# Patient Record
Sex: Male | Born: 1960 | Race: White | Hispanic: No | Marital: Single | Smoking: Current every day smoker
Health system: Southern US, Community
[De-identification: ages and names within clinical notes are randomized; demographics above are authoritative.]

## PROBLEM LIST (undated history)

## (undated) ENCOUNTER — Emergency Department (HOSPITAL_COMMUNITY): Discharge status: Absent without leave | Payer: Self-pay

## (undated) VITALS — BP 112/80 | HR 95 | Temp 97.9°F | Resp 18 | Ht 71.0 in | Wt 157.3 lb

## (undated) DIAGNOSIS — I639 Cerebral infarction, unspecified: Secondary | ICD-10-CM

## (undated) DIAGNOSIS — Z9119 Patient's noncompliance with other medical treatment and regimen: Secondary | ICD-10-CM

## (undated) DIAGNOSIS — F1410 Cocaine abuse, uncomplicated: Secondary | ICD-10-CM

## (undated) DIAGNOSIS — F1010 Alcohol abuse, uncomplicated: Secondary | ICD-10-CM

## (undated) DIAGNOSIS — Z91199 Patient's noncompliance with other medical treatment and regimen due to unspecified reason: Secondary | ICD-10-CM

## (undated) DIAGNOSIS — J939 Pneumothorax, unspecified: Secondary | ICD-10-CM

## (undated) DIAGNOSIS — I10 Essential (primary) hypertension: Secondary | ICD-10-CM

## (undated) DIAGNOSIS — I509 Heart failure, unspecified: Secondary | ICD-10-CM

## (undated) DIAGNOSIS — I2510 Atherosclerotic heart disease of native coronary artery without angina pectoris: Secondary | ICD-10-CM

## (undated) DIAGNOSIS — I219 Acute myocardial infarction, unspecified: Secondary | ICD-10-CM

## (undated) HISTORY — PX: SPLENECTOMY: SUR1306

## (undated) HISTORY — PX: LIVER SURGERY: SHX698

## (undated) HISTORY — PX: COLON SURGERY: SHX602

## (undated) HISTORY — PX: ABDOMINAL SURGERY: SHX537

## (undated) HISTORY — DX: Heart failure, unspecified: I50.9

---

## 2000-11-04 ENCOUNTER — Emergency Department (HOSPITAL_COMMUNITY): Admission: EM | Admit: 2000-11-04 | Discharge: 2000-11-04 | Payer: Self-pay | Admitting: Emergency Medicine

## 2000-11-04 ENCOUNTER — Encounter: Payer: Self-pay | Admitting: Emergency Medicine

## 2002-07-21 ENCOUNTER — Emergency Department (HOSPITAL_COMMUNITY): Admission: EM | Admit: 2002-07-21 | Discharge: 2002-07-21 | Payer: Self-pay | Admitting: *Deleted

## 2002-07-21 ENCOUNTER — Encounter: Payer: Self-pay | Admitting: *Deleted

## 2008-07-20 ENCOUNTER — Ambulatory Visit: Payer: Self-pay | Admitting: Cardiology

## 2011-05-02 ENCOUNTER — Emergency Department (HOSPITAL_COMMUNITY)
Admission: EM | Admit: 2011-05-02 | Discharge: 2011-05-03 | Disposition: A | Discharge status: Home / Self Care | Payer: Self-pay | Attending: Emergency Medicine | Admitting: Emergency Medicine

## 2011-05-02 ENCOUNTER — Encounter (HOSPITAL_COMMUNITY): Payer: Self-pay

## 2011-05-02 DIAGNOSIS — F1010 Alcohol abuse, uncomplicated: Secondary | ICD-10-CM | POA: Insufficient documentation

## 2011-05-02 DIAGNOSIS — R5381 Other malaise: Secondary | ICD-10-CM | POA: Insufficient documentation

## 2011-05-02 DIAGNOSIS — Z8673 Personal history of transient ischemic attack (TIA), and cerebral infarction without residual deficits: Secondary | ICD-10-CM | POA: Insufficient documentation

## 2011-05-02 DIAGNOSIS — R209 Unspecified disturbances of skin sensation: Secondary | ICD-10-CM | POA: Insufficient documentation

## 2011-05-02 DIAGNOSIS — F17200 Nicotine dependence, unspecified, uncomplicated: Secondary | ICD-10-CM | POA: Insufficient documentation

## 2011-05-02 DIAGNOSIS — R51 Headache: Secondary | ICD-10-CM | POA: Insufficient documentation

## 2011-05-02 DIAGNOSIS — I252 Old myocardial infarction: Secondary | ICD-10-CM | POA: Insufficient documentation

## 2011-05-02 DIAGNOSIS — R42 Dizziness and giddiness: Secondary | ICD-10-CM | POA: Insufficient documentation

## 2011-05-02 DIAGNOSIS — Z9089 Acquired absence of other organs: Secondary | ICD-10-CM | POA: Insufficient documentation

## 2011-05-02 DIAGNOSIS — R55 Syncope and collapse: Secondary | ICD-10-CM | POA: Insufficient documentation

## 2011-05-02 HISTORY — DX: Cerebral infarction, unspecified: I63.9

## 2011-05-02 HISTORY — DX: Acute myocardial infarction, unspecified: I21.9

## 2011-05-02 NOTE — ED Notes (Signed)
Pt states he can normally drink 48 beers in one day and since Tuesday he is only able to drink 4-6 beers and then feels drunk.   Pt states he has also been dizzy and his left arm feels "cold" at times.

## 2011-05-03 ENCOUNTER — Emergency Department (HOSPITAL_COMMUNITY): Payer: Self-pay

## 2011-05-03 LAB — CBC
HCT: 44.1 % (ref 39.0–52.0)
Hemoglobin: 15.3 g/dL (ref 13.0–17.0)
MCH: 31.7 pg (ref 26.0–34.0)
MCHC: 34.7 g/dL (ref 30.0–36.0)
MCV: 91.5 fL (ref 78.0–100.0)
Platelets: 149 10*3/uL — ABNORMAL LOW (ref 150–400)
RBC: 4.82 MIL/uL (ref 4.22–5.81)
RDW: 13.1 % (ref 11.5–15.5)
WBC: 5.4 10*3/uL (ref 4.0–10.5)

## 2011-05-03 LAB — DIFFERENTIAL
Basophils Absolute: 0 10*3/uL (ref 0.0–0.1)
Basophils Relative: 0 % (ref 0–1)
Eosinophils Absolute: 0.2 10*3/uL (ref 0.0–0.7)
Eosinophils Relative: 3 % (ref 0–5)
Lymphocytes Relative: 33 % (ref 12–46)
Lymphs Abs: 1.8 10*3/uL (ref 0.7–4.0)
Monocytes Absolute: 0.5 10*3/uL (ref 0.1–1.0)
Monocytes Relative: 10 % (ref 3–12)
Neutro Abs: 2.9 10*3/uL (ref 1.7–7.7)
Neutrophils Relative %: 54 % (ref 43–77)

## 2011-05-03 LAB — COMPREHENSIVE METABOLIC PANEL
ALT: 7 U/L (ref 0–53)
AST: 16 U/L (ref 0–37)
Albumin: 3.7 g/dL (ref 3.5–5.2)
Alkaline Phosphatase: 80 U/L (ref 39–117)
BUN: 10 mg/dL (ref 6–23)
CO2: 23 mEq/L (ref 19–32)
Calcium: 9.7 mg/dL (ref 8.4–10.5)
Chloride: 104 mEq/L (ref 96–112)
Creatinine, Ser: 0.86 mg/dL (ref 0.50–1.35)
GFR calc Af Amer: >90 mL/min (ref >90)
GFR calc non Af Amer: >90 mL/min (ref >90)
Glucose, Bld: 100 mg/dL — ABNORMAL HIGH (ref 70–99)
Potassium: 3.7 mEq/L (ref 3.5–5.1)
Sodium: 140 mEq/L (ref 135–145)
Total Bilirubin: 0.3 mg/dL (ref 0.3–1.2)
Total Protein: 7.1 g/dL (ref 6.0–8.3)

## 2011-05-03 LAB — ETHANOL: Alcohol, Ethyl (B): 200 mg/dL — ABNORMAL HIGH (ref 0–11)

## 2011-05-03 NOTE — ED Notes (Signed)
Pt transported to radiology.

## 2011-05-03 NOTE — ED Provider Notes (Signed)
History     CSN: [id number]  Arrival date & time 05/02/11  2334   First MD Initiated Contact with Patient 05/02/11 2353      Chief Complaint  Patient presents with  . Near Syncope    (Consider location/radiation/quality/duration/timing/severity/associated sxs/prior treatment) Patient is a 51 y.o. male presenting with weakness. The history is provided by the patient (the patient states that when he drinks about 8 beers he becomes weak and dizzy. Feels like he is drunkpatient states that he drinks about 48 beers a day. He states that he's been doing this for years. By then her last). No language interpreter was used.  Weakness The primary symptoms include headaches, dizziness and paresthesias. Primary symptoms do not include seizures or visual change. The symptoms began more than 1 week ago. The symptoms are unchanged. The neurological symptoms are diffuse. The symptoms occurred after standing up.  The headache is associated with paresthesias and weakness. The headache is not associated with visual change.   Dizziness also occurs with weakness.  Additional symptoms include weakness. Additional symptoms do not include hallucinations. Medical issues do not include seizures or cerebral vascular accident. Workup history does not include MRI.    Past Medical History  Diagnosis Date  . Stroke   . Myocardial infarction     Past Surgical History  Procedure Date  . Splenectomy   . Abdominal surgery   . Colon surgery     No family history on file.  History  Substance Use Topics  . Smoking status: Current Everyday Smoker -- 2.0 packs/day  . Smokeless tobacco: Not on file  . Alcohol Use: 60.0 oz/week    100 Cans of beer per week      Review of Systems  Constitutional: Negative for fatigue.  HENT: Negative for congestion, sinus pressure and ear discharge.   Eyes: Negative for discharge.  Respiratory: Negative for cough.   Cardiovascular: Negative for chest pain.    Gastrointestinal: Negative for abdominal pain and diarrhea.  Genitourinary: Negative for frequency and hematuria.  Musculoskeletal: Negative for back pain.  Skin: Negative for rash.  Neurological: Positive for dizziness, weakness, headaches and paresthesias. Negative for seizures.  Hematological: Negative.   Psychiatric/Behavioral: Negative for hallucinations.    Allergies  Review of patient's allergies indicates no known allergies.  Home Medications  No current outpatient prescriptions on file.  BP 148/99  Pulse 86  Temp(Src) 97.9 F (36.6 C) (Oral)  Resp 18  Ht 5' 11.5" (1.816 m)  Wt 160 lb (72.576 kg)  BMI 22.00 kg/m2  SpO2 99%  Physical Exam  Constitutional: He is oriented to person, place, and time. He appears well-developed.  HENT:  Head: Normocephalic and atraumatic.  Eyes: Conjunctivae and EOM are normal. No scleral icterus.  Neck: Neck supple. No thyromegaly present.  Cardiovascular: Normal rate and regular rhythm.  Exam reveals no gallop and no friction rub.   No murmur heard. Pulmonary/Chest: No stridor. He has no wheezes. He has no rales. He exhibits no tenderness.  Abdominal: He exhibits no distension. There is no tenderness. There is no rebound.  Musculoskeletal: Normal range of motion. He exhibits no edema.  Lymphadenopathy:    He has no cervical adenopathy.  Neurological: He is oriented to person, place, and time. Coordination normal.  Skin: No rash noted. No erythema.  Psychiatric: He has a normal mood and affect. His behavior is normal.    ED Course  Procedures (including critical care time)  Labs Reviewed  CBC - Abnormal; Notable for  the following:    Platelets 149 (*)    All other components within normal limits  COMPREHENSIVE METABOLIC PANEL - Abnormal; Notable for the following:    Glucose, Bld 100 (*)    All other components within normal limits  ETHANOL - Abnormal; Notable for the following:    Alcohol, Ethyl (B) 200 (*)    All other  components within normal limits  DIFFERENTIAL   Ct Head Wo Contrast  05/03/2011  *RADIOLOGY REPORT*  Clinical Data: Near-syncope  CT HEAD WITHOUT CONTRAST  Technique:  Contiguous axial images were obtained from the base of the skull through the vertex without contrast.  Comparison: None  Findings: Normal ventricular morphology. No midline shift or mass effect. Normal appearance of brain parenchyma. No intracranial hemorrhage, mass lesion, or acute infarction. Visualized paranasal sinuses and mastoid air cells clear. Bones unremarkable.  IMPRESSION: No acute intracranial abnormalities.  Original Report Authenticated By: Lollie Marrow, M.D.     1. Alcohol abuse       MDM  Alcohol abuse.           Benny Lennert, MD 05/03/11 [phone]

## 2013-03-07 ENCOUNTER — Inpatient Hospital Stay (HOSPITAL_COMMUNITY)
Admission: EM | Admit: 2013-03-07 | Discharge: 2013-03-09 | DRG: 287 | Disposition: A | Discharge status: Home / Self Care | Payer: Self-pay | Attending: Internal Medicine | Admitting: Internal Medicine

## 2013-03-07 ENCOUNTER — Emergency Department (HOSPITAL_COMMUNITY): Payer: Self-pay

## 2013-03-07 ENCOUNTER — Other Ambulatory Visit: Payer: Self-pay

## 2013-03-07 ENCOUNTER — Encounter (HOSPITAL_COMMUNITY): Payer: Self-pay | Admitting: Emergency Medicine

## 2013-03-07 DIAGNOSIS — R079 Chest pain, unspecified: Secondary | ICD-10-CM | POA: Diagnosis present

## 2013-03-07 DIAGNOSIS — R634 Abnormal weight loss: Secondary | ICD-10-CM

## 2013-03-07 DIAGNOSIS — I200 Unstable angina: Secondary | ICD-10-CM | POA: Diagnosis present

## 2013-03-07 DIAGNOSIS — I426 Alcoholic cardiomyopathy: Secondary | ICD-10-CM

## 2013-03-07 DIAGNOSIS — Z79899 Other long term (current) drug therapy: Secondary | ICD-10-CM

## 2013-03-07 DIAGNOSIS — Z8673 Personal history of transient ischemic attack (TIA), and cerebral infarction without residual deficits: Secondary | ICD-10-CM

## 2013-03-07 DIAGNOSIS — Z720 Tobacco use: Secondary | ICD-10-CM | POA: Diagnosis present

## 2013-03-07 DIAGNOSIS — I2510 Atherosclerotic heart disease of native coronary artery without angina pectoris: Principal | ICD-10-CM | POA: Diagnosis present

## 2013-03-07 DIAGNOSIS — I498 Other specified cardiac arrhythmias: Secondary | ICD-10-CM | POA: Diagnosis present

## 2013-03-07 DIAGNOSIS — F1010 Alcohol abuse, uncomplicated: Secondary | ICD-10-CM | POA: Diagnosis present

## 2013-03-07 DIAGNOSIS — F17200 Nicotine dependence, unspecified, uncomplicated: Secondary | ICD-10-CM | POA: Diagnosis present

## 2013-03-07 DIAGNOSIS — I252 Old myocardial infarction: Secondary | ICD-10-CM

## 2013-03-07 DIAGNOSIS — Z8249 Family history of ischemic heart disease and other diseases of the circulatory system: Secondary | ICD-10-CM

## 2013-03-07 DIAGNOSIS — Z23 Encounter for immunization: Secondary | ICD-10-CM

## 2013-03-07 LAB — CBC WITH DIFFERENTIAL/PLATELET
Eosinophils Absolute: 0.1 10*3/uL (ref 0.0–0.7)
Eosinophils Relative: 3 % (ref 0–5)
HCT: 44.6 % (ref 39.0–52.0)
Hemoglobin: 15.5 g/dL (ref 13.0–17.0)
Lymphs Abs: 1 10*3/uL (ref 0.7–4.0)
MCH: 32 pg (ref 26.0–34.0)
MCV: 92 fL (ref 78.0–100.0)
Monocytes Absolute: 0.4 10*3/uL (ref 0.1–1.0)
Monocytes Relative: 9 % (ref 3–12)
Platelets: 149 10*3/uL — ABNORMAL LOW (ref 150–400)
RBC: 4.85 MIL/uL (ref 4.22–5.81)

## 2013-03-07 LAB — COMPREHENSIVE METABOLIC PANEL
AST: 16 U/L (ref 0–37)
Albumin: 3.8 g/dL (ref 3.5–5.2)
Chloride: 102 mEq/L (ref 96–112)
Creatinine, Ser: 0.83 mg/dL (ref 0.50–1.35)
Potassium: 3.9 mEq/L (ref 3.5–5.1)
Total Bilirubin: 0.2 mg/dL — ABNORMAL LOW (ref 0.3–1.2)

## 2013-03-07 LAB — TROPONIN I: Troponin I: <0.3 ng/mL (ref <0.30)

## 2013-03-07 MED ORDER — SODIUM CHLORIDE 0.9 % IV SOLN: 250.0000 mL | INTRAVENOUS | Status: DC | PRN

## 2013-03-07 MED ORDER — HEPARIN BOLUS VIA INFUSION
2000.0000 [IU] | Freq: Once | INTRAVENOUS | Status: AC
  Administered 2013-03-07: 2000 [IU] via INTRAVENOUS
  Filled 2013-03-07: qty 2000

## 2013-03-07 MED ORDER — METOPROLOL TARTRATE 25 MG PO TABS
25.0000 mg | ORAL_TABLET | Freq: Two times a day (BID) | ORAL | Status: DC
  Administered 2013-03-07 – 2013-03-08 (×2): 25 mg via ORAL
  Filled 2013-03-07 (×3): qty 1

## 2013-03-07 MED ORDER — ONDANSETRON HCL 4 MG PO TABS: 4.0000 mg | ORAL_TABLET | Freq: Four times a day (QID) | ORAL | Status: DC | PRN

## 2013-03-07 MED ORDER — SODIUM CHLORIDE 0.9 % IJ SOLN: 3.0000 mL | INTRAMUSCULAR | Status: DC | PRN

## 2013-03-07 MED ORDER — PANTOPRAZOLE SODIUM 40 MG IV SOLR
40.0000 mg | INTRAVENOUS | Status: DC
  Administered 2013-03-07: 40 mg via INTRAVENOUS
  Filled 2013-03-07 (×3): qty 40

## 2013-03-07 MED ORDER — NICOTINE 21 MG/24HR TD PT24
21.0000 mg | MEDICATED_PATCH | Freq: Every day | TRANSDERMAL | Status: DC
  Administered 2013-03-07: 21 mg via TRANSDERMAL
  Filled 2013-03-07 (×3): qty 1

## 2013-03-07 MED ORDER — LORAZEPAM 2 MG/ML IJ SOLN: 1.0000 mg | Freq: Four times a day (QID) | INTRAMUSCULAR | Status: DC | PRN

## 2013-03-07 MED ORDER — MORPHINE SULFATE 2 MG/ML IJ SOLN
INTRAMUSCULAR | Status: AC
  Filled 2013-03-07: qty 1

## 2013-03-07 MED ORDER — ASPIRIN 81 MG PO CHEW
81.0000 mg | CHEWABLE_TABLET | ORAL | Status: AC
  Administered 2013-03-08: 81 mg via ORAL
  Filled 2013-03-07: qty 1

## 2013-03-07 MED ORDER — METOPROLOL TARTRATE 12.5 MG HALF TABLET
12.5000 mg | ORAL_TABLET | Freq: Two times a day (BID) | ORAL | Status: DC
  Administered 2013-03-07: 12.5 mg via ORAL
  Filled 2013-03-07 (×3): qty 1

## 2013-03-07 MED ORDER — SODIUM CHLORIDE 0.9 % IV SOLN: 1.0000 mL/kg/h | INTRAVENOUS | Status: DC

## 2013-03-07 MED ORDER — MORPHINE SULFATE 2 MG/ML IJ SOLN: 1.0000 mg | INTRAMUSCULAR | Status: DC | PRN

## 2013-03-07 MED ORDER — ACETAMINOPHEN 325 MG PO TABS: 650.0000 mg | ORAL_TABLET | Freq: Four times a day (QID) | ORAL | Status: DC | PRN

## 2013-03-07 MED ORDER — ADULT MULTIVITAMIN W/MINERALS CH
1.0000 | ORAL_TABLET | Freq: Every day | ORAL | Status: DC
  Administered 2013-03-07 – 2013-03-09 (×3): 1 via ORAL
  Filled 2013-03-07 (×3): qty 1

## 2013-03-07 MED ORDER — SODIUM CHLORIDE 0.9 % IV SOLN
INTRAVENOUS | Status: DC
  Administered 2013-03-07: 75 mL via INTRAVENOUS

## 2013-03-07 MED ORDER — ACETAMINOPHEN 650 MG RE SUPP: 650.0000 mg | Freq: Four times a day (QID) | RECTAL | Status: DC | PRN

## 2013-03-07 MED ORDER — ATORVASTATIN CALCIUM 20 MG PO TABS
20.0000 mg | ORAL_TABLET | Freq: Every day | ORAL | Status: DC
  Administered 2013-03-07: 20 mg via ORAL
  Filled 2013-03-07 (×2): qty 1

## 2013-03-07 MED ORDER — THIAMINE HCL 100 MG/ML IJ SOLN
100.0000 mg | Freq: Every day | INTRAMUSCULAR | Status: DC
  Filled 2013-03-07 (×3): qty 1

## 2013-03-07 MED ORDER — ONDANSETRON HCL 4 MG/2ML IJ SOLN: 4.0000 mg | Freq: Four times a day (QID) | INTRAMUSCULAR | Status: DC | PRN

## 2013-03-07 MED ORDER — HEPARIN BOLUS VIA INFUSION
4000.0000 [IU] | Freq: Once | INTRAVENOUS | Status: AC
  Administered 2013-03-07: 4000 [IU] via INTRAVENOUS

## 2013-03-07 MED ORDER — HEPARIN (PORCINE) IN NACL 100-0.45 UNIT/ML-% IJ SOLN
1100.0000 [IU]/h | INTRAMUSCULAR | Status: DC
  Administered 2013-03-07: 800 [IU]/h via INTRAVENOUS
  Administered 2013-03-08: 1000 [IU]/h via INTRAVENOUS
  Filled 2013-03-07 (×3): qty 250

## 2013-03-07 MED ORDER — FOLIC ACID 1 MG PO TABS
1.0000 mg | ORAL_TABLET | Freq: Every day | ORAL | Status: DC
  Administered 2013-03-07 – 2013-03-09 (×3): 1 mg via ORAL
  Filled 2013-03-07 (×3): qty 1

## 2013-03-07 MED ORDER — OXYCODONE HCL 5 MG PO TABS: 5.0000 mg | ORAL_TABLET | ORAL | Status: DC | PRN

## 2013-03-07 MED ORDER — ALBUTEROL SULFATE (5 MG/ML) 0.5% IN NEBU: 2.5000 mg | INHALATION_SOLUTION | RESPIRATORY_TRACT | Status: DC | PRN

## 2013-03-07 MED ORDER — ASPIRIN EC 325 MG PO TBEC
325.0000 mg | DELAYED_RELEASE_TABLET | Freq: Every day | ORAL | Status: DC
  Administered 2013-03-08 – 2013-03-09 (×2): 325 mg via ORAL
  Filled 2013-03-07 (×2): qty 1

## 2013-03-07 MED ORDER — MORPHINE SULFATE 2 MG/ML IJ SOLN
2.0000 mg | INTRAMUSCULAR | Status: DC | PRN
  Administered 2013-03-07: 2 mg via INTRAVENOUS

## 2013-03-07 MED ORDER — SODIUM CHLORIDE 0.9 % IJ SOLN
3.0000 mL | Freq: Two times a day (BID) | INTRAMUSCULAR | Status: DC
  Administered 2013-03-07 – 2013-03-08 (×2): 3 mL via INTRAVENOUS

## 2013-03-07 MED ORDER — NITROGLYCERIN 0.4 MG SL SUBL
0.4000 mg | SUBLINGUAL_TABLET | SUBLINGUAL | Status: DC | PRN
  Administered 2013-03-07: 0.4 mg via SUBLINGUAL
  Filled 2013-03-07: qty 25

## 2013-03-07 MED ORDER — INFLUENZA VAC SPLIT QUAD 0.5 ML IM SUSP
0.5000 mL | INTRAMUSCULAR | Status: AC
  Administered 2013-03-09: 0.5 mL via INTRAMUSCULAR
  Filled 2013-03-07: qty 0.5

## 2013-03-07 MED ORDER — LORAZEPAM 1 MG PO TABS: 1.0000 mg | ORAL_TABLET | Freq: Four times a day (QID) | ORAL | Status: DC | PRN

## 2013-03-07 MED ORDER — SODIUM CHLORIDE 0.9 % IV SOLN
Freq: Once | INTRAVENOUS | Status: AC
  Administered 2013-03-07: 08:00:00 via INTRAVENOUS

## 2013-03-07 MED ORDER — SODIUM CHLORIDE 0.9 % IJ SOLN: 3.0000 mL | Freq: Two times a day (BID) | INTRAMUSCULAR | Status: DC

## 2013-03-07 MED ORDER — VITAMIN B-1 100 MG PO TABS
100.0000 mg | ORAL_TABLET | Freq: Every day | ORAL | Status: DC
  Administered 2013-03-07 – 2013-03-09 (×3): 100 mg via ORAL
  Filled 2013-03-07 (×3): qty 1

## 2013-03-07 MED ORDER — ASPIRIN 81 MG PO CHEW
324.0000 mg | CHEWABLE_TABLET | Freq: Once | ORAL | Status: AC
  Administered 2013-03-07: 324 mg via ORAL
  Filled 2013-03-07: qty 4

## 2013-03-07 NOTE — Progress Notes (Signed)
ANTICOAGULATION CONSULT NOTE - Follow Up Consult  Pharmacy Consult for Heparin Indication: chest pain/ACS  No Known Allergies  Patient Measurements: Height: 5' 11.5" (181.6 cm) Weight: 145 lb 1 oz (65.8 kg) IBW/kg (Calculated) : 76.45 Heparin Dosing Weight: 66 kg  Vital Signs: Temp: 98.1 F (36.7 C) (12/07 1326) Temp src: Oral (12/07 1326) BP: 140/85 mmHg (12/07 1800) Pulse Rate: 77 (12/07 1326)  Labs:  Recent Labs  03/07/13 0805 03/07/13 1411 03/07/13 1840  HGB 15.5  --   --   HCT 44.6  --   --   PLT 149*  --   --   HEPARINUNFRC  --   --  <0.10*  CREATININE 0.83  --   --   TROPONINI <0.30 <0.30  --     Estimated Creatinine Clearance: 94.7 ml/min (by C-G formula based on Cr of 0.83).  Assessment: 52 YOM transferred from [facility] for chest pain, started IV heparin this morning, heparin level undetectable on 800 units/hr. No issue with infusion, no bleeding per RN, plan for cath tomorrow.  Goal of Therapy:  Heparin level 0.3-0.7 units/ml Monitor platelets by anticoagulation protocol: Yes   Plan:  - Rebolus 200 units - Increase heparin rate to 1000 units/hr - f/u 6 hr heparin level at 0200  Bayard Hugger, PharmD, BCPS  Clinical Pharmacist  Pager: [phone]   03/07/2013,7:55 PM

## 2013-03-07 NOTE — ED Notes (Signed)
Verified with hospitalist concerning metoprolol dose and admin time. Hospitalist give verbal order to given pt PO dose.Pt tolerated well.

## 2013-03-07 NOTE — ED Notes (Addendum)
Pt still receiving a bolus. Pt bp trending back up slowly.Pt still diaphoretic and pale. Pt given a cold wash rag. Pt alert and oriented.

## 2013-03-07 NOTE — ED Notes (Signed)
Complain of chest pain that started about an hour ago. States he was working when pain started. Also, states he drank " pretty heavy " last night. Denies other symptoms

## 2013-03-07 NOTE — ED Provider Notes (Signed)
CSN: [id number]     Arrival date & time 03/07/13  7829 History  This chart was scribed for Doug Sou, MD by Dorothey Baseman, ED Scribe. This patient was seen in room APA02/[facility]02 and the patient's care was started at 7:55 AM.    Chief Complaint  Patient presents with  . Chest Pain   The history is provided by the patient. No language interpreter was used.   HPI Comments: Craig Perry is a 52 y.o. male with a history of stroke and MI who presents to the Emergency Department complaining of a sharp, tight pain to the chest onset about 2 hours ago while he was at work. He states the pain has been slightly improving since onset. He denies any exacerbating or alleviating factors. He reports some associated shortness of breath that he states is normal for him, but states that it was somewhat exacerbated around the onset of symptoms. He reports some associated nausea that was relieved with drinking water. He denies diaphoresis. No treatment prior to coming here. Patient reports a history of splenectomy and abdominal surgery. Patient reports a familial history of cardiac problems and MI (father had an MI in his 54s and brothers had MIs in their 22s). Patient is a current every day smoker, 3 PPD, and a heavy, every day drinker (last drink around 5.5 hours ago). He denies any regular medication use or allergies to medications.   Past Medical History  Diagnosis Date  . Stroke   . Myocardial infarction    Past Surgical History  Procedure Laterality Date  . Splenectomy    . Abdominal surgery    . Colon surgery     No family history on file. History  Substance Use Topics  . Smoking status: Current Every Day Smoker -- 2.00 packs/day  . Smokeless tobacco: Not on file  . Alcohol Use: 60.0 oz/week    100 Cans of beer per week   smokes 3 packs per day  Review of Systems  Constitutional: Negative.  Negative for diaphoresis.  HENT: Negative.   Respiratory: Positive for chest tightness and shortness  of breath.   Cardiovascular: Positive for chest pain.  Gastrointestinal: Positive for nausea.  Musculoskeletal: Negative.   Skin: Negative.   Neurological: Negative.   Psychiatric/Behavioral: Negative.     Allergies  Review of patient's allergies indicates no known allergies.  Home Medications  No current outpatient prescriptions on file.  Triage Vitals: BP 149/87  Pulse 120  Resp 20  Ht 5' 11.5" (1.816 m)  Wt 145 lb (65.772 kg)  BMI 19.94 kg/m2  SpO2 100%  Physical Exam  Nursing note and vitals reviewed. Constitutional: He appears well-developed and well-nourished.  HENT:  Head: Normocephalic and atraumatic.  Mouth/Throat: Oropharynx is clear and moist.  Poor dentition.   Eyes: Conjunctivae are normal. Pupils are equal, round, and reactive to light.  Neck: Neck supple. No tracheal deviation present. No thyromegaly present.  Cardiovascular: Regular rhythm and normal heart sounds.  Exam reveals no gallop and no friction rub.   No murmur heard. Tachycardic.   Pulmonary/Chest: Effort normal and breath sounds normal. No respiratory distress. He has no wheezes. He has no rales.  Abdominal: Soft. Bowel sounds are normal. He exhibits no distension. There is no tenderness.  Musculoskeletal: Normal range of motion. He exhibits no edema and no tenderness.  Neurological: He is alert. Coordination normal.  Skin: Skin is warm and dry. No rash noted.  Psychiatric: He has a normal mood and affect.  ED Course  Procedures (including critical care time) 8:30 AM patient became lightheaded and hypotensive after treatment with one sublingual nitroglycerin and aspirin. He remained alert Glasgow Coma Score 15 mildly diaphoretic. He received an intravenous fluid bolus 250 mL normal saline and felt improved. At 8:42 AM he received morphine 2 mg IV for continued chest pain. 855 AM he is asymptomatic pain free. He appears comfortable DIAGNOSTIC STUDIES: Oxygen Saturation is 100% on room air,  normal by my interpretation.    COORDINATION OF CARE: 8:01 AM- Ordered an EKG and discussed that results were normal. Will order blood labs and a chest x-ray. Will order IV fluids, aspirin, and Nitrostat to manage symptoms. Discussed treatment plan with patient at bedside and patient verbalized agreement.   8:38 AM- Upon recheck, patient reports that his symptoms appear to be improving, but that the chest discomfort persists. Will order morphine to manage symptoms. Discussed treatment plan with patient at bedside and patient verbalized agreement.    Labs Review Labs Reviewed  COMPREHENSIVE METABOLIC PANEL - Abnormal; Notable for the following:    Glucose, Bld 141 (*)    Total Bilirubin 0.2 (*)    All other components within normal limits  CBC WITH DIFFERENTIAL - Abnormal; Notable for the following:    Platelets 149 (*)    All other components within normal limits  TROPONIN I   Imaging Review Dg Chest Portable 1 View  03/07/2013   CLINICAL DATA:  Chest pain.  EXAM: PORTABLE CHEST - 1 VIEW  COMPARISON:  08/15/2009  FINDINGS: The heart size and mediastinal contours are within normal limits. Both lungs are clear. The visualized skeletal structures are unremarkable.  IMPRESSION: Normal exam.   Electronically Signed   By: Geanie Cooley M.D.   On: 03/07/2013 08:43  Chest xray viewed by me  EKG Interpretation   None       Date: 03/07/2013  Rate: 115  Rhythm: sinus tachycardia  QRS Axis: normal  Intervals: normal  ST/T Wave abnormalities: normal  Conduction Disutrbances:none  Narrative Interpretation:   Old EKG Reviewed: none available   MDM  No diagnosis found. Symptoms highly suspicious for angina. Heart score equals 4 spoke with Dr.Ghimire who will arrange for inpatient stay Diagnosis #1 unstable angina #2 hyperglycemia #3 tobacco abuse  I personally performed the services described in this documentation, which was scribed in my presence. The recorded information has been  reviewed and considered.    Doug Sou, MD 03/07/13 [phone]

## 2013-03-07 NOTE — Progress Notes (Signed)
ANTICOAGULATION CONSULT NOTE - Initial Consult  Pharmacy Consult for Heparin Indication: chest pain/ACS  No Known Allergies  Patient Measurements: Height: 5' 11.5" (181.6 cm) Weight: 145 lb (65.772 kg) IBW/kg (Calculated) : 76.45  Vital Signs: Temp: 98.2 F (36.8 C) (12/07 0803) Temp src: Oral (12/07 0803) BP: 124/84 mmHg (12/07 1000) Pulse Rate: 95 (12/07 1000)  Labs:  Recent Labs  03/07/13 0805  HGB 15.5  HCT 44.6  PLT 149*  CREATININE 0.83  TROPONINI <0.30   Estimated Creatinine Clearance: 94.7 ml/min (by C-G formula based on Cr of 0.83).  Medical History: Past Medical History  Diagnosis Date  . Stroke   . Myocardial infarction    Medications:  Infusions:  . heparin    . heparin      Assessment: 52yo male with h/o stroke and MI presents to ED c/o chest pain.  Asked to initiate Heparin for ACS.  Goal of Therapy:  Heparin level 0.3-0.7 units/ml Monitor platelets by anticoagulation protocol: Yes   Plan:  Heparin 4000 units IV bolus x 1 now Heparin infusion at 12 units/Kg/Hr Check heparin level in 6-8 hrs Monitor CBC  Shareef Eddinger A 03/07/2013,10:26 AM

## 2013-03-07 NOTE — ED Notes (Signed)
Went in to reassess pt after first nitro. Pt pale,diaphoretic and reports feeling weak. Pt BP 65/47. EDP aware and at bedside. EDP reported to bolus fluids.

## 2013-03-07 NOTE — Consult Note (Signed)
Referring Physician: [facility] Reason for Consultation: CP   HPI:  Craig Perry is a 52 y.o. male with a h/o ongoing tobacco abuse (3 packs a day), alcohol abuse (approximately 12-36 cans of beer a day, with some liquor at times), strong FHX of CAD whom we are asked to see for CP.   Went to [place] ~ 4 years ago with CP. Had negative stress test. 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 Patient describes the pain as mild. However this morning, he started having severe retrosternal chest discomfort which he describes as "someone took a hammer to my chest". Patient claims that the pain never resolved, "eased up somewhat", but then reoccurred every few minutes, with the same in severity, this lasted approximately 45 minutes, as a result he was brought to the emergency room for further evaluation and treatment. Pain associated with left arm numbness, SOB and daiphoresis.  When he came to the emergency room, patient was still having chest discomfort, he was given nitroglycerin which did not completely alleviate his pain but in fact made him hypotensive. He was given IV fluids, then given IV morphine which completely resolved his pain. Transferred from [facility] to [facility]. Now pain free.   ECG and first set of CE are negative.   Reports 40 pound unintentional weight loss over past 6 months with severe night sweats every night.    Review of Systems:     Cardiac Review of Systems: {Y] = yes [ ]  = no  Chest Pain [  y  ]  Resting SOB [ y  ] Exertional SOB  [  ]  Orthopnea [  ]   Pedal Edema [   ]    Palpitations [  ] Syncope  [  ]   Presyncope [   ]  General Review of Systems: [Y] = yes [  ]=no Constitional: recent weight change [  y]; anorexia [  ]; fatigue [  ]; nausea [  ]; night sweats [ y ]; fever [  ]; or chills [  ];                                                                                                                                           Dental: poor dentition[ y ];   Eye : blurred vision [  ]; diplopia [   ]; vision changes [  ];  Amaurosis fugax[  ]; Resp: cough [  ];  wheezing[  ];  hemoptysis[  ]; shortness of breath[y  ]; paroxysmal nocturnal dyspnea[  ]; dyspnea on exertion[  ]; or orthopnea[  ];  GI:  gallstones[  ], vomiting[  ];  dysphagia[  ]; melena[  ];  hematochezia [  ];  heartburn[  ];    GU: kidney stones [  ]; hematuria[  ];   dysuria [  ];  nocturia[  ];  history of     obstruction [  ];                 Skin: rash, swelling[  ];, hair loss[  ];  peripheral edema[  ];  or itching[  ]; Musculosketetal: myalgias[  ];  joint swelling[  ];  joint erythema[  ];  joint pain[  ];  back pain[  ];  Heme/Lymph: bruising[  ];  bleeding[  ];  anemia[  ];  Neuro: TIA[  ];  headaches[  ];  stroke[  ];  vertigo[  ];  seizures[  ];   paresthesias[  ];  difficulty walking[  ];  Psych:depression[  ]; anxiety[  ];  Endocrine: diabetes[  ];  thyroid dysfunction[  ];  Other:  Past Medical History  Diagnosis Date  . Stroke   . Myocardial infarction     No prescriptions prior to admission     . [START ON 03/08/2013] aspirin EC  325 mg Oral Daily  . atorvastatin  20 mg Oral q1800  . folic acid  1 mg Oral Daily  . [START ON 03/08/2013] influenza vac split quadrivalent PF  0.5 mL Intramuscular Tomorrow-1000  . metoprolol tartrate  12.5 mg Oral BID  . multivitamin with minerals  1 tablet Oral Daily  . pantoprazole (PROTONIX) IV  40 mg Intravenous Q24H  . sodium chloride  3 mL Intravenous Q12H  . thiamine  100 mg Oral Daily   Or  . thiamine  100 mg Intravenous Daily    Infusions: . sodium chloride 75 mL (03/07/13 1426)  . heparin 800 Units/hr (03/07/13 1047)    No Known Allergies  History   Social History  . Marital Status: Legally Separated    Spouse Name: N/A    Number of Children: N/A  . Years of  Education: N/A   Occupational History  . Not on file.   Social History Main Topics  . Smoking status: Current Every Day Smoker -- 2.00 packs/day  . Smokeless tobacco: Not on file  . Alcohol Use: 60.0 oz/week    100 Cans of beer per week  . Drug Use: No  . Sexual Activity:    Other Topics Concern  . Not on file   Social History Narrative  . No narrative on file    No family history on file.  PHYSICAL EXAM: Filed Vitals:   03/07/13 1326  BP: 146/88  Pulse: 77  Temp: 98.1 F (36.7 C)  Resp: 20     Intake/Output Summary (Last 24 hours) at 03/07/13 1514 Last data filed at 03/07/13 1426  Gross per 24 hour  Intake    253 ml  Output      0 ml  Net    253 ml    General:  Chronically ill appearing. No respiratory difficulty HEENT: normal x for poor dentition. longbeard Neck: supple. no JVD. Carotids 2+ bilat; no bruits. No lymphadenopathy or thryomegaly appreciated. Cor: PMI nondisplaced. Regular rate & rhythm. No rubs, gallops or murmurs. Lungs: clear with decreased air movement throughout Abdomen: soft, nontender, nondistended. No hepatosplenomegaly. No bruits or masses. Good bowel sounds. Extremities: no cyanosis, clubbing, rash, edema Neuro: alert & oriented x 3, cranial nerves grossly intact. moves all 4 extremities w/o difficulty. Affect pleasant.  ECG: Sinus tach 118 No ST-T wave abnormalities.    Results  for orders placed during the hospital encounter of 03/07/13 (from the past 24 hour(s))  COMPREHENSIVE METABOLIC PANEL     Status: Abnormal   Collection Time    03/07/13  8:05 AM      Result Value Range   Sodium 139  135 - 145 mEq/L   Potassium 3.9  3.5 - 5.1 mEq/L   Chloride 102  96 - 112 mEq/L   CO2 27  19 - 32 mEq/L   Glucose, Bld 141 (*) 70 - 99 mg/dL   BUN 12  6 - 23 mg/dL   Creatinine, Ser 1.61  0.50 - 1.35 mg/dL   Calcium 9.4  8.4 - 09.6 mg/dL   Total Protein 7.2  6.0 - 8.3 g/dL   Albumin 3.8  3.5 - 5.2 g/dL   AST 16  0 - 37 U/L   ALT 8  0 - 53  U/L   Alkaline Phosphatase 82  39 - 117 U/L   Total Bilirubin 0.2 (*) 0.3 - 1.2 mg/dL   GFR calc non Af Amer >90  >90 mL/min   GFR calc Af Amer >90  >90 mL/min  CBC WITH DIFFERENTIAL     Status: Abnormal   Collection Time    03/07/13  8:05 AM      Result Value Range   WBC 4.2  4.0 - 10.5 K/uL   RBC 4.85  4.22 - 5.81 MIL/uL   Hemoglobin 15.5  13.0 - 17.0 g/dL   HCT 04.5  40.9 - 81.1 %   MCV 92.0  78.0 - 100.0 fL   MCH 32.0  26.0 - 34.0 pg   MCHC 34.8  30.0 - 36.0 g/dL   RDW 91.4  78.2 - 95.6 %   Platelets 149 (*) 150 - 400 K/uL   Neutrophils Relative % 64  43 - 77 %   Neutro Abs 2.7  1.7 - 7.7 K/uL   Lymphocytes Relative 24  12 - 46 %   Lymphs Abs 1.0  0.7 - 4.0 K/uL   Monocytes Relative 9  3 - 12 %   Monocytes Absolute 0.4  0.1 - 1.0 K/uL   Eosinophils Relative 3  0 - 5 %   Eosinophils Absolute 0.1  0.0 - 0.7 K/uL   Basophils Relative 0  0 - 1 %   Basophils Absolute 0.0  0.0 - 0.1 K/uL  TROPONIN I     Status: None   Collection Time    03/07/13  8:05 AM      Result Value Range   Troponin I <0.30  <0.30 ng/mL   Dg Chest Portable 1 View  03/07/2013   CLINICAL DATA:  Chest pain.  EXAM: PORTABLE CHEST - 1 VIEW  COMPARISON:  08/15/2009  FINDINGS: The heart size and mediastinal contours are within normal limits. Both lungs are clear. The visualized skeletal structures are unremarkable.  IMPRESSION: Normal exam.   Electronically Signed   By: Geanie Cooley M.D.   On: 03/07/2013 08:43     ASSESSMENT: 1. Unstable angina 2. Tobacco use, ongoing 3. ETOH abuse 4. 40-pound unintentional weight loss with night sweats  PLAN/DISCUSSION:  Chest pain concerning for [place]. Agree with heparin, asa, statin and b-blocker. Will plan cath tomorrow. I discussed procedure with him and he is OK to proceed. Pre-cath orders written.  Once cath complete, would consider CT C/A/P to further assess for underlying malignancy given weight loss and night sweats. I will check HIV and hepatitis panels.    Counseled on  need to stop smoking and drinking. Would consider CIWA protocol.  Truman Hayward 3:34 PM

## 2013-03-07 NOTE — H&P (Signed)
PATIENT DETAILS Name: Craig Perry Age: 52 y.o. Sex: male Date of Birth: May 01, 1960 Admit Date: 03/07/2013 PCP:No PCP Per Patient   CHIEF COMPLAINT:  Chest pain  HPI: Craig Perry is a 52 y.o. male with a Past Medical History of tobacco abuse (3 packs a day), alcohol abuse (approximately 36 cans of beer a day, with some liquor at times),? History of CAD approximately 7 years ago (not able to provide more history), with significant family history of CAD (see family history below)   who presents today with the above noted complaint. 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 Patient describes the pain as mild. 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 Patient claims that the pain never resolved, "eased up somewhat", but then reoccurred every few minutes, with the same in severity, this lasted approximately 45 minutes, as a result he was brought to the emergency room for further evaluation and treatment. Patient claims that the pain made his left arm feels heavy/numb. He had associated shortness of breath, and mild diaphoresis. He had no nausea or vomiting. When he came to the emergency room, patient was still having chest discomfort, he was given nitroglycerin which did not completely alleviate his pain but in fact made him hypotensive. He was given IV fluids, then given IV morphine which completely resolved his pain. I was subsequently asked to admit this patient for further evaluation and treatment.  During my evaluation, patient seemed comfortable, he was not having any chest pain. He claims that ever since he received IV morphine, his chest pain has completely resolved and has not  recurred.  ALLERGIES:  No Known Allergies  PAST MEDICAL HISTORY: Past Medical History  Diagnosis Date  . Stroke   . Myocardial infarction     PAST SURGICAL HISTORY: Past Surgical History  Procedure Laterality Date  . Splenectomy    . Abdominal surgery    . Colon surgery      MEDICATIONS AT HOME: Prior to Admission medications   Not on File    FAMILY HISTORY: 2 older brothers with CAD-one recently had CABG, the other one had a PCI recently.   mother with CAD and history of PCI Father with CAD- claims that the patient's father died after a MI  SOCIAL HISTORY:  reports that he has been smoking.  He does not have any smokeless tobacco history on file. He reports that he drinks about 60.0 ounces of alcohol per week. He reports that he does not use illicit drugs.  REVIEW OF SYSTEMS:  Constitutional:   No  weight loss, night sweats,  Fevers, chills, fatigue.  HEENT:    No headaches, Difficulty swallowing,Tooth/dental problems,Sore throat,  No sneezing, itching, ear ache, nasal congestion, post nasal drip,   Cardio-vascular: No Orthopnea, PND, swelling in lower extremities, anasarca, dizziness, palpitations  GI:  No heartburn, indigestion, abdominal pain, nausea, vomiting, diarrhea, change in   bowel habits, loss of appetite  Resp: No shortness of breath with exertion or at rest.  No excess mucus, no productive cough, No non-productive cough,  No coughing up of blood.No change in color of mucus.No wheezing.No chest wall deformity  Skin:  no rash or lesions.  GU:  no dysuria, change in  color of urine, no urgency or frequency.  No flank pain.  Musculoskeletal: No joint pain or swelling.  No decreased range of motion.  No back pain.  Psych: No change in mood or affect. No depression or anxiety.  No memory loss.   PHYSICAL EXAM: Blood pressure 124/84, pulse 95, temperature 98.2 F (36.8 C), temperature source Oral, resp. rate 20, height 5' 11.5" (1.816 m), weight  65.772 kg (145 lb), SpO2 97.00%.  General appearance :Awake, alert, not in any distress. Speech Clear. Not toxic Looking. Disheveled and unkempt. HEENT: Atraumatic and Normocephalic, pupils equally reactive to light and accomodation Neck: supple, no JVD. No cervical lymphadenopathy.  Chest:Good air entry bilaterally, no added sounds  CVS: S1 S2 regular, no murmurs.  Abdomen: Bowel sounds present, Non tender and not distended with no gaurding, rigidity or rebound. Extremities: B/L Lower Ext shows no edema, both legs are warm to touch Neurology: Awake alert, and oriented X 3, CN II-XII intact, Non focal Skin:No Rash Wounds:N/A  LABS ON ADMISSION:   Recent Labs  03/07/13 0805  NA 139  K 3.9  CL 102  CO2 27  GLUCOSE 141*  BUN 12  CREATININE 0.83  CALCIUM 9.4    Recent Labs  03/07/13 0805  AST 16  ALT 8  ALKPHOS 82  BILITOT 0.2*  PROT 7.2  ALBUMIN 3.8   No results found for this basename: LIPASE, AMYLASE,  in the last 72 hours  Recent Labs  03/07/13 0805  WBC 4.2  NEUTROABS 2.7  HGB 15.5  HCT 44.6  MCV 92.0  PLT 149*    Recent Labs  03/07/13 0805  TROPONINI <0.30   No results found for this basename: DDIMER,  in the last 72 hours No components found with this basename: POCBNP,    RADIOLOGIC STUDIES ON ADMISSION: Dg Chest Portable 1 View  03/07/2013   CLINICAL DATA:  Chest pain.  EXAM: PORTABLE CHEST - 1 VIEW  COMPARISON:  08/15/2009  FINDINGS: The heart size and mediastinal contours are within normal limits. Both lungs are clear. The visualized skeletal structures are unremarkable.  IMPRESSION: Normal exam.   Electronically Signed   By: Geanie Cooley M.D.   On: 03/07/2013 08:43     EKG: Independently reviewed.  sinus tachycardia ASSESSMENT AND PLAN: Present on Admission:  . Unstable angina  - Patient the symptoms are suggestive of unstable angina, he has significant family history of CAD and is a heavy smoker. He has had these intermittent chest pains  for a month, and now is coming in with severe chest pain. EKG and initial troponin negative. Case was discussed with Dr. Nicholes Mango, cardiology on call who suggested that given the patient's symptoms, patient be transferred to [facility] cardiology evaluation, he also suggested that we place the patient on a heparin infusion. Given patient's symptoms, family history, it may be prudent to proceed directly with a cardiac catheterization. Along with heparin infusion, patient will be placed on aspirin, statin and low-dose beta blockers if the blood pressure tolerates. - 2-D echocardiogram, fasting lipid panel for tomorrow morning, and A1c have been ordered. - Since patient is currently stable, with no active chest pain, he will be transferred to a telemetry unit at [facility]. - Above plan was discussed with the patient and family and they were in agreement. - Case was also discussed with Dr. York Spaniel- excepting hospitalist at [facility]. He will notify cardiology of the patient's arrival to [facility].   .  Tobacco abuse - as offered a nicotine transdermal patch, patient refuses. Consult, however do not think the patient has any desire to quit at present.  Marland Kitchen ETOH abuse - When the patient is not working, he claims to drink approximately 36 cans of beer on a daily basis, he also drinks liquor on top of that. Patient when working, drinks approximately 18-24 cans of beer on a daily basis. He however claims, then he can go for 2-3 days without any withdrawal symptoms. - In any event, patient will be placed on Ativan per CIWA protocol. He will be monitored for withdrawal symptoms.  Further plan will depend as patient's clinical course evolves and further radiologic and laboratory data become available. Patient will be monitored closely.  Above noted plan was discussed with patient/family  they were in agreement with the treatment plan and transfer to [facility]  DVT Prophylaxis: Not needed as the patient will be on heparin infusion  Code Status: Full Code  Total time spent for admission equals 45 minutes.  [facility] Pager [phone]  If 7PM-7AM, please contact night-coverage [email or web address] Password [email or web address] 03/07/2013, 10:04 AM

## 2013-03-08 ENCOUNTER — Inpatient Hospital Stay (HOSPITAL_COMMUNITY): Payer: Self-pay

## 2013-03-08 ENCOUNTER — Encounter (HOSPITAL_COMMUNITY)
Admission: EM | Disposition: A | Discharge status: Home / Self Care | Payer: Self-pay | Source: Home / Self Care | Attending: Internal Medicine

## 2013-03-08 DIAGNOSIS — I200 Unstable angina: Secondary | ICD-10-CM

## 2013-03-08 DIAGNOSIS — R079 Chest pain, unspecified: Secondary | ICD-10-CM

## 2013-03-08 DIAGNOSIS — F17200 Nicotine dependence, unspecified, uncomplicated: Secondary | ICD-10-CM

## 2013-03-08 DIAGNOSIS — I426 Alcoholic cardiomyopathy: Secondary | ICD-10-CM

## 2013-03-08 DIAGNOSIS — R072 Precordial pain: Secondary | ICD-10-CM

## 2013-03-08 DIAGNOSIS — R634 Abnormal weight loss: Secondary | ICD-10-CM

## 2013-03-08 DIAGNOSIS — F1010 Alcohol abuse, uncomplicated: Secondary | ICD-10-CM

## 2013-03-08 DIAGNOSIS — I2510 Atherosclerotic heart disease of native coronary artery without angina pectoris: Secondary | ICD-10-CM

## 2013-03-08 HISTORY — PX: LEFT HEART CATHETERIZATION WITH CORONARY ANGIOGRAM: SHX5451

## 2013-03-08 LAB — HEMOGLOBIN A1C: Hgb A1c MFr Bld: 5.8 % — ABNORMAL HIGH (ref <5.7)

## 2013-03-08 LAB — LIPID PANEL
LDL Cholesterol: 94 mg/dL (ref 0–99)
Triglycerides: 155 mg/dL — ABNORMAL HIGH (ref <150)

## 2013-03-08 LAB — PROTIME-INR
INR: 0.94 (ref 0.00–1.49)
Prothrombin Time: 12.4 seconds (ref 11.6–15.2)

## 2013-03-08 LAB — LACTATE DEHYDROGENASE: LDH: 135 U/L (ref 94–250)

## 2013-03-08 LAB — BASIC METABOLIC PANEL
BUN: 15 mg/dL (ref 6–23)
Chloride: 103 mEq/L (ref 96–112)
GFR calc Af Amer: >90 mL/min (ref >90)
Glucose, Bld: 103 mg/dL — ABNORMAL HIGH (ref 70–99)
Potassium: 3.9 mEq/L (ref 3.5–5.1)

## 2013-03-08 LAB — HIV ANTIBODY (ROUTINE TESTING W REFLEX): HIV: NONREACTIVE

## 2013-03-08 LAB — HEPARIN LEVEL (UNFRACTIONATED)
Heparin Unfractionated: 0.37 IU/mL (ref 0.30–0.70)
Heparin Unfractionated: 0.27 IU/mL — ABNORMAL LOW (ref 0.30–0.70)

## 2013-03-08 LAB — CBC
HCT: 41.6 % (ref 39.0–52.0)
Hemoglobin: 14.2 g/dL (ref 13.0–17.0)
Platelets: 127 10*3/uL — ABNORMAL LOW (ref 150–400)
RDW: 13 % (ref 11.5–15.5)
WBC: 6.3 10*3/uL (ref 4.0–10.5)

## 2013-03-08 LAB — HEPATITIS PANEL, ACUTE
HCV Ab: NEGATIVE
Hep A IgM: NONREACTIVE

## 2013-03-08 SURGERY — LEFT HEART CATHETERIZATION WITH CORONARY ANGIOGRAM
Anesthesia: LOCAL

## 2013-03-08 MED ORDER — LIDOCAINE HCL (PF) 1 % IJ SOLN
INTRAMUSCULAR | Status: AC
  Filled 2013-03-08: qty 30

## 2013-03-08 MED ORDER — ASPIRIN 325 MG PO TBEC: 325.0000 mg | DELAYED_RELEASE_TABLET | Freq: Every day | ORAL | Status: DC

## 2013-03-08 MED ORDER — NICOTINE 21 MG/24HR TD PT24: 21.0000 mg | MEDICATED_PATCH | Freq: Every day | TRANSDERMAL | Status: DC

## 2013-03-08 MED ORDER — HEPARIN (PORCINE) IN NACL 2-0.9 UNIT/ML-% IJ SOLN
INTRAMUSCULAR | Status: AC
  Filled 2013-03-08: qty 1000

## 2013-03-08 MED ORDER — SODIUM CHLORIDE 0.9 % IV SOLN
1.0000 mL/kg/h | INTRAVENOUS | Status: AC
  Administered 2013-03-08: 1 mL/kg/h via INTRAVENOUS

## 2013-03-08 MED ORDER — MIDAZOLAM HCL 2 MG/2ML IJ SOLN
INTRAMUSCULAR | Status: AC
  Filled 2013-03-08: qty 2

## 2013-03-08 MED ORDER — VERAPAMIL HCL 2.5 MG/ML IV SOLN
INTRAVENOUS | Status: AC
  Filled 2013-03-08: qty 2

## 2013-03-08 MED ORDER — LOSARTAN POTASSIUM 25 MG PO TABS
25.0000 mg | ORAL_TABLET | Freq: Every day | ORAL | Status: DC
  Administered 2013-03-08 – 2013-03-09 (×2): 25 mg via ORAL
  Filled 2013-03-08 (×2): qty 1

## 2013-03-08 MED ORDER — HEPARIN SODIUM (PORCINE) 1000 UNIT/ML IJ SOLN
INTRAMUSCULAR | Status: AC
  Filled 2013-03-08: qty 1

## 2013-03-08 MED ORDER — LOSARTAN POTASSIUM 25 MG PO TABS: 25.0000 mg | ORAL_TABLET | Freq: Every day | ORAL | Status: DC

## 2013-03-08 MED ORDER — SODIUM CHLORIDE 0.9 % IJ SOLN: 3.0000 mL | Freq: Two times a day (BID) | INTRAMUSCULAR | Status: DC

## 2013-03-08 MED ORDER — SODIUM CHLORIDE 0.9 % IJ SOLN: 3.0000 mL | INTRAMUSCULAR | Status: DC | PRN

## 2013-03-08 MED ORDER — FOLIC ACID 1 MG PO TABS: 1.0000 mg | ORAL_TABLET | Freq: Every day | ORAL | Status: DC

## 2013-03-08 MED ORDER — IOHEXOL 350 MG/ML SOLN
80.0000 mL | Freq: Once | INTRAVENOUS | Status: AC | PRN
  Administered 2013-03-08: 80 mL via INTRAVENOUS

## 2013-03-08 MED ORDER — FENTANYL CITRATE 0.05 MG/ML IJ SOLN
INTRAMUSCULAR | Status: AC
  Filled 2013-03-08: qty 2

## 2013-03-08 MED ORDER — NITROGLYCERIN 0.2 MG/ML ON CALL CATH LAB
INTRAVENOUS | Status: AC
  Filled 2013-03-08: qty 1

## 2013-03-08 MED ORDER — CARVEDILOL 3.125 MG PO TABS: 3.1250 mg | ORAL_TABLET | Freq: Two times a day (BID) | ORAL | Status: DC

## 2013-03-08 MED ORDER — SODIUM CHLORIDE 0.9 % IV SOLN: 250.0000 mL | INTRAVENOUS | Status: DC | PRN

## 2013-03-08 MED ORDER — THIAMINE HCL 100 MG PO TABS: 100.0000 mg | ORAL_TABLET | Freq: Every day | ORAL | Status: DC

## 2013-03-08 MED ORDER — HEPARIN BOLUS VIA INFUSION
950.0000 [IU] | Freq: Once | INTRAVENOUS | Status: AC
  Administered 2013-03-08: 950 [IU] via INTRAVENOUS
  Filled 2013-03-08: qty 950

## 2013-03-08 NOTE — H&P (View-Only) (Signed)
    Subjective:  Feeling better. No chest pain or shortness of breath.  Objective:  Vital Signs in the last 24 hours: Temp:  [97.8 F (36.6 C)-98.9 F (37.2 C)] 97.8 F (36.6 C) (12/08 0558) Pulse Rate:  [77-107] 102 (12/08 1128) Resp:  [18-20] 18 (12/08 0558) BP: (124-156)/(85-91) 124/91 mmHg (12/08 1128) SpO2:  [93 %-94 %] 94 % (12/08 0558) Weight:  [145 lb 1 oz (65.8 kg)] 145 lb 1 oz (65.8 kg) (12/07 1326)  Intake/Output from previous day: 12/07 0701 - 12/08 0700 In: 613 [P.O.:360; I.V.:253] Out: 500 [Urine:500]  Physical Exam: Pt is alert and oriented, in NAD HEENT: normal Neck: JVP - normal Lungs: CTA bilaterally CV: RRR without murmur or gallop Abd: soft, NT, Positive BS, no hepatomegaly Ext: no C/C/E, distal pulses intact and equal Skin: warm/dry no rash   Lab Results:  Recent Labs  03/07/13 0805 03/08/13 0130  WBC 4.2 6.3  HGB 15.5 14.2  PLT 149* 127*    Recent Labs  03/07/13 0805 03/08/13 0130  NA 139 137  K 3.9 3.9  CL 102 103  CO2 27 28  GLUCOSE 141* 103*  BUN 12 15  CREATININE 0.83 0.88    Recent Labs  03/07/13 1954 03/08/13 0130  TROPONINI <0.30 <0.30    Cardiac Studies: 2D Echo: ------------------------------------------------------------ Left ventricle: The cavity size was normal. Wall thickness was normal. The estimated ejection fraction was 35%. Diffuse hypokinesis. Features are consistent with a pseudonormal left ventricular filling pattern, with concomitant abnormal relaxation and increased filling pressure (grade 2 diastolic dysfunction).  ------------------------------------------------------------ Aortic valve: Trileaflet. Doppler: There was no stenosis. No regurgitation.  ------------------------------------------------------------ Aorta: Aortic root: The aortic root was normal in size. Ascending aorta: The ascending aorta was normal in size.  ------------------------------------------------------------ Mitral  valve: Normal thickness leaflets . Doppler: There was no evidence for stenosis. No significant regurgitation.  ------------------------------------------------------------ Left atrium: The atrium was normal in size.  ------------------------------------------------------------ Right ventricle: The cavity size was normal. Systolic function was mildly reduced.  ------------------------------------------------------------ Pulmonic valve: Structurally normal valve. Cusp separation was normal. Doppler: Transvalvular velocity was within the normal range. No regurgitation.  ------------------------------------------------------------ Tricuspid valve: Doppler: No significant regurgitation.  ------------------------------------------------------------ Pulmonary artery: No complete TR doppler jet so unable to estimate PA systolic pressure.  ------------------------------------------------------------ Right atrium: The atrium was normal in size.  ------------------------------------------------------------ Pericardium: There was no pericardial effusion.  ------------------------------------------------------------ Systemic veins: Inferior vena cava: The vessel was normal in size; the respirophasic diameter changes were in the normal range (= 50%); findings are consistent with normal central venous pressure.  Assessment/Plan:  1. Chest pain/USAP 2. Cardiomyopathy - diff dx includes obstructive CAD and ETOH as most likely potential causes 3. Tobacco abuse 4. ETOH abuse  Echo reviewed. Will need med Rx and Etoh cessation. Reviewed risks, indications, and alternatives to cath and PCI. He understands and agrees to proceed.  [name], M.D. 03/08/2013, 12:40 PM

## 2013-03-08 NOTE — Progress Notes (Addendum)
Patient ID: Craig Perry  male  BJY:[id number]    DOB: 05-29-1960    DOA: 03/07/2013  PCP: No PCP Per Patient   Addendum  Cardiac cath done Final Conclusions:  1. Nonobstructive CAD as detailed above.  2. Mild global LV dysfunction  Recommendations: Alcohol/tobacco cessation. Med Rx for cardiomyopathy. Would not use a statin with his very heavy alcohol consumption.  Impressions: Echocardiogram   - Normal LV size with moderate global hypokinesis, EF 35%. Normal RV size with mildly decreased systolic function. No significant valvular abnormalities.  - Per Dr. Prescott Gum recommendations, check CT angiogram chest to rule out any underlying malignancy given weight loss, night sweats, HIV and hepatitis panel    RAI,RIPUDEEP M.D. [facility] 03/08/2013, 3:39 PM  Pager: [phone]     Assessment/Plan: Principal Problem:   Chest pain/ unstable angina; significant family history of CAD and is a heavy smoker - Lipid panel showed triglyceride of 155, cholesterol 66, LDL 94 - Troponins negative, 2-D echo results pending - Continue aspirin, statin, heparin drip - Cardiac cath today  Active Problems:   Tobacco abuse  - patient counseled on smoking cessation, continue nicotine patch    ETOH abuse - Currently stable, monitor closely for any alcohol withdrawal    DVT Prophylaxis:  Code Status:  Disposition: await cardiac cath, if cleared by cardiology, DC home today     Subjective:  denies any specific complaints, no chest pain or shortness of breath, awaiting cardiac cath today   Objective: Weight change:   Intake/Output Summary (Last 24 hours) at 03/08/13 1137 Last data filed at 03/08/13 0800  Gross per 24 hour  Intake    363 ml  Output   1050 ml  Net   -687 ml   Blood pressure 124/91, pulse 102, temperature 97.8 F (36.6 C), temperature source Oral, resp. rate 18, height 5' 11.5" (1.816 m), weight 65.8 kg (145 lb 1 oz), SpO2 94.00%.  Physical  Exam: General: Alert and awake, oriented x3, not in any acute distress. HEENT: anicteric sclera, PERLA, EOMI CVS: S1-S2 clear, no murmur rubs or gallops Chest: clear to auscultation bilaterally, no wheezing, rales or rhonchi Abdomen: soft nontender, nondistended, normal bowel sounds  Extremities: no cyanosis, clubbing or edema noted bilaterally Neuro: Cranial nerves II-XII intact, no focal neurological deficits  Lab Results: Basic Metabolic Panel:  Recent Labs Lab 03/07/13 0805 03/08/13 0130  NA 139 137  K 3.9 3.9  CL 102 103  CO2 27 28  GLUCOSE 141* 103*  BUN 12 15  CREATININE 0.83 0.88  CALCIUM 9.4 8.6   Liver Function Tests:  Recent Labs Lab 03/07/13 0805  AST 16  ALT 8  ALKPHOS 82  BILITOT 0.2*  PROT 7.2  ALBUMIN 3.8   No results found for this basename: LIPASE, AMYLASE,  in the last 168 hours No results found for this basename: AMMONIA,  in the last 168 hours CBC:  Recent Labs Lab 03/07/13 0805 03/08/13 0130  WBC 4.2 6.3  NEUTROABS 2.7  --   HGB 15.5 14.2  HCT 44.6 41.6  MCV 92.0 92.2  PLT 149* 127*   Cardiac Enzymes:  Recent Labs Lab 03/07/13 1411 03/07/13 1954 03/08/13 0130  TROPONINI <0.30 <0.30 <0.30   BNP: No components found with this basename: POCBNP,  CBG: No results found for this basename: GLUCAP,  in the last 168 hours   Micro Results: No results found for this or any previous visit (from the past 240 hour(s)).  Studies/Results: Dg Chest Portable  1 View  03/07/2013   CLINICAL DATA:  Chest pain.  EXAM: PORTABLE CHEST - 1 VIEW  COMPARISON:  08/15/2009  FINDINGS: The heart size and mediastinal contours are within normal limits. Both lungs are clear. The visualized skeletal structures are unremarkable.  IMPRESSION: Normal exam.   Electronically Signed   By: Geanie Cooley M.D.   On: 03/07/2013 08:43    Medications: Scheduled Meds: . aspirin EC  325 mg Oral Daily  . atorvastatin  20 mg Oral q1800  . folic acid  1 mg Oral Daily   . influenza vac split quadrivalent PF  0.5 mL Intramuscular Tomorrow-1000  . metoprolol tartrate  25 mg Oral BID  . multivitamin with minerals  1 tablet Oral Daily  . nicotine  21 mg Transdermal Daily  . pantoprazole (PROTONIX) IV  40 mg Intravenous Q24H  . sodium chloride  3 mL Intravenous Q12H  . sodium chloride  3 mL Intravenous Q12H  . thiamine  100 mg Oral Daily   Or  . thiamine  100 mg Intravenous Daily      LOS: 1 day   RAI,RIPUDEEP M.D. [facility] 03/08/2013, 11:37 AM Pager: [phone]  If 7PM-7AM, please contact night-coverage [email or web address] Password [email or web address]

## 2013-03-08 NOTE — Interval H&P Note (Signed)
History and Physical Interval Note:  03/08/2013 2:57 PM  Craig Perry  has presented today for surgery, with the diagnosis of cp  The various methods of treatment have been discussed with the patient and family. After consideration of risks, benefits and other options for treatment, the patient has consented to  Procedure(s): LEFT HEART CATHETERIZATION WITH CORONARY ANGIOGRAM (N/A) as a surgical intervention .  The patient's history has been reviewed, patient examined, no change in status, stable for surgery.  I have reviewed the patient's chart and labs.  Questions were answered to the patient's satisfaction.    Cath Lab Visit (complete for each Cath Lab visit)  Clinical Evaluation Leading to the Procedure:   ACS: yes  Non-ACS:    Anginal Classification: CCS IV  Anti-ischemic medical therapy: Minimal Therapy (1 class of medications)  Non-Invasive Test Results: No non-invasive testing performed  Prior CABG: No previous CABG       Craig Perry

## 2013-03-08 NOTE — Progress Notes (Signed)
*  PRELIMINARY RESULTS* Echocardiogram 2D Echocardiogram has been performed.  Jeryl Columbia 03/08/2013, 10:02 AM

## 2013-03-08 NOTE — Progress Notes (Signed)
TR band removed per protocol. Documented as a "level 1" due to soft hematoma about puncture site. Pt without complaints and no immediate complications post band removal. 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 Gauze and tegaderm applied. Will continue to monitor.

## 2013-03-08 NOTE — Progress Notes (Signed)
ANTICOAGULATION CONSULT NOTE - FOLLOW UP  Pharmacy Consult for Heparin Indication: chest pain/ACS  No Known Allergies  Patient Measurements: Height: 5' 11.5" (181.6 cm) Weight: 145 lb 1 oz (65.8 kg) IBW/kg (Calculated) : 76.45  Vital Signs: Temp: 97.8 F (36.6 C) (12/08 0558) Temp src: Oral (12/08 0558) BP: 136/85 mmHg (12/08 0558) Pulse Rate: 82 (12/08 0558)  Labs:  Recent Labs  03/07/13 0805 03/07/13 1411 03/07/13 1840 03/07/13 1954 03/08/13 0130 03/08/13 0756  HGB 15.5  --   --   --  14.2  --   HCT 44.6  --   --   --  41.6  --   PLT 149*  --   --   --  127*  --   LABPROT  --   --   --   --  13.2 12.4  INR  --   --   --   --  1.02 0.94  HEPARINUNFRC  --   --  <0.10*  --  0.37 0.27*  CREATININE 0.83  --   --   --  0.88  --   TROPONINI <0.30 <0.30  --  <0.30 <0.30  --    Estimated Creatinine Clearance: 89.3 ml/min (by C-G formula based on Cr of 0.88).  Medical History: Past Medical History  Diagnosis Date  . Stroke   . Myocardial infarction    Medications:  Infusions:  . sodium chloride 1 mL/kg/hr (03/08/13 0400)  . heparin 1,000 Units/hr (03/08/13 1308)    Assessment: 52yo male with h/o stroke and MI presents to ED c/o chest pain.  He is on heparin for ACS awaiting cath this afternoon. Currently running heparin at 1000 units/hr. Repeat HL is subtherapeutic at 0.27. H/H wnl. Platelets have trended down to 127. Per RN, infusion is running well and no signs of bleeding noted.   Goal of Therapy:  Heparin level 0.3-0.7 units/ml Monitor platelets by anticoagulation protocol: Yes   Plan:  1) Heparin bolus of 950 units x 1 dose  2) Increase heparin infusion to 1100 units/hr  3) Monitor CBC and s/s of bleeding 4) F/u after cath   Vinnie Level, PharmD.  Clinical Pharmacist Pager [phone]

## 2013-03-08 NOTE — CV Procedure (Addendum)
    Cardiac Catheterization Procedure Note  Name: Craig Perry MRN: [id number] DOB: Dec 20, 1960  Procedure: Left Heart Cath, Selective Coronary Angiography, LV angiography  Indication: cardiomyopathy, chest pain concerning for unstable angina   Procedural Details: The right wrist was prepped, draped, and anesthetized with 1% lidocaine. Using the modified Seldinger technique, a 5 French sheath was introduced into the right radial artery. 3 mg of verapamil was administered through the sheath, weight-based unfractionated heparin was administered intravenously. Standard Judkins catheters were used for selective coronary angiography and left ventriculography. Catheter exchanges were performed over an exchange length guidewire. There were no immediate procedural complications. A TR band was used for radial hemostasis at the completion of the procedure.  The patient was transferred to the post catheterization recovery area for further monitoring.  Procedural Findings: Hemodynamics: AO 121/82 LV 123/13  Coronary angiography: Coronary dominance: right  Left mainstem: Mild calcification, widely patent.  Left anterior descending (LAD): Normal caliber vessel. There is no proximal vessel stenosis. The mid-vessel tapers with about 40-50% stenosis at the bifurcation. No significant distal vessel disease.  Left circumflex (LCx): Normal caliber vessel. No obstructive disease. 2 small OM branches without significant disease.  Right coronary artery (RCA): 50% proximal vessel stenosis. Dominant vessel. The PDA and PLA branches are patent.  Left ventriculography: Left ventricular systolic function is mildly depressed, LVEF is estimated at 45%, there is no significant mitral regurgitation. There is mild global hypokinesis.  Final Conclusions:   1. Nonobstructive CAD as detailed above.  2. Mild global LV dysfunction  Recommendations: Alcohol/tobacco cessation. Med Rx for cardiomyopathy. Would not use  a statin with his very heavy alcohol consumption.  [name] 03/08/2013, 3:22 PM

## 2013-03-08 NOTE — Progress Notes (Signed)
ANTICOAGULATION CONSULT NOTE - Follow Up Consult  Pharmacy Consult for heparin Indication: USAP  Labs:  Recent Labs  03/07/13 0805 03/07/13 1411 03/07/13 1840 03/07/13 1954 03/08/13 0130  HGB 15.5  --   --   --  14.2  HCT 44.6  --   --   --  41.6  PLT 149*  --   --   --  127*  HEPARINUNFRC  --   --  <0.10*  --  0.37  CREATININE 0.83  --   --   --  0.88  TROPONINI <0.30 <0.30  --  <0.30 <0.30    Assessment/Plan:  52yo male now therapeutic on heparin after rate increase.  Will continue gtt at current rate and confirm stable with additional level.  Vernard Gambles, PharmD, BCPS  03/08/2013,2:30 AM

## 2013-03-08 NOTE — Progress Notes (Signed)
    Subjective:  Feeling better. No chest pain or shortness of breath.  Objective:  Vital Signs in the last 24 hours: Temp:  [97.8 F (36.6 C)-98.9 F (37.2 C)] 97.8 F (36.6 C) (12/08 0558) Pulse Rate:  [77-107] 102 (12/08 1128) Resp:  [18-20] 18 (12/08 0558) BP: (124-156)/(85-91) 124/91 mmHg (12/08 1128) SpO2:  [93 %-94 %] 94 % (12/08 0558) Weight:  [145 lb 1 oz (65.8 kg)] 145 lb 1 oz (65.8 kg) (12/07 1326)  Intake/Output from previous day: 12/07 0701 - 12/08 0700 In: 613 [P.O.:360; I.V.:253] Out: 500 [Urine:500]  Physical Exam: Pt is alert and oriented, in NAD HEENT: normal Neck: JVP - normal Lungs: CTA bilaterally CV: RRR without murmur or gallop Abd: soft, NT, Positive BS, no hepatomegaly Ext: no C/C/E, distal pulses intact and equal Skin: warm/dry no rash   Lab Results:  Recent Labs  03/07/13 0805 03/08/13 0130  WBC 4.2 6.3  HGB 15.5 14.2  PLT 149* 127*    Recent Labs  03/07/13 0805 03/08/13 0130  NA 139 137  K 3.9 3.9  CL 102 103  CO2 27 28  GLUCOSE 141* 103*  BUN 12 15  CREATININE 0.83 0.88    Recent Labs  03/07/13 1954 03/08/13 0130  TROPONINI <0.30 <0.30    Cardiac Studies: 2D Echo: ------------------------------------------------------------ Left ventricle: The cavity size was normal. Wall thickness was normal. The estimated ejection fraction was 35%. Diffuse hypokinesis. Features are consistent with a pseudonormal left ventricular filling pattern, with concomitant abnormal relaxation and increased filling pressure (grade 2 diastolic dysfunction).  ------------------------------------------------------------ Aortic valve: Trileaflet. Doppler: There was no stenosis. No regurgitation.  ------------------------------------------------------------ Aorta: Aortic root: The aortic root was normal in size. Ascending aorta: The ascending aorta was normal in size.  ------------------------------------------------------------ Mitral  valve: Normal thickness leaflets . Doppler: There was no evidence for stenosis. No significant regurgitation.  ------------------------------------------------------------ Left atrium: The atrium was normal in size.  ------------------------------------------------------------ Right ventricle: The cavity size was normal. Systolic function was mildly reduced.  ------------------------------------------------------------ Pulmonic valve: Structurally normal valve. Cusp separation was normal. Doppler: Transvalvular velocity was within the normal range. No regurgitation.  ------------------------------------------------------------ Tricuspid valve: Doppler: No significant regurgitation.  ------------------------------------------------------------ Pulmonary artery: No complete TR doppler jet so unable to estimate PA systolic pressure.  ------------------------------------------------------------ Right atrium: The atrium was normal in size.  ------------------------------------------------------------ Pericardium: There was no pericardial effusion.  ------------------------------------------------------------ Systemic veins: Inferior vena cava: The vessel was normal in size; the respirophasic diameter changes were in the normal range (= 50%); findings are consistent with normal central venous pressure.  Assessment/Plan:  1. Chest pain/USAP 2. Cardiomyopathy - diff dx includes obstructive CAD and ETOH as most likely potential causes 3. Tobacco abuse 4. ETOH abuse  Echo reviewed. Will need med Rx and Etoh cessation. Reviewed risks, indications, and alternatives to cath and PCI. He understands and agrees to proceed.  Craig Perry, M.D. 03/08/2013, 12:40 PM     

## 2013-03-09 DIAGNOSIS — R079 Chest pain, unspecified: Secondary | ICD-10-CM

## 2013-03-09 MED ORDER — CARVEDILOL 3.125 MG PO TABS
3.1250 mg | ORAL_TABLET | Freq: Two times a day (BID) | ORAL | Status: DC
  Filled 2013-03-09 (×2): qty 1

## 2013-03-09 MED ORDER — NICOTINE 21 MG/24HR TD PT24: 21.0000 mg | MEDICATED_PATCH | Freq: Every day | TRANSDERMAL | Status: DC

## 2013-03-09 MED ORDER — CARVEDILOL 3.125 MG PO TABS: 3.1250 mg | ORAL_TABLET | Freq: Two times a day (BID) | ORAL | Status: DC

## 2013-03-09 MED ORDER — ASPIRIN EC 81 MG PO TBEC: 81.0000 mg | DELAYED_RELEASE_TABLET | Freq: Every day | ORAL | Status: DC

## 2013-03-09 MED ORDER — THIAMINE HCL 100 MG PO TABS: 100.0000 mg | ORAL_TABLET | Freq: Every day | ORAL | Status: DC

## 2013-03-09 MED ORDER — LOSARTAN POTASSIUM 25 MG PO TABS: 25.0000 mg | ORAL_TABLET | Freq: Every day | ORAL | Status: DC

## 2013-03-09 MED ORDER — ALBUTEROL SULFATE HFA 108 (90 BASE) MCG/ACT IN AERS
1.0000 | INHALATION_SPRAY | Freq: Once | RESPIRATORY_TRACT | Status: AC
  Administered 2013-03-09: 1 via RESPIRATORY_TRACT
  Filled 2013-03-09: qty 6.7

## 2013-03-09 MED ORDER — ALBUTEROL SULFATE HFA 108 (90 BASE) MCG/ACT IN AERS: 2.0000 | INHALATION_SPRAY | Freq: Four times a day (QID) | RESPIRATORY_TRACT | Status: DC | PRN

## 2013-03-09 NOTE — Care Management Note (Addendum)
  Page 1 of 1   03/09/2013     11:30:06 AM   CARE MANAGEMENT NOTE 03/09/2013  Patient:  Craig Perry, Craig Perry   Account Number:  [id number]  Date Initiated:  03/09/2013  Documentation initiated by:  Zev Blue  Subjective/Objective Assessment:   Admitted with unstable angina.  Patient has not seen PCP in 20+ years.     Action/Plan:   CM established initial PCP appt with [facility] Department for 04/12/2012 at 1:00pm.  Patient notified.   Anticipated DC Date:  03/09/2013   Anticipated DC Plan:  HOME/SELF CARE      DC Planning Services  CM consult  Follow-up appt scheduled      PAC Choice  NA   Choice offered to / List presented to:             Status of service:  Completed, signed off Medicare Important Message given?   (If response is "NO", the following Medicare IM given date fields will be blank) Date Medicare IM given:   Date Additional Medicare IM given:    Discharge Disposition:    Per UR Regulation:  Reviewed for med. necessity/level of care/duration of stay  If discussed at Long Length of Stay Meetings, dates discussed:    Comments:  [facility] Department of Public Health [address] [place], [place] [address] Phone: [phone] Fax: [phone] Appt 04/12/2012 at 1:00pm Precilla Purnell RN, BSN, MSHL, CCM

## 2013-03-09 NOTE — Progress Notes (Signed)
DC orders received.  Patient stable with no S/S of distress.  Medication and discharge information reviewed with patient.  Patient DC home. Grafton Warzecha Marie  

## 2013-03-09 NOTE — Progress Notes (Signed)
    Subjective:   no chest pain or shortness of breath.  Objective:  Vital Signs in the last 24 hours: Temp:  [97.8 F (36.6 C)-97.9 F (36.6 C)] 97.9 F (36.6 C) (12/09 0500) Pulse Rate:  [78-102] 98 (12/09 0500) Resp:  [18] 18 (12/09 0500) BP: (122-129)/(74-98) 129/98 mmHg (12/09 0500) SpO2:  [95 %-97 %] 95 % (12/09 0500)  Intake/Output from previous day: 12/08 0701 - 12/09 0700 In: 240 [P.O.:240] Out: 550 [Urine:550]  Physical Exam: Pt is alert and oriented, NAD HEENT: normal Lungs: CTA bilaterally CV: RRR without murmur or gallop Abd: soft, NT, Positive BS, no hepatomegaly Ext: no C/C/E, distal pulses intact and equal Skin: warm/dry no rash Right radial site is clear with mild edema but no ecchymosis or hematoma  Lab Results:  Recent Labs  03/07/13 0805 03/08/13 0130  WBC 4.2 6.3  HGB 15.5 14.2  PLT 149* 127*    Recent Labs  03/07/13 0805 03/08/13 0130  NA 139 137  K 3.9 3.9  CL 102 103  CO2 27 28  GLUCOSE 141* 103*  BUN 12 15  CREATININE 0.83 0.88    Recent Labs  03/07/13 1954 03/08/13 0130  TROPONINI <0.30 <0.30    Assessment/Plan:  1. Chest pain, suspect noncardiac. 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  2. Alcoholic cardiomyopathy. The patient is stable without symptoms of congestive heart failure. He understands that alcohol cessation is the most important part of treatment. He has been appropriately started on a beta blocker and ARB.  3. Tobacco abuse. Cessation was advised.  4. Alcohol abuse. Extensive counseling has been done. No statin prescribed because of heavy alcohol use.   [name], M.D. 03/09/2013, 10:07 AM

## 2013-03-09 NOTE — Discharge Summary (Signed)
Physician Discharge Summary  Patient ID: Craig Perry MRN: [id number] DOB/AGE: 1961-03-12 52 y.o.  Admit date: 03/07/2013 Discharge date: 03/09/2013  Primary Care Physician:  No PCP Per Patient  Discharge Diagnoses:    . Chest pain possibly noncardiac, non-obstructive coronary artery disease at cardiac cath  . Tobacco abuse . ETOH abuse . alcoholic cardiomyopathy  Consults: Cardiology, Dr. Excell Seltzer   Recommendations for Outpatient Follow-up:  Patient needs to be compliant with his medical management for cardiomyopathy, possibly recheck an echo in 6 months for improvement   Allergies:  No Known Allergies   Discharge Medications:   Medication List         albuterol 108 (90 BASE) MCG/ACT inhaler  Commonly known as:  PROVENTIL HFA;VENTOLIN HFA  Inhale 2 puffs into the lungs every 6 (six) hours as needed for wheezing or shortness of breath.     aspirin EC 81 MG tablet  Take 1 tablet (81 mg total) by mouth daily.     carvedilol 3.125 MG tablet  Commonly known as:  COREG  Take 1 tablet (3.125 mg total) by mouth 2 (two) times daily with a meal.     folic acid 1 MG tablet  Commonly known as:  FOLVITE  Take 1 tablet (1 mg total) by mouth daily.     losartan 25 MG tablet  Commonly known as:  COZAAR  Take 1 tablet (25 mg total) by mouth daily.     nicotine 21 mg/24hr patch  Commonly known as:  NICODERM CQ - dosed in mg/24 hours  Place 1 patch (21 mg total) onto the skin daily.     thiamine 100 MG tablet  Take 1 tablet (100 mg total) by mouth daily.         Brief H and P: For complete details please refer to admission H and P, but in brief Craig Perry is a 52 y.o. male with a Past Medical History of tobacco abuse (3 packs a day), alcohol abuse (approximately 36 cans of beer a day, with some liquor at times),? History of CAD approximately 7 years ago (not able to provide more history), with significant family history of CAD (see family history below) who  presented to [facility] ER with complaints of chest pain. 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 Patient describes the pain as mild. 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 Patient claims that the pain never resolved, "eased up somewhat", but then reoccurred every few minutes, with the same in severity, this lasted approximately 45 minutes, as a result he was brought to the emergency room for further evaluation and treatment. Patient claims that the pain made his left arm feels heavy/numb. He had associated shortness of breath, and mild diaphoresis. He had no nausea or vomiting.  When he came to the emergency room, patient was still having chest discomfort, he was given nitroglycerin which did not completely alleviate his pain but in fact made him hypotensive. He was given IV fluids, then given IV morphine which completely resolved his pain.  Given his risk factors and concern for possible unstable angina, patient was transferred to [facility] ([facility]) for further workup.  Hospital Course:   Chest pain:  likely musculoskeletal or noncardiac. However patient had significant family history of CAD and is a heavy smoker, patient was transferred to [facility] for further workup. Patient was ruled out for acute ACS, troponins were negative. He was started on aspirin, statin and heparin drip. Cardiology was consulted and recommended cardiac catheterization. Cardiac cath showed nonobstructive coronary artery disease, mild global LV dysfunction. He was recommended alcohol/tobacco cessation and medical management for cardiomyopathy. 2-D echocardiogram was also done which showed moderate global  hypokinesis with EF 35%, mildly decreased right ventricular systolic function no significant valvular abnormalities. CT angiogram was negative for acute pulmonary embolism. Patient was placed on aspirin, low-dose Coreg, Cozaar.  Lipid panel showed triglyceride of 155, cholesterol 66, LDL 94 . Cardiology recommended against using a statin with his heavy alcohol consumption. He was also strongly recommended to followup with PCP, followup appointment was made in the [facility] health department  Tobacco abuse - patient was counseled on smoking cessation, he was placed on nicotine patch and was provided with prescriptions for nicotine patch.  ETOH abuse : He was placed CIWA protocol but remained stable and did not go into any acute withdrawal state  Day of Discharge BP 134/83  Pulse 98  Temp(Src) 97.9 F (36.6 C) (Oral)  Resp 18  Ht 5' 11.5" (1.816 m)  Wt 65.8 kg (145 lb 1 oz)  BMI 19.95 kg/m2  SpO2 95%  Physical Exam: General: Alert and awake oriented x3 not in any acute distress. CVS: S1-S2 clear no murmur rubs or gallops Chest: clear to auscultation bilaterally, no wheezing rales or rhonchi Abdomen: soft nontender, nondistended, normal bowel sounds, Extremities: no cyanosis, clubbing or edema noted bilaterally Neuro: Cranial nerves II-XII intact, no focal neurological deficits   The results of significant diagnostics from this hospitalization (including imaging, microbiology, ancillary and laboratory) are listed below for reference.    LAB RESULTS: Basic Metabolic Panel:  Recent Labs Lab 03/07/13 0805 03/08/13 0130  NA 139 137  K 3.9 3.9  CL 102 103  CO2 27 28  GLUCOSE 141* 103*  BUN 12 15  CREATININE 0.83 0.88  CALCIUM 9.4 8.6   Liver Function Tests:  Recent Labs Lab 03/07/13 0805  AST 16  ALT 8  ALKPHOS 82  BILITOT 0.2*  PROT 7.2  ALBUMIN 3.8   No results found for this basename: LIPASE, AMYLASE,  in the last 168 hours No results found for this basename:  AMMONIA,  in the last 168 hours CBC:  Recent Labs Lab 03/07/13 0805 03/08/13 0130  WBC 4.2 6.3  NEUTROABS 2.7  --   HGB 15.5 14.2  HCT 44.6 41.6  MCV 92.0 92.2  PLT 149* 127*   Cardiac Enzymes:  Recent Labs Lab 03/07/13 1954 03/08/13 0130  TROPONINI <0.30 <0.30   BNP: No components found with this basename: POCBNP,  CBG: No results found for this basename: GLUCAP,  in the last 168 hours  Significant Diagnostic Studies:  Ct Angio Chest Pe W/cm &/or Wo Cm  03/08/2013   CLINICAL DATA:  Status post cardiac catheterization today. Question of underlying malignancy/pulmonary embolus.  EXAM: CT ANGIOGRAPHY CHEST WITH CONTRAST  TECHNIQUE: Multidetector CT imaging of the chest was performed using the standard protocol during bolus administration of intravenous contrast. Multiplanar CT image reconstructions including MIPs were obtained to evaluate the vascular anatomy.  CONTRAST:  80mL OMNIPAQUE IOHEXOL 350 MG/ML SOLN  COMPARISON:  Chest x-ray 03/07/2013  FINDINGS: The pulmonary arteries and are well opacified. There is no evidence for acute  pulmonary embolus. Heart size is normal. There are scattered emphysematous changes within the lungs. At the right lung apex, there is somewhat prominent appearing pleural parenchymal change. The appearance favor scar over mass however. No focal consolidations or pleural effusions are identified. No evidence for pulmonary edema.  No mediastinal, hilar, or axillary adenopathy. The visualized portion of the thyroid gland has a normal appearance.  Images of the upper abdomen are unremarkable. There are degenerative changes in the spine. No suspicious lytic or blastic lesions are identified.  Review of the MIP images confirms the above findings.  IMPRESSION: 1. Technically adequate exam showing evidence for acute pulmonary embolus. 2. Emphysematous changes. 3. Changes at the right lung apex favoring lower parenchymal scarring over mass.   Electronically Signed   By:  Rosalie Gums M.D.   On: 03/08/2013 21:17   Dg Chest Portable 1 View  03/07/2013   CLINICAL DATA:  Chest pain.  EXAM: PORTABLE CHEST - 1 VIEW  COMPARISON:  08/15/2009  FINDINGS: The heart size and mediastinal contours are within normal limits. Both lungs are clear. The visualized skeletal structures are unremarkable.  IMPRESSION: Normal exam.   Electronically Signed   By: Geanie Cooley M.D.   On: 03/07/2013 08:43    2D ECHO: Study Conclusions  - Left ventricle: The cavity size was normal. Wall thickness was normal. The estimated ejection fraction was 35%. Diffuse hypokinesis. Features are consistent with a pseudonormal left ventricular filling pattern, with concomitant abnormal relaxation and increased filling pressure (grade 2 diastolic dysfunction). - Aortic valve: There was no stenosis. - Mitral valve: No significant regurgitation. - Right ventricle: The cavity size was normal. Systolic function was mildly reduced. - Pulmonary arteries: No complete TR doppler jet so unable to estimate PA systolic pressure. - Inferior vena cava: The vessel was normal in size; the respirophasic diameter changes were in the normal range (= 50%); findings are consistent with normal central venous pressure. Impressions:  - Normal LV size with moderate global hypokinesis, EF 35%. Normal RV size with mildly decreased systolic function. No significant valvular abnormalities.    Disposition and Follow-up: Discharge Orders   Future Orders Complete By Expires   Diet - low sodium heart healthy  As directed    Increase activity slowly  As directed        DISPOSITION: Home DIET: Heart healthy diet   DISCHARGE FOLLOW-UP Follow-up Information   Follow up with [facility] Department  On 04/12/2013. (04/12/2013 at 1:00pm )    Contact information:   [facility] Department of Public Health   [address]  [place] [address] Phone: [phone]            Fax: [phone]        Time spent on Discharge: 40 minutes  Signed:   Dionne Rossa M.D. [facility] 03/09/2013, 11:42 AM Pager: [phone]

## 2013-03-23 ENCOUNTER — Inpatient Hospital Stay: Payer: Self-pay

## 2013-04-09 ENCOUNTER — Encounter (HOSPITAL_COMMUNITY): Payer: Self-pay | Admitting: Emergency Medicine

## 2013-04-09 ENCOUNTER — Emergency Department (HOSPITAL_COMMUNITY)
Admission: EM | Admit: 2013-04-09 | Discharge: 2013-04-10 | Disposition: A | Discharge status: Home / Self Care | Payer: Self-pay | Attending: Emergency Medicine | Admitting: Emergency Medicine

## 2013-04-09 ENCOUNTER — Emergency Department (HOSPITAL_COMMUNITY): Payer: Self-pay

## 2013-04-09 DIAGNOSIS — Z79899 Other long term (current) drug therapy: Secondary | ICD-10-CM | POA: Insufficient documentation

## 2013-04-09 DIAGNOSIS — F17200 Nicotine dependence, unspecified, uncomplicated: Secondary | ICD-10-CM | POA: Insufficient documentation

## 2013-04-09 DIAGNOSIS — Z8673 Personal history of transient ischemic attack (TIA), and cerebral infarction without residual deficits: Secondary | ICD-10-CM | POA: Insufficient documentation

## 2013-04-09 DIAGNOSIS — R079 Chest pain, unspecified: Secondary | ICD-10-CM | POA: Insufficient documentation

## 2013-04-09 DIAGNOSIS — I252 Old myocardial infarction: Secondary | ICD-10-CM | POA: Insufficient documentation

## 2013-04-09 DIAGNOSIS — I10 Essential (primary) hypertension: Secondary | ICD-10-CM | POA: Insufficient documentation

## 2013-04-09 DIAGNOSIS — Z7982 Long term (current) use of aspirin: Secondary | ICD-10-CM | POA: Insufficient documentation

## 2013-04-09 DIAGNOSIS — J438 Other emphysema: Secondary | ICD-10-CM | POA: Insufficient documentation

## 2013-04-09 DIAGNOSIS — Z95818 Presence of other cardiac implants and grafts: Secondary | ICD-10-CM | POA: Insufficient documentation

## 2013-04-09 HISTORY — DX: Essential (primary) hypertension: I10

## 2013-04-09 HISTORY — DX: Pneumothorax, unspecified: J93.9

## 2013-04-09 LAB — CBC WITH DIFFERENTIAL/PLATELET
BASOS ABS: 0 10*3/uL (ref 0.0–0.1)
BASOS PCT: 0 % (ref 0–1)
EOS ABS: 0.3 10*3/uL (ref 0.0–0.7)
Eosinophils Relative: 4 % (ref 0–5)
HCT: 44.6 % (ref 39.0–52.0)
Hemoglobin: 15.5 g/dL (ref 13.0–17.0)
Lymphocytes Relative: 29 % (ref 12–46)
Lymphs Abs: 1.9 10*3/uL (ref 0.7–4.0)
MCH: 31.8 pg (ref 26.0–34.0)
MCHC: 34.8 g/dL (ref 30.0–36.0)
MCV: 91.6 fL (ref 78.0–100.0)
Monocytes Absolute: 0.6 10*3/uL (ref 0.1–1.0)
Monocytes Relative: 10 % (ref 3–12)
NEUTROS PCT: 57 % (ref 43–77)
Neutro Abs: 3.8 10*3/uL (ref 1.7–7.7)
PLATELETS: 146 10*3/uL — AB (ref 150–400)
RBC: 4.87 MIL/uL (ref 4.22–5.81)
RDW: 12.8 % (ref 11.5–15.5)
WBC: 6.6 10*3/uL (ref 4.0–10.5)

## 2013-04-09 LAB — COMPREHENSIVE METABOLIC PANEL
ALBUMIN: 3.9 g/dL (ref 3.5–5.2)
ALT: 8 U/L (ref 0–53)
AST: 16 U/L (ref 0–37)
Alkaline Phosphatase: 94 U/L (ref 39–117)
BUN: 15 mg/dL (ref 6–23)
CO2: 26 mEq/L (ref 19–32)
Calcium: 9.4 mg/dL (ref 8.4–10.5)
Chloride: 99 mEq/L (ref 96–112)
Creatinine, Ser: 0.91 mg/dL (ref 0.50–1.35)
GFR calc Af Amer: >90 mL/min (ref >90)
GFR calc non Af Amer: >90 mL/min (ref >90)
Glucose, Bld: 103 mg/dL — ABNORMAL HIGH (ref 70–99)
POTASSIUM: 4.1 meq/L (ref 3.7–5.3)
SODIUM: 141 meq/L (ref 137–147)
TOTAL PROTEIN: 7.5 g/dL (ref 6.0–8.3)
Total Bilirubin: <0.2 mg/dL — ABNORMAL LOW (ref 0.3–1.2)

## 2013-04-09 LAB — TROPONIN I

## 2013-04-09 LAB — GLUCOSE, CAPILLARY: Glucose-Capillary: 108 mg/dL — ABNORMAL HIGH (ref 70–99)

## 2013-04-09 NOTE — ED Notes (Addendum)
Central chest pain began this morning at 0830.  Radiates to L neck associated w/SOB, dizziness. Nothing makes it better, but some activity at work made it worse.  Denies N/V.  Reports increased stress w/start of new job, and relational stress at home w/significant other's son.  Drank 8 beers today.  Normally takes in 12 pack daily.  Patient also reports that he has had a 45 lb. Weight loss over a four month period w/increased appetite and thirst, but has had slight (5 lb)  weight gain in last month. Continues to have frequent urination w/small amounts of urine.  States he goes about 20 x day.

## 2013-04-10 LAB — URINALYSIS, ROUTINE W REFLEX MICROSCOPIC
BILIRUBIN URINE: NEGATIVE
Glucose, UA: NEGATIVE mg/dL
Hgb urine dipstick: NEGATIVE
Ketones, ur: NEGATIVE mg/dL
Leukocytes, UA: NEGATIVE
NITRITE: NEGATIVE
PH: 5.5 (ref 5.0–8.0)
Protein, ur: NEGATIVE mg/dL
Specific Gravity, Urine: <1.005 — ABNORMAL LOW (ref 1.005–1.030)
Urobilinogen, UA: 0.2 mg/dL (ref 0.0–1.0)

## 2013-04-10 MED ORDER — NAPROXEN 500 MG PO TABS: 500.0000 mg | ORAL_TABLET | Freq: Two times a day (BID) | ORAL | Status: DC

## 2013-04-10 NOTE — Discharge Instructions (Signed)
Please call your doctor for a followup appointment within 24-48 hours. When you talk to your doctor please let them know that you were seen in the emergency department and have them acquire all of your records so that they can discuss the findings with you and formulate a treatment plan to fully care for your new and ongoing problems. ° °

## 2013-04-10 NOTE — ED Notes (Signed)
Patient with no complaints at this time. Respirations even and unlabored. Skin warm/dry. Discharge instructions reviewed with patient at this time. Patient given opportunity to voice concerns/ask questions. IV removed per policy and band-aid applied to site. Patient discharged at this time and left Emergency Department with steady gait.  

## 2013-04-10 NOTE — ED Provider Notes (Signed)
CSN: [id number]     Arrival date & time 04/09/13  2249 History   First MD Initiated Contact with Patient 04/09/13 2256     Chief Complaint  Patient presents with  . Chest Pain   (Consider location/radiation/quality/duration/timing/severity/associated sxs/prior Treatment) HPI Comments: 53 year old male, history of hypertension, history of heavy tobacco use and heavy alcohol use who presents with a complaint of chest pain which she states started at 9:00 this morning, has been persistent throughout the day for longer than 9 straight hours, is left parasternal and sharp in nature. 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 The patient was discharged with prescriptions for medications which he has not yet filled because of financial constraints.  He says that the pain has gradually eased off at this time he is pain-free. He has no shortness of breath, no nausea vomiting or diaphoresis.  Patient is a 53 y.o. male presenting with chest pain. The history is provided by the patient.  Chest Pain   Past Medical History  Diagnosis Date  . Stroke   . Myocardial infarction   . Hypertension   . Pneumothorax    Past Surgical History  Procedure Laterality Date  . Splenectomy    . Abdominal surgery    . Colon surgery     History reviewed. No pertinent family history. History  Substance Use Topics  . Smoking status: Current Every Day Smoker -- 2.00 packs/day for 45 years    Types: Cigarettes  . Smokeless tobacco: Not on file  . Alcohol Use: 7.2 oz/week    12 Cans of beer per week     Comment: daily    Review of Systems  Cardiovascular: Positive for chest pain.  All other systems reviewed and are negative.    Allergies  Review of patient's allergies indicates no known allergies.  Home Medications   Current Outpatient Rx   Name  Route  Sig  Dispense  Refill  . albuterol (PROVENTIL HFA;VENTOLIN HFA) 108 (90 BASE) MCG/ACT inhaler   Inhalation   Inhale 2 puffs into the lungs every 6 (six) hours as needed for wheezing or shortness of breath.   1 Inhaler   4   . aspirin EC 81 MG tablet   Oral   Take 1 tablet (81 mg total) by mouth daily.   30 tablet   4   . carvedilol (COREG) 3.125 MG tablet   Oral   Take 1 tablet (3.125 mg total) by mouth 2 (two) times daily with a meal.   60 tablet   3   . folic acid (FOLVITE) 1 MG tablet   Oral   Take 1 tablet (1 mg total) by mouth daily.   30 tablet   3   . losartan (COZAAR) 25 MG tablet   Oral   Take 1 tablet (25 mg total) by mouth daily.   30 tablet   4   . naproxen (NAPROSYN) 500 MG tablet   Oral   Take 1 tablet (500 mg total) by mouth 2 (two) times daily with a meal.   30 tablet   0   . nicotine (NICODERM CQ - DOSED IN MG/24 HOURS) 21 mg/24hr patch   Transdermal   Place 1 patch (21 mg total) onto the skin daily.   28 patch   0   . thiamine 100 MG tablet  Oral   Take 1 tablet (100 mg total) by mouth daily.   30 tablet   3    BP 118/81  Pulse 92  Resp 24  SpO2 93% Physical Exam  Nursing note and vitals reviewed. Constitutional: He appears well-developed and well-nourished. No distress.  HENT:  Head: Normocephalic and atraumatic.  Mouth/Throat: Oropharynx is clear and moist. No oropharyngeal exudate.  Eyes: Conjunctivae and EOM are normal. Pupils are equal, round, and reactive to light. Right eye exhibits no discharge. Left eye exhibits no discharge. No scleral icterus.  Neck: Normal range of motion. Neck supple. No JVD present. No thyromegaly present.  Cardiovascular: Normal rate, regular rhythm, normal heart sounds and intact distal pulses.  Exam reveals no gallop and no friction rub.   No murmur heard. Pulmonary/Chest: Effort normal and breath sounds normal. No respiratory distress. He has no wheezes. He has no rales. He exhibits  tenderness ( Reproducible chest tenderness to the left parasternal area).  Abdominal: Soft. Bowel sounds are normal. He exhibits no distension and no mass. There is no tenderness.  Musculoskeletal: Normal range of motion. He exhibits no edema and no tenderness.  Lymphadenopathy:    He has no cervical adenopathy.  Neurological: He is alert. Coordination normal.  Skin: Skin is warm and dry. No rash noted. No erythema.  Psychiatric: He has a normal mood and affect. His behavior is normal.    ED Course  Procedures (including critical care time) Labs Review Labs Reviewed  CBC WITH DIFFERENTIAL - Abnormal; Notable for the following:    Platelets 146 (*)    All other components within normal limits  COMPREHENSIVE METABOLIC PANEL - Abnormal; Notable for the following:    Glucose, Bld 103 (*)    Total Bilirubin <0.2 (*)    All other components within normal limits  URINALYSIS, ROUTINE W REFLEX MICROSCOPIC - Abnormal; Notable for the following:    Specific Gravity, Urine <1.005 (*)    All other components within normal limits  GLUCOSE, CAPILLARY - Abnormal; Notable for the following:    Glucose-Capillary 108 (*)    All other components within normal limits  TROPONIN I   Imaging Review Dg Chest 2 View  04/09/2013   CLINICAL DATA:  Left-sided chest pain.  EXAM: CHEST  2 VIEW  COMPARISON:  CT chest 03/08/2013 and single view of the chest 03/07/2013.  FINDINGS: The chest is hyperexpanded but the lungs are clear. Heart size is normal. No pneumothorax or pleural fluid.  IMPRESSION: Emphysema without acute disease.   Electronically Signed   By: Drusilla Kanner M.D.   On: 04/09/2013 23:57    EKG Interpretation    Date/Time:  Friday April 09 2013 22:49:40 EST Ventricular Rate:  98 PR Interval:  142 QRS Duration: 82 QT Interval:  358 QTC Calculation: 457 R Axis:   77 Text Interpretation:  Normal sinus rhythm Normal ECG When compared with ECG of 08-Mar-2013 05:13, No significant change was  found Confirmed by Isaac Lacson  MD, Aylla Huffine ([id number]) on 04/09/2013 11:34:01 PM            MDM   1. Chest pain    The patient has an EKG which is nonischemic, a normal troponin after greater than 9 hours of ongoing chest pain, he is now chest pain-free. He is known to have emphysema, this is demonstrated on his chest x-ray today. Review of the medical records shows that during his hospitalization one month ago he did not have a myocardial infarction, he had a  CT angiogram at that time showing no signs of pulmonary embolism or lung cancer. The patient appears stable and should be okay to discharge. The patient will follow up closely, he states he has appointment at the health Department on Monday for evaluation, [facility]  EKG unremarkable, troponin normal, patient reexamined, no pain, and discuss with patient very heavy alcohol use, tobacco use and noncompliance with medications, he is amenable to followup, changing his medication use behavior and will have insurance at the beginning of the week. His vital signs remained unremarkable at this time, despite mild tachypnea documented by nursing staff he was not cachectic on my exam, he does not in any respiratory distress and had no wheezing. The patient appears stable at discharge. He does have chronic emphysema due to his heavy tobacco use which is likely the cause of his mild hypoxia.  Vida RollerBrian D Joleena Weisenburger, MD 04/10/13 [id number]

## 2013-09-27 ENCOUNTER — Encounter (HOSPITAL_COMMUNITY): Payer: Self-pay | Admitting: Emergency Medicine

## 2013-09-27 ENCOUNTER — Emergency Department (HOSPITAL_COMMUNITY)
Admission: EM | Admit: 2013-09-27 | Discharge: 2013-09-27 | Disposition: A | Discharge status: Home / Self Care | Payer: Self-pay | Attending: Emergency Medicine | Admitting: Emergency Medicine

## 2013-09-27 DIAGNOSIS — I252 Old myocardial infarction: Secondary | ICD-10-CM | POA: Insufficient documentation

## 2013-09-27 DIAGNOSIS — F1020 Alcohol dependence, uncomplicated: Secondary | ICD-10-CM | POA: Insufficient documentation

## 2013-09-27 DIAGNOSIS — I10 Essential (primary) hypertension: Secondary | ICD-10-CM | POA: Insufficient documentation

## 2013-09-27 DIAGNOSIS — Z8673 Personal history of transient ischemic attack (TIA), and cerebral infarction without residual deficits: Secondary | ICD-10-CM | POA: Insufficient documentation

## 2013-09-27 DIAGNOSIS — R071 Chest pain on breathing: Secondary | ICD-10-CM | POA: Insufficient documentation

## 2013-09-27 DIAGNOSIS — Z7982 Long term (current) use of aspirin: Secondary | ICD-10-CM | POA: Insufficient documentation

## 2013-09-27 DIAGNOSIS — G5790 Unspecified mononeuropathy of unspecified lower limb: Secondary | ICD-10-CM | POA: Insufficient documentation

## 2013-09-27 DIAGNOSIS — G5690 Unspecified mononeuropathy of unspecified upper limb: Secondary | ICD-10-CM | POA: Insufficient documentation

## 2013-09-27 DIAGNOSIS — Z8709 Personal history of other diseases of the respiratory system: Secondary | ICD-10-CM | POA: Insufficient documentation

## 2013-09-27 DIAGNOSIS — F17200 Nicotine dependence, unspecified, uncomplicated: Secondary | ICD-10-CM | POA: Insufficient documentation

## 2013-09-27 DIAGNOSIS — R0789 Other chest pain: Secondary | ICD-10-CM

## 2013-09-27 DIAGNOSIS — G629 Polyneuropathy, unspecified: Secondary | ICD-10-CM

## 2013-09-27 LAB — CBC WITH DIFFERENTIAL/PLATELET
Basophils Absolute: 0 10*3/uL (ref 0.0–0.1)
Basophils Relative: 0 % (ref 0–1)
EOS PCT: 3 % (ref 0–5)
Eosinophils Absolute: 0.2 10*3/uL (ref 0.0–0.7)
HCT: 42.5 % (ref 39.0–52.0)
Hemoglobin: 14.8 g/dL (ref 13.0–17.0)
LYMPHS ABS: 1.6 10*3/uL (ref 0.7–4.0)
Lymphocytes Relative: 28 % (ref 12–46)
MCH: 31.6 pg (ref 26.0–34.0)
MCHC: 34.8 g/dL (ref 30.0–36.0)
MCV: 90.8 fL (ref 78.0–100.0)
Monocytes Absolute: 0.6 10*3/uL (ref 0.1–1.0)
Monocytes Relative: 10 % (ref 3–12)
NEUTROS ABS: 3.3 10*3/uL (ref 1.7–7.7)
NEUTROS PCT: 59 % (ref 43–77)
PLATELETS: 155 10*3/uL (ref 150–400)
RBC: 4.68 MIL/uL (ref 4.22–5.81)
RDW: 13.3 % (ref 11.5–15.5)
WBC: 5.6 10*3/uL (ref 4.0–10.5)

## 2013-09-27 LAB — BASIC METABOLIC PANEL
BUN: 9 mg/dL (ref 6–23)
CO2: 27 meq/L (ref 19–32)
Calcium: 9.2 mg/dL (ref 8.4–10.5)
Chloride: 98 mEq/L (ref 96–112)
Creatinine, Ser: 0.81 mg/dL (ref 0.50–1.35)
GFR calc Af Amer: >90 mL/min (ref >90)
GFR calc non Af Amer: >90 mL/min (ref >90)
Glucose, Bld: 91 mg/dL (ref 70–99)
POTASSIUM: 4.1 meq/L (ref 3.7–5.3)
SODIUM: 138 meq/L (ref 137–147)

## 2013-09-27 LAB — TROPONIN I: Troponin I: <0.3 ng/mL (ref <0.30)

## 2013-09-27 MED ORDER — CARVEDILOL 3.125 MG PO TABS: 3.1250 mg | ORAL_TABLET | Freq: Two times a day (BID) | ORAL | Status: DC

## 2013-09-27 MED ORDER — METHOCARBAMOL 500 MG PO TABS: 500.0000 mg | ORAL_TABLET | Freq: Two times a day (BID) | ORAL | Status: DC

## 2013-09-27 MED ORDER — FOLIC ACID 1 MG PO TABS: 1.0000 mg | ORAL_TABLET | Freq: Every day | ORAL | Status: DC

## 2013-09-27 NOTE — Discharge Instructions (Signed)
Alcohol and Nutrition Nutrition serves two purposes. It provides energy. It also maintains body structure and function. Food supplies energy. It also provides the building blocks needed to replace worn or damaged cells. Alcoholics often eat poorly. This limits their supply of essential nutrients. This affects energy supply and structure maintenance. Alcohol also affects the body's nutrients in:  Digestion.  Storage.  Using and getting rid of waste products. IMPAIRMENT OF NUTRIENT DIGESTION AND UTILIZATION   Once ingested, food must be broken down into small components (digested). Then it is available for energy. It helps maintain body structure and function. Digestion begins in the mouth. It continues in the stomach and intestines, with help from the pancreas. The nutrients from digested food are absorbed from the intestines into the blood. Then they are carried to the liver. The liver prepares nutrients for:  Immediate use.  Storage and future use.  Alcohol inhibits the breakdown of nutrients into usable molecules.  It decreases secretion of digestive enzymes from the pancreas.  Alcohol impairs nutrient absorption by damaging the cells lining the stomach and intestines.  It also interferes with moving some nutrients into the blood.  In addition, nutritional deficiencies themselves may lead to further absorption problems.  For example, folate deficiency changes the cells that line the small intestine. This impairs how water is absorbed. It also affects absorbed nutrients. These include glucose, sodium, and additional folate.  Even if nutrients are digested and absorbed, alcohol can prevent them from being fully used. It changes their transport, storage, and excretion. Impaired utilization of nutrients by alcoholics is indicated by:  Decreased liver stores of vitamins, such as vitamin A.  Increased excretion of nutrients such as fat. ALCOHOL AND ENERGY SUPPLY   Three basic  nutritional components found in food are:  Carbohydrates.  Proteins.  Fats.  These are used as energy. Some alcoholics take in as much as 50% of their total daily calories from alcohol. They often neglect important foods.  Even when enough food is eaten, alcohol can impair the ways the body controls blood sugar (glucose) levels. It may either increase or decrease blood sugar.  In non-diabetic alcoholics, increased blood sugar (hyperglycemia) is caused by poor insulin secretion. It is usually temporary.  Decreased blood sugar (hypoglycemia) can cause serious injury even if this condition is short-lived. Low blood sugar can happen when a fasting or malnourished person drinks alcohol. When there is no food to supply energy, stored sugar is used up. The products of alcohol inhibit forming glucose from other compounds such as amino acids. As a result, alcohol causes the brain and other body tissue to lack glucose. It is needed for energy and function.  Alcohol is an energy source. But how the body processes and uses the energy from alcohol is complex. Also, when alcohol is substituted for carbohydrates, subjects tend to lose weight. This indicates that they get less energy from alcohol than from food. ALCOHOL - MAINTAINING CELL STRUCTURE AND FUNCTION  Structure Cells are made mostly of protein. So an adequate protein diet is important for maintaining cell structure. This is especially true if cells are being damaged. Research indicates that alcohol affects protein nutrition by causing impaired:  Digestion of proteins to amino acids.  Processing of amino acids by the small intestine and liver.  Synthesis of proteins from amino acids.  Protein secretion by the liver. Function Nutrients are essential for the body to function well. They provide the tools that the body needs to work well:  Proteins. °· Vitamins. °· Minerals. °Alcohol can disrupt body function. It may cause nutrient  deficiencies. And it may interfere with the way nutrients are processed. °Vitamins °· Vitamins are essential to maintain growth and normal metabolism. They regulate many of the body`s processes. Chronic heavy drinking causes deficiencies in many vitamins. This is caused by eating less. And, in some cases, vitamins may be poorly absorbed. For example, alcohol inhibits fat absorption. It impairs how the vitamins A, E, and D are normally absorbed along with dietary fats. Not enough vitamin A may cause night blindness. Not enough vitamin D may cause softening of the bones. °· Some alcoholics lack vitamins A, C, D, E, K, and the B vitamins. These are all involved in wound healing and cell maintenance. In particular, because vitamin K is necessary for blood clotting, lacking that vitamin can cause delayed clotting. The result is excess bleeding. Lacking other vitamins involved in brain function may cause severe neurological damage. °Minerals °Deficiencies of minerals such as calcium, magnesium, iron, and zinc are common in alcoholics. The alcohol itself does not seem to affect how these minerals are absorbed. Rather, they seem to occur secondary to other alcohol-related problems, such as: °· Less calcium absorbed. °· Not enough magnesium. °· More urinary excretion. °· Vomiting. °· Diarrhea. °· Not enough iron due to gastrointestinal bleeding. °· Not enough zinc or losses related to other nutrient deficiencies. °· Mineral deficiencies can cause a variety of medical consequences. These range from calcium-related bone disease to zinc-related night blindness and skin lesions. °ALCOHOL, MALNUTRITION, AND MEDICAL COMPLICATIONS  °Liver Disease  °· Alcoholic liver damage is caused primarily by alcohol itself. But poor nutrition may increase the risk of alcohol-related liver damage. For example, nutrients normally found in the liver are known to be affected by drinking alcohol. These include carotenoids, which are the major  sources of vitamin A, and vitamin E compounds. Decreases in such nutrients may play some role in alcohol-related liver damage. °Pancreatitis °· Research suggests that malnutrition may increase the risk of developing alcoholic pancreatitis. Research suggests that a diet lacking in protein may increase alcohol's damaging effect on the pancreas. °Brain °· Nutritional deficiencies may have severe effects on brain function. These may be permanent. Specifically, thiamine deficiencies are often seen in alcoholics. They can cause severe neurological problems. These include: °¨ Impaired movement. °¨ Memory loss seen in Wernicke-Korsakoff syndrome. °Pregnancy °· Alcohol has toxic effects on fetal development. It causes alcohol-related birth defects. They include fetal alcohol syndrome. Alcohol itself is toxic to the fetus. Also, the nutritional deficiency can affect how the fetus develops. That may compound the risk of developmental damage. °· Nutritional needs during pregnancy are 10% to 30% greater than normal. Food intake can increase by as much as 140% to cover the needs of both mother and fetus. An alcoholic mother`s nutritional problems may adversely affect the nutrition of the fetus. And alcohol itself can also restrict nutrition flow to the fetus. °NUTRITIONAL STATUS OF ALCOHOLICS  °Techniques for assessing nutritional status include: °· Taking body measurements to estimate fat reserves. They include: °¨ Weight. °¨ Height. °¨ Mass. °¨ Skin fold thickness. °· Performing blood analysis to provide measurements of circulating: °¨ Proteins. °¨ Vitamins. °¨ Minerals. °· These techniques tend to be imprecise. For many nutrients, there is no clear "cut-off" point that would allow an accurate definition of deficiency. So assessing the nutritional status of alcoholics is limited by these techniques. Dietary status may provide information about the risk of developing nutritional problems.   Dietary status is assessed by:  Taking  patients' dietary histories.  Evaluating the amount and types of food they are eating.  It is difficult to determine what exact amount of alcohol begins to have damaging effects on nutrition. In general, moderate drinkers have 2 drinks or less per day. They seem to be at little risk for nutritional problems. Various medical disorders begin to appear at greater levels.  Research indicates that the majority of even the heaviest drinkers have few obvious nutritional deficiencies. Many alcoholics who are hospitalized for medical complications of their disease do have severe malnutrition. Alcoholics tend to eat poorly. Often they eat less than the amounts of food necessary to provide enough:  Carbohydrates.  Protein.  Fat.  Vitamins A and C.  B vitamins.  Minerals like calcium and iron. Of major concern is alcohol's effect on digesting food and use of nutrients. It may shift a mildly malnourished person toward severe malnutrition. Document Released: 01/10/2005 Document Revised: 06/10/2011 Document Reviewed: 06/26/2005 [facility]ExitCare Patient Information 2015 [place], [place]. This information is not intended to replace advice given to you by your health care provider. Make sure you discuss any questions you have with your health care provider.  Chest Pain (Nonspecific) It is often hard to give a diagnosis for the cause of chest pain. There is always a chance that your pain could be related to something serious, such as a heart attack or a blood clot in the lungs. You need to follow up with your doctor. HOME CARE  If antibiotic medicine was given, take it as directed by your doctor. Finish the medicine even if you start to feel better.  For the next few days, avoid activities that bring on chest pain. Continue physical activities as told by your doctor.  Do not use any tobacco products. This includes cigarettes, chewing tobacco, and e-cigarettes.  Avoid drinking alcohol.  Only take medicine as  told by your doctor.  Follow your doctor's suggestions for more testing if your chest pain does not go away.  Keep all doctor visits you made. GET HELP IF:  Your chest pain does not go away, even after treatment.  You have a rash with blisters on your chest.  You have a fever. GET HELP RIGHT AWAY IF:   You have more pain or pain that spreads to your arm, neck, jaw, back, or belly (abdomen).  You have shortness of breath.  You cough more than usual or cough up blood.  You have very bad back or belly pain.  You feel sick to your stomach (nauseous) or throw up (vomit).  You have very bad weakness.  You pass out (faint).  You have chills. This is an emergency. Do not wait to see if the problems will go away. Call your local emergency services (911 in [place] Do not drive yourself to the hospital. MAKE SURE YOU:   Understand these instructions.  Will watch your condition.  Will get help right away if you are not doing well or get worse. Document Released: 09/04/2007 Document Revised: 03/23/2013 Document Reviewed: 09/04/2007 [facility]Exit[facility] Patient Information 2015 [place], [place]. This information is not intended to replace advice given to you by your health care provider. Make sure you discuss any questions you have with your health care provider.  Chest Wall Pain Chest wall pain is pain in or around the bones and muscles of your chest. It may take up to 6 weeks to get better. It may take longer if you must stay physically  active in your work and activities.  CAUSES  Chest wall pain may happen on its own. However, it may be caused by:  A viral illness like the flu.  Injury.  Coughing.  Exercise.  Arthritis.  Fibromyalgia.  Shingles. HOME CARE INSTRUCTIONS   Avoid overtiring physical activity. Try not to strain or perform activities that cause pain. This includes any activities using your chest or your abdominal and side muscles, especially if heavy weights are  used.  Put ice on the sore area.  Put ice in a plastic bag.  Place a towel between your skin and the bag.  Leave the ice on for 15-20 minutes per hour while awake for the first 2 days.  Only take over-the-counter or prescription medicines for pain, discomfort, or fever as directed by your caregiver. SEEK IMMEDIATE MEDICAL CARE IF:   Your pain increases, or you are very uncomfortable.  You have a fever.  Your chest pain becomes worse.  You have new, unexplained symptoms.  You have nausea or vomiting.  You feel sweaty or lightheaded.  You have a cough with phlegm (sputum), or you cough up blood. MAKE SURE YOU:   Understand these instructions.  Will watch your condition.  Will get help right away if you are not doing well or get worse. Document Released: 03/18/2005 Document Revised: 06/10/2011 Document Reviewed: 11/12/2010 [facility] Patient Information 2015 [place], [place]. This information is not intended to replace advice given to you by your health care provider. Make sure you discuss any questions you have with your health care provider.

## 2013-09-27 NOTE — ED Notes (Signed)
Patient complaining of chest pain with bilateral arm numbness starting at 0500 this morning. States he took 2 SL nitro with relief.

## 2013-09-27 NOTE — ED Provider Notes (Signed)
CSN: [id number]     Arrival date & time 09/27/13  1915 History   First MD Initiated Contact with Patient 09/27/13 1921     Chief Complaint  Patient presents with  . Chest Pain      HPI  Patient presents with complaint of numbness in his arms and legs, as well as chest pain. His numbness is been present for more than a weeks time. States has actually been feeling intermittent numbness in his arms and legs for several months. More noticeable for the last week. 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 He was doing so it is gaping today he states he had to brake large piles of dirt down flat. Did this for several hours as his work today. He felt some discomfort in his chest. He took a nitroglycerin which did not help. He called 911. Given additional nitroglycerin which did not help his pain.  Patient is weakness in either arm or leg. He states the numbness is in his wrists and ankles and into his hands and feet. He's been told this may be related to alcohol before. He drinks anywhere from a 12 pack 2 over a case of beer per day occasionally liquor. Smokes daily over 2 packs per day.  History of admission to [facility] in December of last year where he underwent cardiac catheterization. Nonocclusive coronary disease. 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  Past Medical History  Diagnosis Date  . Stroke   . Myocardial infarction   . Hypertension   . Pneumothorax    Past Surgical History  Procedure Laterality Date  . Splenectomy    . Abdominal surgery    . Colon surgery    . Liver surgery     History reviewed. No pertinent family history. History  Substance Use Topics  . Smoking status: Current Every Day Smoker -- 2.00 packs/day for 45 years    Types: Cigarettes  . Smokeless tobacco: Not on file  . Alcohol Use: 7.2 oz/week    12 Cans of beer per week     Comment: daily    Review of Systems  Constitutional: Negative for fever,  chills, diaphoresis, appetite change and fatigue.  HENT: Negative for mouth sores, sore throat and trouble swallowing.   Eyes: Negative for visual disturbance.  Respiratory: Negative for cough, chest tightness, shortness of breath and wheezing.   Cardiovascular: Positive for chest pain.  Gastrointestinal: Negative for nausea, vomiting, abdominal pain, diarrhea and abdominal distention.  Endocrine: Negative for polydipsia, polyphagia and polyuria.  Genitourinary: Negative for dysuria, frequency and hematuria.  Musculoskeletal: Negative for gait problem.  Skin: Negative for color change, pallor and rash.  Neurological: Positive for numbness. Negative for dizziness, syncope, light-headedness and headaches.  Hematological: Does not bruise/bleed easily.  Psychiatric/Behavioral: Negative for behavioral problems and confusion.      Allergies  Review of patient's allergies indicates no known allergies.  Home Medications   Prior to Admission medications   Medication Sig Start Date End Date Taking? Authorizing Provider  aspirin EC 81 MG tablet Take 1 tablet (81 mg total) by mouth daily. 03/09/13  Yes Ripudeep Jenna LuoK Rai, MD  nitroGLYCERIN (NITROSTAT) 0.4 MG SL tablet Place 0.4 mg under the tongue every 5 (five) minutes as needed for chest pain.   Yes Historical Provider, MD  carvedilol (COREG) 3.125 MG tablet Take 1 tablet (3.125 mg total) by mouth 2 (two) times daily with a meal. 09/27/13   Rolland PorterMark James, MD  folic  acid (FOLVITE) 1 MG tablet Take 1 tablet (1 mg total) by mouth daily. 09/27/13   Rolland PorterMark James, MD  methocarbamol (ROBAXIN) 500 MG tablet Take 1 tablet (500 mg total) by mouth 2 (two) times daily. 09/27/13   Rolland PorterMark James, MD   BP 113/80  Pulse 95  Temp(Src) 98.1 F (36.7 C) (Oral)  Resp 21  Ht 5\' 11"  (1.803 m)  Wt 145 lb (65.772 kg)  BMI 20.23 kg/m2  SpO2 93% Physical Exam  Constitutional: He is oriented to person, place, and time. He appears well-developed and well-nourished. No distress.   HENT:  Head: Normocephalic.  Eyes: Conjunctivae are normal. Pupils are equal, round, and reactive to light. No scleral icterus.  Neck: Normal range of motion. Neck supple. No thyromegaly present.  Cardiovascular: Normal rate and regular rhythm.  Exam reveals no gallop and no friction rub.   No murmur heard. Pulmonary/Chest: Effort normal and breath sounds normal. No respiratory distress. He has no wheezes. He has no rales.  Abdominal: Soft. Bowel sounds are normal. He exhibits no distension. There is no tenderness. There is no rebound.  Musculoskeletal: Normal range of motion.  Neurological: He is alert and oriented to person, place, and time.  Skin: Skin is warm and dry. No rash noted.  Psychiatric: He has a normal mood and affect. His behavior is normal.    ED Course  Procedures (including critical care time) Labs Review Labs Reviewed  CBC WITH DIFFERENTIAL  BASIC METABOLIC PANEL  TROPONIN I    Imaging Review No results found.   EKG Interpretation None      MDM   Final diagnoses:  Neuropathy  Alcoholism  Chest wall pain    EKG shows sinus rhythm heart rate 100. No acute or ischemic changes. No injury or ectopy noted. Isn't having the chest pain for over 5 hours upon his presentation. Troponin is normal. Discussion I do not feel this represents cardiac chest pain. He is tender to palpate in his midsternum. His symptoms are reproduced with movement of his arm similar to his activities today. Discussed with him about his numbness in his extremities. This does not show any stroke pattern. Is clearly in all 4 extremities. It is clearly a stocking and glove distribution. It is very likely related to his alcohol use. He is appropriate for outpatient treatment. Plan will be an anti-inflammatory. Rest and avoid strenuous activity. Have asked him to abstain  from alcohol. Written a prescription for folic acid and Coreg which he was to be on at discharge from the hospital in  December.    Rolland PorterMark James, MD 09/27/13 2051

## 2013-11-23 ENCOUNTER — Encounter (HOSPITAL_COMMUNITY): Payer: Self-pay | Admitting: Emergency Medicine

## 2013-11-23 ENCOUNTER — Emergency Department (HOSPITAL_COMMUNITY)
Admission: EM | Admit: 2013-11-23 | Discharge: 2013-11-23 | Disposition: A | Discharge status: Home / Self Care | Payer: Self-pay | Attending: Emergency Medicine | Admitting: Emergency Medicine

## 2013-11-23 DIAGNOSIS — I10 Essential (primary) hypertension: Secondary | ICD-10-CM | POA: Insufficient documentation

## 2013-11-23 DIAGNOSIS — I252 Old myocardial infarction: Secondary | ICD-10-CM | POA: Insufficient documentation

## 2013-11-23 DIAGNOSIS — R209 Unspecified disturbances of skin sensation: Secondary | ICD-10-CM | POA: Insufficient documentation

## 2013-11-23 DIAGNOSIS — Z8673 Personal history of transient ischemic attack (TIA), and cerebral infarction without residual deficits: Secondary | ICD-10-CM | POA: Insufficient documentation

## 2013-11-23 DIAGNOSIS — Z79899 Other long term (current) drug therapy: Secondary | ICD-10-CM | POA: Insufficient documentation

## 2013-11-23 DIAGNOSIS — Z8709 Personal history of other diseases of the respiratory system: Secondary | ICD-10-CM | POA: Insufficient documentation

## 2013-11-23 DIAGNOSIS — R200 Anesthesia of skin: Secondary | ICD-10-CM

## 2013-11-23 DIAGNOSIS — F17200 Nicotine dependence, unspecified, uncomplicated: Secondary | ICD-10-CM | POA: Insufficient documentation

## 2013-11-23 LAB — CBC WITH DIFFERENTIAL/PLATELET
Basophils Absolute: 0 10*3/uL (ref 0.0–0.1)
Basophils Relative: 0 % (ref 0–1)
EOS PCT: 3 % (ref 0–5)
Eosinophils Absolute: 0.2 10*3/uL (ref 0.0–0.7)
HEMATOCRIT: 42.9 % (ref 39.0–52.0)
Hemoglobin: 15 g/dL (ref 13.0–17.0)
LYMPHS ABS: 1.3 10*3/uL (ref 0.7–4.0)
LYMPHS PCT: 25 % (ref 12–46)
MCH: 32.3 pg (ref 26.0–34.0)
MCHC: 35 g/dL (ref 30.0–36.0)
MCV: 92.5 fL (ref 78.0–100.0)
MONO ABS: 0.8 10*3/uL (ref 0.1–1.0)
Monocytes Relative: 15 % — ABNORMAL HIGH (ref 3–12)
NEUTROS ABS: 3 10*3/uL (ref 1.7–7.7)
Neutrophils Relative %: 57 % (ref 43–77)
Platelets: 145 10*3/uL — ABNORMAL LOW (ref 150–400)
RBC: 4.64 MIL/uL (ref 4.22–5.81)
RDW: 13.3 % (ref 11.5–15.5)
WBC: 5.3 10*3/uL (ref 4.0–10.5)

## 2013-11-23 LAB — COMPREHENSIVE METABOLIC PANEL
ALK PHOS: 82 U/L (ref 39–117)
ALT: 10 U/L (ref 0–53)
AST: 22 U/L (ref 0–37)
Albumin: 4 g/dL (ref 3.5–5.2)
Anion gap: 18 — ABNORMAL HIGH (ref 5–15)
BUN: 15 mg/dL (ref 6–23)
CHLORIDE: 97 meq/L (ref 96–112)
CO2: 23 meq/L (ref 19–32)
CREATININE: 1.02 mg/dL (ref 0.50–1.35)
Calcium: 9.6 mg/dL (ref 8.4–10.5)
GFR calc Af Amer: >90 mL/min (ref >90)
GFR, EST NON AFRICAN AMERICAN: 81 mL/min — AB (ref >90)
Glucose, Bld: 85 mg/dL (ref 70–99)
POTASSIUM: 3.7 meq/L (ref 3.7–5.3)
Sodium: 138 mEq/L (ref 137–147)
Total Bilirubin: 0.3 mg/dL (ref 0.3–1.2)
Total Protein: 7.5 g/dL (ref 6.0–8.3)

## 2013-11-23 LAB — ETHANOL: ALCOHOL ETHYL (B): 122 mg/dL — AB (ref 0–11)

## 2013-11-23 LAB — MAGNESIUM: Magnesium: 2.4 mg/dL (ref 1.5–2.5)

## 2013-11-23 MED ORDER — SODIUM CHLORIDE 0.9 % IV BOLUS (SEPSIS)
500.0000 mL | Freq: Once | INTRAVENOUS | Status: AC
  Administered 2013-11-23: 500 mL via INTRAVENOUS

## 2013-11-23 NOTE — ED Notes (Signed)
Pt with numbness to right arm then to left arm, then to right leg, numbness has resolved except to right foot, denies pain

## 2013-11-23 NOTE — ED Provider Notes (Signed)
CSN: [id number]     Arrival date & time 11/23/13  1948 History   First MD Initiated Contact with Patient 11/23/13 2014     Chief Complaint  Patient presents with  . Numbness     (Consider location/radiation/quality/duration/timing/severity/associated sxs/prior Treatment) The history is provided by the patient.   patient states he has had episodes of numbness. He states it has been in left hand right hand and right foot. He states it come and go. He states he had one earlier in the day today but also had cramping in his right upper arm. He states he is right-handed had no difficulty using it. He states it is felt as if there was a numb area on his forearm. No headache. No confusion. He states he got dehydrated yesterday. He does drink heavily and states he's cut back to around 12 beers a day from 2 cases a day. No chest pain. No trouble breathing. He states he is back to normal now. He states he wanted to make sure he was not having a stroke. He also smokes about a pack a day  Past Medical History  Diagnosis Date  . Stroke   . Myocardial infarction   . Hypertension   . Pneumothorax    Past Surgical History  Procedure Laterality Date  . Splenectomy    . Abdominal surgery    . Liver surgery    . Colon surgery     History reviewed. No pertinent family history. History  Substance Use Topics  . Smoking status: Current Every Day Smoker -- 2.00 packs/day for 45 years    Types: Cigarettes  . Smokeless tobacco: Not on file  . Alcohol Use: 7.2 oz/week    12 Cans of beer per week     Comment: daily    Review of Systems  Constitutional: Negative for activity change and appetite change.  Eyes: Negative for pain.  Respiratory: Negative for chest tightness and shortness of breath.   Cardiovascular: Negative for chest pain and leg swelling.  Gastrointestinal: Negative for nausea, vomiting, abdominal pain and diarrhea.  Genitourinary: Negative for flank pain.  Musculoskeletal: Negative for  back pain and neck stiffness.  Skin: Negative for rash.  Neurological: Positive for numbness. Negative for weakness and headaches.  Psychiatric/Behavioral: Negative for behavioral problems.      Allergies  Review of patient's allergies indicates no known allergies.  Home Medications   Prior to Admission medications   Medication Sig Start Date End Date Taking? Authorizing Provider  carvedilol (COREG) 3.125 MG tablet Take 1 tablet (3.125 mg total) by mouth 2 (two) times daily with a meal. 09/27/13  Yes Rolland Porter, MD  nitroGLYCERIN (NITROSTAT) 0.4 MG SL tablet Place 0.4 mg under the tongue every 5 (five) minutes as needed for chest pain.   Yes Historical Provider, MD   BP 110/74  Pulse 93  Temp(Src) 97.7 F (36.5 C) (Oral)  Resp 20  Ht 5' 11.5" (1.816 m)  Wt 163 lb 1.6 oz (73.982 kg)  BMI 22.43 kg/m2  SpO2 95% Physical Exam  Constitutional: He is oriented to person, place, and time. He appears well-developed and well-nourished.  HENT:  Head: Atraumatic.  Eyes: EOM are normal. Pupils are equal, round, and reactive to light.  Neck: Normal range of motion.  Cardiovascular: Normal rate and regular rhythm.   Pulmonary/Chest: Effort normal.  Abdominal: Soft. There is no tenderness.  Neurological: He is alert and oriented to person, place, and time.  Sensation intact over all 4 hands  and feet. Good grips bilaterally. Sensation intact in radial median and ulnar distribution. No Romberg. Normal ambulation.    ED Course  Procedures (including critical care time) Labs Review Labs Reviewed  CBC WITH DIFFERENTIAL - Abnormal; Notable for the following:    Platelets 145 (*)    Monocytes Relative 15 (*)    All other components within normal limits  COMPREHENSIVE METABOLIC PANEL - Abnormal; Notable for the following:    GFR calc non Af Amer 81 (*)    Anion gap 18 (*)    All other components within normal limits  ETHANOL - Abnormal; Notable for the following:    Alcohol, Ethyl (B) 122  (*)    All other components within normal limits  MAGNESIUM    Imaging Review No results found.   EKG Interpretation None      MDM   Final diagnoses:  Numbness    Patient with wandering paresthesias. Sometimes in right hand, sometimes his left hand, sometimes in right foot. Does not fit the distribution for strokes. May be neuropathy from his heavy alcohol use. Has been seen for the same in the past. No deficits now. Patient feels better after IV fluids. Will discharge home    Juliet Rude. Rubin Payor, MD 11/23/13 2154

## 2013-11-23 NOTE — Discharge Instructions (Signed)

## 2013-12-07 ENCOUNTER — Encounter (HOSPITAL_COMMUNITY): Payer: Self-pay | Admitting: Emergency Medicine

## 2013-12-07 ENCOUNTER — Emergency Department (HOSPITAL_COMMUNITY)
Admission: EM | Admit: 2013-12-07 | Discharge: 2013-12-07 | Discharge status: Against Medical Advice | Payer: Self-pay | Attending: Emergency Medicine | Admitting: Emergency Medicine

## 2013-12-07 DIAGNOSIS — Y9289 Other specified places as the place of occurrence of the external cause: Secondary | ICD-10-CM | POA: Insufficient documentation

## 2013-12-07 DIAGNOSIS — Y9389 Activity, other specified: Secondary | ICD-10-CM | POA: Insufficient documentation

## 2013-12-07 DIAGNOSIS — S298XXA Other specified injuries of thorax, initial encounter: Secondary | ICD-10-CM | POA: Insufficient documentation

## 2013-12-07 DIAGNOSIS — I10 Essential (primary) hypertension: Secondary | ICD-10-CM | POA: Insufficient documentation

## 2013-12-07 DIAGNOSIS — W1809XA Striking against other object with subsequent fall, initial encounter: Secondary | ICD-10-CM | POA: Insufficient documentation

## 2013-12-07 DIAGNOSIS — I252 Old myocardial infarction: Secondary | ICD-10-CM | POA: Insufficient documentation

## 2013-12-07 DIAGNOSIS — F17200 Nicotine dependence, unspecified, uncomplicated: Secondary | ICD-10-CM | POA: Insufficient documentation

## 2013-12-07 NOTE — ED Notes (Signed)
Pt states he fell in the bath tub and landed on his right side against his ribs. Has pain to the right ribs and chest.

## 2013-12-07 NOTE — ED Notes (Signed)
Called for room x 2, patient did not answer. Patient not seen in waiting room.

## 2013-12-07 NOTE — ED Notes (Signed)
Called for room, no answer. Patient not seen in waiting room.

## 2014-03-10 ENCOUNTER — Encounter (HOSPITAL_COMMUNITY): Payer: Self-pay | Admitting: Cardiovascular Disease

## 2014-03-26 ENCOUNTER — Encounter (HOSPITAL_COMMUNITY): Payer: Self-pay | Admitting: Emergency Medicine

## 2014-03-26 ENCOUNTER — Emergency Department (HOSPITAL_COMMUNITY)
Admission: EM | Admit: 2014-03-26 | Discharge: 2014-03-26 | Discharge status: Against Medical Advice | Payer: Self-pay | Attending: Emergency Medicine | Admitting: Emergency Medicine

## 2014-03-26 DIAGNOSIS — I10 Essential (primary) hypertension: Secondary | ICD-10-CM | POA: Insufficient documentation

## 2014-03-26 DIAGNOSIS — S3992XA Unspecified injury of lower back, initial encounter: Secondary | ICD-10-CM | POA: Insufficient documentation

## 2014-03-26 DIAGNOSIS — Y9389 Activity, other specified: Secondary | ICD-10-CM | POA: Insufficient documentation

## 2014-03-26 DIAGNOSIS — Y998 Other external cause status: Secondary | ICD-10-CM | POA: Insufficient documentation

## 2014-03-26 DIAGNOSIS — I252 Old myocardial infarction: Secondary | ICD-10-CM | POA: Insufficient documentation

## 2014-03-26 DIAGNOSIS — Z720 Tobacco use: Secondary | ICD-10-CM | POA: Insufficient documentation

## 2014-03-26 DIAGNOSIS — Y9289 Other specified places as the place of occurrence of the external cause: Secondary | ICD-10-CM | POA: Insufficient documentation
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
## 2014-03-26 NOTE — ED Notes (Signed)
Called patient from waiting room x 1 w/out reply

## 2014-03-26 NOTE — ED Notes (Signed)
Patient called from waiting room x 2. No response

## 2014-07-22 ENCOUNTER — Encounter (HOSPITAL_COMMUNITY): Payer: Self-pay

## 2014-07-22 ENCOUNTER — Emergency Department (HOSPITAL_COMMUNITY): Payer: Self-pay

## 2014-07-22 ENCOUNTER — Emergency Department (HOSPITAL_COMMUNITY)
Admission: EM | Admit: 2014-07-22 | Discharge: 2014-07-23 | Disposition: A | Discharge status: Home / Self Care | Payer: Self-pay | Attending: Emergency Medicine | Admitting: Emergency Medicine

## 2014-07-22 DIAGNOSIS — Z720 Tobacco use: Secondary | ICD-10-CM | POA: Insufficient documentation

## 2014-07-22 DIAGNOSIS — I10 Essential (primary) hypertension: Secondary | ICD-10-CM | POA: Insufficient documentation

## 2014-07-22 DIAGNOSIS — Z79899 Other long term (current) drug therapy: Secondary | ICD-10-CM | POA: Insufficient documentation

## 2014-07-22 DIAGNOSIS — J9801 Acute bronchospasm: Secondary | ICD-10-CM | POA: Insufficient documentation

## 2014-07-22 DIAGNOSIS — I252 Old myocardial infarction: Secondary | ICD-10-CM | POA: Insufficient documentation

## 2014-07-22 DIAGNOSIS — Z8673 Personal history of transient ischemic attack (TIA), and cerebral infarction without residual deficits: Secondary | ICD-10-CM | POA: Insufficient documentation

## 2014-07-22 DIAGNOSIS — R0789 Other chest pain: Secondary | ICD-10-CM | POA: Insufficient documentation

## 2014-07-22 HISTORY — DX: Alcohol abuse, uncomplicated: F10.10

## 2014-07-22 LAB — BASIC METABOLIC PANEL
ANION GAP: 12 (ref 5–15)
BUN: 9 mg/dL (ref 6–23)
CHLORIDE: 106 mmol/L (ref 96–112)
CO2: 22 mmol/L (ref 19–32)
Calcium: 8.8 mg/dL (ref 8.4–10.5)
Creatinine, Ser: 0.79 mg/dL (ref 0.50–1.35)
GFR calc non Af Amer: >90 mL/min (ref >90)
Glucose, Bld: 97 mg/dL (ref 70–99)
Potassium: 3.7 mmol/L (ref 3.5–5.1)
Sodium: 140 mmol/L (ref 135–145)

## 2014-07-22 LAB — CBC
HCT: 45 % (ref 39.0–52.0)
HEMOGLOBIN: 15.3 g/dL (ref 13.0–17.0)
MCH: 31.5 pg (ref 26.0–34.0)
MCHC: 34 g/dL (ref 30.0–36.0)
MCV: 92.6 fL (ref 78.0–100.0)
PLATELETS: 133 10*3/uL — AB (ref 150–400)
RBC: 4.86 MIL/uL (ref 4.22–5.81)
RDW: 13.6 % (ref 11.5–15.5)
WBC: 5.6 10*3/uL (ref 4.0–10.5)

## 2014-07-22 LAB — BRAIN NATRIURETIC PEPTIDE: B NATRIURETIC PEPTIDE 5: 16 pg/mL (ref 0.0–100.0)

## 2014-07-22 LAB — TROPONIN I: Troponin I: <0.03 ng/mL (ref <0.031)

## 2014-07-22 MED ORDER — METHYLPREDNISOLONE SODIUM SUCC 125 MG IJ SOLR
125.0000 mg | Freq: Once | INTRAMUSCULAR | Status: AC
  Administered 2014-07-22: 125 mg via INTRAVENOUS
  Filled 2014-07-22: qty 2

## 2014-07-22 MED ORDER — NITROGLYCERIN 0.4 MG SL SUBL
0.4000 mg | SUBLINGUAL_TABLET | SUBLINGUAL | Status: DC | PRN
  Administered 2014-07-22 (×2): 0.4 mg via SUBLINGUAL
  Filled 2014-07-22: qty 1

## 2014-07-22 MED ORDER — ASPIRIN 325 MG PO TABS
325.0000 mg | ORAL_TABLET | ORAL | Status: AC
  Administered 2014-07-22: 325 mg via ORAL
  Filled 2014-07-22: qty 1

## 2014-07-22 MED ORDER — ALBUTEROL (5 MG/ML) CONTINUOUS INHALATION SOLN
10.0000 mg/h | INHALATION_SOLUTION | RESPIRATORY_TRACT | Status: DC
  Administered 2014-07-22: 10 mg/h via RESPIRATORY_TRACT
  Filled 2014-07-22: qty 20

## 2014-07-22 NOTE — ED Provider Notes (Signed)
CSN: [id number]     Arrival date & time 07/22/14  2245 History  This chart was scribed for Loren Racer, MD by Phillis Haggis, ED Scribe. This patient was seen in room APA09/APA09 and patient care was started at 11:20 PM.    Chief Complaint  Patient presents with  . Chest Pain   Patient is a 54 y.o. male presenting with chest pain. The history is provided by the patient. No language interpreter was used.  Chest Pain Associated symptoms: cough and shortness of breath   Associated symptoms: no abdominal pain, no back pain, no dizziness, no fever, no headache, no nausea, no numbness, no palpitations, not vomiting and no weakness    HPI Comments: Craig Perry is a 54 y.o. male with a history of stroke and MI who presents to the Emergency Department complaining of chest tightness and SOB with wheezing worsening 1 hour ago. He states that he had difficulty breathing with a cough that was non-productive. He states that he felt chest tightness. He reports that he smokes about 4 packs a day for about 43 years. He reports that he has had a history of stroke and heart attack, but that these symptoms are different.  He reports tightness with deep breaths, and chest pain upon palpation of the left chest. He reports having pneumonia for the past 3 months but has not taken anything for the symptoms. He denies having a history of COPD and using oxygen at home. Pt with cardiac cath in 2014 with non-obstructive CAD  Past Medical History  Diagnosis Date  . Stroke   . Myocardial infarction   . Hypertension   . Pneumothorax   . Alcohol abuse    Past Surgical History  Procedure Laterality Date  . Splenectomy    . Abdominal surgery    . Liver surgery    . Colon surgery    . Left heart catheterization with coronary angiogram N/A 03/08/2013    Procedure: LEFT HEART CATHETERIZATION WITH CORONARY ANGIOGRAM;  Surgeon: Micheline Chapman, MD;  Location: [facility] CATH LAB;  Service: Cardiovascular;  Laterality: N/A;    History reviewed. No pertinent family history. History  Substance Use Topics  . Smoking status: Current Every Day Smoker -- 4.00 packs/day for 45 years    Types: Cigarettes  . Smokeless tobacco: Not on file  . Alcohol Use: 7.2 oz/week    12 Cans of beer per week     Comment: daily 48 beers    Review of Systems  Constitutional: Negative for fever and chills.  HENT: Negative for congestion and sore throat.   Respiratory: Positive for cough, chest tightness, shortness of breath and wheezing.   Cardiovascular: Positive for chest pain. Negative for palpitations and leg swelling.  Gastrointestinal: Negative for nausea, vomiting and abdominal pain.  Musculoskeletal: Negative for back pain, neck pain and neck stiffness.  Skin: Negative for rash and wound.  Neurological: Negative for dizziness, weakness, light-headedness, numbness and headaches.  All other systems reviewed and are negative.     Allergies  Review of patient's allergies indicates no known allergies.  Home Medications   Prior to Admission medications   Medication Sig Start Date End Date Taking? Authorizing Provider  carvedilol (COREG) 3.125 MG tablet Take 1 tablet (3.125 mg total) by mouth 2 (two) times daily with a meal. 09/27/13   Rolland Porter, MD  nitroGLYCERIN (NITROSTAT) 0.4 MG SL tablet Place 0.4 mg under the tongue every 5 (five) minutes as needed for chest pain.  Historical Provider, MD  predniSONE (DELTASONE) 20 MG tablet 3 tabs po day one, then 2 po daily x 4 days 07/23/14   Loren Racer, MD   BP 97/66 mmHg  Pulse 97  Temp(Src) 97.8 F (36.6 C) (Oral)  Resp 20  Ht  (1.803 m)  Wt 150 lb (68.04 kg)  BMI 20.93 kg/m2  SpO2 93% Physical Exam  Constitutional: He is oriented to person, place, and time. He appears well-developed and well-nourished. No distress.  HENT:  Head: Normocephalic and atraumatic.  Mouth/Throat: Oropharynx is clear and moist. No oropharyngeal exudate.  Eyes: EOM are normal.  Pupils are equal, round, and reactive to light.  Neck: Normal range of motion. Neck supple.  Cardiovascular: Normal rate and regular rhythm.   Pulmonary/Chest: Effort normal and breath sounds normal. No respiratory distress. He has no wheezes. He has no rales. He exhibits tenderness (left chest tenderness reproduced with palpation. There is no crepitance or deformity.).  Decreased air movement throughout  Abdominal: Soft. Bowel sounds are normal. He exhibits no distension and no mass. There is no tenderness. There is no rebound and no guarding.  Musculoskeletal: Normal range of motion. He exhibits no edema or tenderness.  No calf swelling or tenderness.  Neurological: He is alert and oriented to person, place, and time.  5/5 motor in all extremities. Sensation is fully intact.  Skin: Skin is warm and dry. No rash noted. No erythema.  Psychiatric: He has a normal mood and affect. His behavior is normal.  Nursing note and vitals reviewed.   ED Course  Procedures (including critical care time) DIAGNOSTIC STUDIES: Oxygen Saturation is 93% on room air, adequate by my interpretation.    COORDINATION OF CARE: 11:25 PM-Discussed treatment plan which includes breathing treatments with pt at bedside and pt agreed to plan.   Labs Review Labs Reviewed  CBC - Abnormal; Notable for the following:    Platelets 133 (*)    All other components within normal limits  BASIC METABOLIC PANEL  BRAIN NATRIURETIC PEPTIDE  TROPONIN I    Imaging Review Dg Chest Port 1 View  07/22/2014   CLINICAL DATA:  Acute onset of generalized chest pain, shortness of breath and nausea. Initial encounter.  EXAM: PORTABLE CHEST - 1 VIEW  COMPARISON:  Chest radiograph from 04/09/2013  FINDINGS: The lungs are well-aerated and clear. There is no evidence of focal opacification, pleural effusion or pneumothorax.  The cardiomediastinal silhouette is within normal limits. No acute osseous abnormalities are seen.  IMPRESSION: No  acute cardiopulmonary process seen.   Electronically Signed   By: Roanna Raider M.D.   On: 07/22/2014 23:08     EKG Interpretation   Date/Time:  Friday July 22 2014 22:56:40 EDT Ventricular Rate:  94 PR Interval:  128 QRS Duration: 91 QT Interval:  357 QTC Calculation: 446 R Axis:   77 Text Interpretation:  Sinus rhythm Confirmed by Ranae Palms  MD, Sherlyne Crownover  ([id number]) on 07/22/2014 11:13:11 PM      MDM   Final diagnoses:  Bronchospasm  Chest wall pain    I personally performed the services described in this documentation, which was scribed in my presence. The recorded information has been reviewed and is accurate.  Patient states the shortness of breath is improved significantly after the breathing treatment. EKG and troponin are normal. Patient had a nonobstructive cath 2 years ago. He does have significant cardiac risk factors. However his chest pain is reproduced with palpation and shortness of breath is likely due  to bronchospasm/COPD. He is encouraged to call and follow-up with cardiology within the next few days. He is anxious to leave and understands the need to return immediately for worsening pain or difficulty breathing. He is encouraged to stop smoking.     Loren Raceravid Stephaney Steven, MD 07/23/14 [id number]

## 2014-07-22 NOTE — ED Notes (Signed)
Patient states chest pain started 3 days ago with shortness of breath, and nausea 2 days ago.

## 2014-07-23 MED ORDER — PREDNISONE 20 MG PO TABS: ORAL_TABLET | ORAL | Status: DC

## 2014-07-23 MED ORDER — ALBUTEROL SULFATE HFA 108 (90 BASE) MCG/ACT IN AERS: 1.0000 | INHALATION_SPRAY | RESPIRATORY_TRACT | Status: DC | PRN

## 2014-07-23 MED ORDER — ALBUTEROL SULFATE HFA 108 (90 BASE) MCG/ACT IN AERS
2.0000 | INHALATION_SPRAY | Freq: Once | RESPIRATORY_TRACT | Status: AC
  Administered 2014-07-23: 2 via RESPIRATORY_TRACT
  Filled 2014-07-23: qty 6.7

## 2014-07-23 NOTE — ED Notes (Signed)
Patient verbalizes understanding of discharge instructions, prescription medications, home care and follow up care. Patient ambulatory out of department at this time with family. 

## 2014-07-23 NOTE — Discharge Instructions (Signed)
Call and make an appointment to follow-up with your cardiologist. Take prednisone as directed and use inhaler as needed for wheezing and difficulty breathing. Return immediately for worsening pain, difficulty breathing or for any other concerns.  Bronchospasm A bronchospasm is a spasm or tightening of the airways going into the lungs. During a bronchospasm breathing becomes more difficult because the airways get smaller. When this happens there can be coughing, a whistling sound when breathing (wheezing), and difficulty breathing. Bronchospasm is often associated with asthma, but not all patients who experience a bronchospasm have asthma. CAUSES  A bronchospasm is caused by inflammation or irritation of the airways. The inflammation or irritation may be triggered by:   Allergies (such as to animals, pollen, food, or mold). Allergens that cause bronchospasm may cause wheezing immediately after exposure or many hours later.   Infection. Viral infections are believed to be the most common cause of bronchospasm.   Exercise.   Irritants (such as pollution, cigarette smoke, strong odors, aerosol sprays, and paint fumes).   Weather changes. Winds increase molds and pollens in the air. Rain refreshes the air by washing irritants out. Cold air may cause inflammation.   Stress and emotional upset.  SIGNS AND SYMPTOMS   Wheezing.   Excessive nighttime coughing.   Frequent or severe coughing with a simple cold.   Chest tightness.   Shortness of breath.  DIAGNOSIS  Bronchospasm is usually diagnosed through a history and physical exam. Tests, such as chest X-rays, are sometimes done to look for other conditions. TREATMENT   Inhaled medicines can be given to open up your airways and help you breathe. The medicines can be given using either an inhaler or a nebulizer machine.  Corticosteroid medicines may be given for severe bronchospasm, usually when it is associated with asthma. HOME  CARE INSTRUCTIONS   Always have a plan prepared for seeking medical care. Know when to call your health care provider and local emergency services (911 in the [place] Know where you can access local emergency care.  Only take medicines as directed by your health care provider.  If you were prescribed an inhaler or nebulizer machine, ask your health care provider to explain how to use it correctly. Always use a spacer with your inhaler if you were given one.  It is necessary to remain calm during an attack. Try to relax and breathe more slowly.  Control your home environment in the following ways:   Change your heating and air conditioning filter at least once a month.   Limit your use of fireplaces and wood stoves.  Do not smoke and do not allow smoking in your home.   Avoid exposure to perfumes and fragrances.   Get rid of pests (such as roaches and mice) and their droppings.   Throw away plants if you see mold on them.   Keep your house clean and dust free.   Replace carpet with wood, tile, or vinyl flooring. Carpet can trap dander and dust.   Use allergy-proof pillows, mattress covers, and box spring covers.   Wash bed sheets and blankets every week in hot water and dry them in a dryer.   Use blankets that are made of polyester or cotton.   Wash hands frequently. SEEK MEDICAL CARE IF:   You have muscle aches.   You have chest pain.   The sputum changes from clear or white to yellow, green, gray, or bloody.   The sputum you cough up gets thicker.  There are problems that may be related to the medicine you are given, such as a rash, itching, swelling, or trouble breathing.  SEEK IMMEDIATE MEDICAL CARE IF:   You have worsening wheezing and coughing even after taking your prescribed medicines.   You have increased difficulty breathing.   You develop severe chest pain. MAKE SURE YOU:   Understand these instructions.  Will watch your  condition.  Will get help right away if you are not doing well or get worse. Document Released: 03/21/2003 Document Revised: 03/23/2013 Document Reviewed: 09/07/2012 [facility] Patient Information 2015 [place], [place]. This information is not intended to replace advice given to you by your health care provider. Make sure you discuss any questions you have with your health care provider.  Chest Wall Pain Chest wall pain is pain in or around the bones and muscles of your chest. It may take up to 6 weeks to get better. It may take longer if you must stay physically active in your work and activities.  CAUSES  Chest wall pain may happen on its own. However, it may be caused by:  A viral illness like the flu.  Injury.  Coughing.  Exercise.  Arthritis.  Fibromyalgia.  Shingles. HOME CARE INSTRUCTIONS   Avoid overtiring physical activity. Try not to strain or perform activities that cause pain. This includes any activities using your chest or your abdominal and side muscles, especially if heavy weights are used.  Put ice on the sore area.  Put ice in a plastic bag.  Place a towel between your skin and the bag.  Leave the ice on for 15-20 minutes per hour while awake for the first 2 days.  Only take over-the-counter or prescription medicines for pain, discomfort, or fever as directed by your caregiver. SEEK IMMEDIATE MEDICAL CARE IF:   Your pain increases, or you are very uncomfortable.  You have a fever.  Your chest pain becomes worse.  You have new, unexplained symptoms.  You have nausea or vomiting.  You feel sweaty or lightheaded.  You have a cough with phlegm (sputum), or you cough up blood. MAKE SURE YOU:   Understand these instructions.  Will watch your condition.  Will get help right away if you are not doing well or get worse. Document Released: 03/18/2005 Document Revised: 06/10/2011 Document Reviewed: 11/12/2010 [facility] Patient Information 2015  [place], [place]. This information is not intended to replace advice given to you by your health care provider. Make sure you discuss any questions you have with your health care provider.

## 2014-12-25 ENCOUNTER — Encounter (HOSPITAL_COMMUNITY): Payer: Self-pay | Admitting: *Deleted

## 2014-12-25 ENCOUNTER — Emergency Department (HOSPITAL_COMMUNITY)
Admission: EM | Admit: 2014-12-25 | Discharge: 2014-12-25 | Discharge status: Against Medical Advice | Payer: Self-pay | Attending: Emergency Medicine | Admitting: Emergency Medicine

## 2014-12-25 DIAGNOSIS — R079 Chest pain, unspecified: Secondary | ICD-10-CM | POA: Insufficient documentation

## 2014-12-25 DIAGNOSIS — Z720 Tobacco use: Secondary | ICD-10-CM | POA: Insufficient documentation

## 2014-12-25 DIAGNOSIS — I252 Old myocardial infarction: Secondary | ICD-10-CM | POA: Insufficient documentation

## 2014-12-25 NOTE — ED Notes (Signed)
Patient moved to the waiting room to wait per Dr Patria Mane and charge.  Patient told this nurse he was going home if he was sent to the waiting room.  Removed all leads and dressed.  Family here to take him home.

## 2014-12-25 NOTE — ED Notes (Signed)
Patient going to the waiting room to wait for a room.

## 2014-12-25 NOTE — ED Notes (Signed)
EMS administered ASA  and NTG 1

## 2014-12-25 NOTE — ED Provider Notes (Signed)
MSE was initiated and I personally evaluated the patient and placed orders (if any) at  12:23 AM on December 25, 2014.  The patient appears stable so that the remainder of the MSE may be completed by another provider.   EKG Interpretation  Date/Time:  Sunday December 25 2014 00:05:44 EDT Ventricular Rate:  80 PR Interval:  119 QRS Duration: 94 QT Interval:  380 QTC Calculation: 438 R Axis:   82 Text Interpretation:  Sinus rhythm Borderline short PR interval Nonspecific T abnormalities, lateral leads No significant change was found Confirmed by CAMPOS  MD, KEVIN ([id number]) on 12/25/2014 12:17:10 AM      No active CP at this time. 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

## 2014-12-25 NOTE — ED Notes (Signed)
Patient presents stating he had CP for the past 3 days  Tonight it got worse

## 2016-04-19 ENCOUNTER — Emergency Department (HOSPITAL_COMMUNITY)
Admission: EM | Admit: 2016-04-19 | Discharge: 2016-04-19 | Disposition: A | Discharge status: Home / Self Care | Payer: Self-pay | Attending: Dermatology | Admitting: Dermatology

## 2016-04-19 ENCOUNTER — Emergency Department (HOSPITAL_COMMUNITY): Payer: Self-pay

## 2016-04-19 ENCOUNTER — Encounter (HOSPITAL_COMMUNITY): Payer: Self-pay | Admitting: Emergency Medicine

## 2016-04-19 DIAGNOSIS — I10 Essential (primary) hypertension: Secondary | ICD-10-CM | POA: Insufficient documentation

## 2016-04-19 DIAGNOSIS — Z5321 Procedure and treatment not carried out due to patient leaving prior to being seen by health care provider: Secondary | ICD-10-CM | POA: Insufficient documentation

## 2016-04-19 DIAGNOSIS — R079 Chest pain, unspecified: Secondary | ICD-10-CM | POA: Insufficient documentation

## 2016-04-19 DIAGNOSIS — R110 Nausea: Secondary | ICD-10-CM | POA: Insufficient documentation

## 2016-04-19 DIAGNOSIS — I2510 Atherosclerotic heart disease of native coronary artery without angina pectoris: Secondary | ICD-10-CM | POA: Insufficient documentation

## 2016-04-19 DIAGNOSIS — R61 Generalized hyperhidrosis: Secondary | ICD-10-CM | POA: Insufficient documentation

## 2016-04-19 DIAGNOSIS — F17210 Nicotine dependence, cigarettes, uncomplicated: Secondary | ICD-10-CM | POA: Insufficient documentation

## 2016-04-19 LAB — CBC
HCT: 46.4 % (ref 39.0–52.0)
HEMOGLOBIN: 16 g/dL (ref 13.0–17.0)
MCH: 31.6 pg (ref 26.0–34.0)
MCHC: 34.5 g/dL (ref 30.0–36.0)
MCV: 91.7 fL (ref 78.0–100.0)
PLATELETS: 157 10*3/uL (ref 150–400)
RBC: 5.06 MIL/uL (ref 4.22–5.81)
RDW: 13.1 % (ref 11.5–15.5)
WBC: 7.3 10*3/uL (ref 4.0–10.5)

## 2016-04-19 LAB — BASIC METABOLIC PANEL
ANION GAP: 8 (ref 5–15)
BUN: 15 mg/dL (ref 6–20)
CO2: 28 mmol/L (ref 22–32)
CREATININE: 1.22 mg/dL (ref 0.61–1.24)
Calcium: 9.9 mg/dL (ref 8.9–10.3)
Chloride: 102 mmol/L (ref 101–111)
GFR calc Af Amer: >60 mL/min (ref >60)
GLUCOSE: 117 mg/dL — AB (ref 65–99)
Potassium: 3.8 mmol/L (ref 3.5–5.1)
Sodium: 138 mmol/L (ref 135–145)

## 2016-04-19 LAB — TROPONIN I: TROPONIN I: 0.03 ng/mL — AB (ref <0.03)

## 2016-04-19 NOTE — ED Triage Notes (Signed)
Patient complains of chest pain x 1 day with pain that radiates down left arm. States sweating and nausea.

## 2016-04-20 ENCOUNTER — Encounter (HOSPITAL_COMMUNITY)
Admission: EM | Disposition: A | Discharge status: Home / Self Care | Payer: Self-pay | Source: Ambulatory Visit | Attending: Cardiology

## 2016-04-20 ENCOUNTER — Inpatient Hospital Stay (HOSPITAL_COMMUNITY)
Admission: EM | Admit: 2016-04-20 | Discharge: 2016-04-22 | DRG: 246 | Disposition: A | Discharge status: Home / Self Care | Payer: Self-pay | Source: Ambulatory Visit | Attending: Cardiology | Admitting: Cardiology

## 2016-04-20 DIAGNOSIS — Z955 Presence of coronary angioplasty implant and graft: Secondary | ICD-10-CM

## 2016-04-20 DIAGNOSIS — I5023 Acute on chronic systolic (congestive) heart failure: Secondary | ICD-10-CM | POA: Diagnosis present

## 2016-04-20 DIAGNOSIS — F17210 Nicotine dependence, cigarettes, uncomplicated: Secondary | ICD-10-CM | POA: Diagnosis present

## 2016-04-20 DIAGNOSIS — Z9119 Patient's noncompliance with other medical treatment and regimen: Secondary | ICD-10-CM

## 2016-04-20 DIAGNOSIS — Z9114 Patient's other noncompliance with medication regimen: Secondary | ICD-10-CM

## 2016-04-20 DIAGNOSIS — I213 ST elevation (STEMI) myocardial infarction of unspecified site: Secondary | ICD-10-CM | POA: Insufficient documentation

## 2016-04-20 DIAGNOSIS — F1010 Alcohol abuse, uncomplicated: Secondary | ICD-10-CM | POA: Diagnosis present

## 2016-04-20 DIAGNOSIS — Z9081 Acquired absence of spleen: Secondary | ICD-10-CM

## 2016-04-20 DIAGNOSIS — I252 Old myocardial infarction: Secondary | ICD-10-CM

## 2016-04-20 DIAGNOSIS — I2111 ST elevation (STEMI) myocardial infarction involving right coronary artery: Secondary | ICD-10-CM

## 2016-04-20 DIAGNOSIS — I10 Essential (primary) hypertension: Secondary | ICD-10-CM | POA: Diagnosis present

## 2016-04-20 DIAGNOSIS — Z23 Encounter for immunization: Secondary | ICD-10-CM

## 2016-04-20 DIAGNOSIS — I2119 ST elevation (STEMI) myocardial infarction involving other coronary artery of inferior wall: Secondary | ICD-10-CM

## 2016-04-20 DIAGNOSIS — Z8673 Personal history of transient ischemic attack (TIA), and cerebral infarction without residual deficits: Secondary | ICD-10-CM

## 2016-04-20 DIAGNOSIS — I255 Ischemic cardiomyopathy: Secondary | ICD-10-CM | POA: Diagnosis present

## 2016-04-20 DIAGNOSIS — I2510 Atherosclerotic heart disease of native coronary artery without angina pectoris: Secondary | ICD-10-CM | POA: Diagnosis present

## 2016-04-20 DIAGNOSIS — Z720 Tobacco use: Secondary | ICD-10-CM | POA: Diagnosis present

## 2016-04-20 DIAGNOSIS — I110 Hypertensive heart disease with heart failure: Secondary | ICD-10-CM | POA: Diagnosis present

## 2016-04-20 DIAGNOSIS — I426 Alcoholic cardiomyopathy: Secondary | ICD-10-CM | POA: Diagnosis present

## 2016-04-20 HISTORY — DX: Atherosclerotic heart disease of native coronary artery without angina pectoris: I25.10

## 2016-04-20 HISTORY — PX: CARDIAC CATHETERIZATION: SHX172

## 2016-04-20 LAB — PROTIME-INR
INR: 1.11
Prothrombin Time: 14.3 seconds (ref 11.4–15.2)

## 2016-04-20 LAB — COMPREHENSIVE METABOLIC PANEL
ALBUMIN: 3.3 g/dL — AB (ref 3.5–5.0)
ALK PHOS: 69 U/L (ref 38–126)
ALT: 26 U/L (ref 17–63)
AST: 194 U/L — ABNORMAL HIGH (ref 15–41)
Anion gap: 7 (ref 5–15)
BUN: 9 mg/dL (ref 6–20)
CO2: 23 mmol/L (ref 22–32)
CREATININE: 0.79 mg/dL (ref 0.61–1.24)
Calcium: 8.9 mg/dL (ref 8.9–10.3)
Chloride: 104 mmol/L (ref 101–111)
GFR calc Af Amer: >60 mL/min (ref >60)
GFR calc non Af Amer: >60 mL/min (ref >60)
GLUCOSE: 242 mg/dL — AB (ref 65–99)
Potassium: 4.2 mmol/L (ref 3.5–5.1)
Sodium: 134 mmol/L — ABNORMAL LOW (ref 135–145)
Total Bilirubin: 0.7 mg/dL (ref 0.3–1.2)
Total Protein: 6.1 g/dL — ABNORMAL LOW (ref 6.5–8.1)

## 2016-04-20 LAB — CBC WITH DIFFERENTIAL/PLATELET
Basophils Absolute: 0 10*3/uL (ref 0.0–0.1)
Basophils Relative: 0 %
Eosinophils Absolute: 0.2 10*3/uL (ref 0.0–0.7)
Eosinophils Relative: 2 %
HCT: 45.8 % (ref 39.0–52.0)
Hemoglobin: 15.9 g/dL (ref 13.0–17.0)
Lymphocytes Relative: 12 %
Lymphs Abs: 1.1 10*3/uL (ref 0.7–4.0)
MCH: 31.6 pg (ref 26.0–34.0)
MCHC: 34.7 g/dL (ref 30.0–36.0)
MCV: 91.1 fL (ref 78.0–100.0)
Monocytes Absolute: 0.5 10*3/uL (ref 0.1–1.0)
Monocytes Relative: 5 %
Neutro Abs: 7.4 10*3/uL (ref 1.7–7.7)
Neutrophils Relative %: 81 %
Platelets: 138 10*3/uL — ABNORMAL LOW (ref 150–400)
RBC: 5.03 MIL/uL (ref 4.22–5.81)
RDW: 13.6 % (ref 11.5–15.5)
WBC: 9.2 10*3/uL (ref 4.0–10.5)

## 2016-04-20 LAB — TROPONIN I
Troponin I: 61.52 ng/mL (ref <0.03)
Troponin I: >65 ng/mL (ref <0.03)
Troponin I: >65 ng/mL (ref <0.03)

## 2016-04-20 LAB — MRSA PCR SCREENING: MRSA by PCR: NEGATIVE

## 2016-04-20 LAB — APTT: aPTT: >200 seconds (ref 24–36)

## 2016-04-20 SURGERY — LEFT HEART CATH AND CORONARY ANGIOGRAPHY
Anesthesia: LOCAL

## 2016-04-20 MED ORDER — VERAPAMIL HCL 2.5 MG/ML IV SOLN
INTRAVENOUS | Status: AC
  Filled 2016-04-20: qty 2

## 2016-04-20 MED ORDER — NITROGLYCERIN 1 MG/10 ML FOR IR/CATH LAB
INTRA_ARTERIAL | Status: AC
  Filled 2016-04-20: qty 10

## 2016-04-20 MED ORDER — HEPARIN SODIUM (PORCINE) 1000 UNIT/ML IJ SOLN
INTRAMUSCULAR | Status: AC
  Filled 2016-04-20: qty 1

## 2016-04-20 MED ORDER — HEPARIN (PORCINE) IN NACL 2-0.9 UNIT/ML-% IJ SOLN
INTRAMUSCULAR | Status: AC
  Filled 2016-04-20: qty 1000

## 2016-04-20 MED ORDER — IOPAMIDOL (ISOVUE-370) INJECTION 76%
INTRAVENOUS | Status: DC | PRN
Start: 2016-04-20 — End: 2016-04-20
  Administered 2016-04-20: 150 mL via INTRAVENOUS

## 2016-04-20 MED ORDER — TICAGRELOR 90 MG PO TABS
ORAL_TABLET | ORAL | Status: AC
  Filled 2016-04-20: qty 1

## 2016-04-20 MED ORDER — ACETAMINOPHEN 325 MG PO TABS: 650.0000 mg | ORAL_TABLET | ORAL | Status: DC | PRN

## 2016-04-20 MED ORDER — INFLUENZA VAC SPLIT QUAD 0.5 ML IM SUSY
0.5000 mL | PREFILLED_SYRINGE | INTRAMUSCULAR | Status: AC
  Administered 2016-04-21: 0.5 mL via INTRAMUSCULAR
  Filled 2016-04-20: qty 0.5

## 2016-04-20 MED ORDER — NITROGLYCERIN 0.4 MG SL SUBL: 0.4000 mg | SUBLINGUAL_TABLET | SUBLINGUAL | Status: DC | PRN

## 2016-04-20 MED ORDER — FENTANYL CITRATE (PF) 100 MCG/2ML IJ SOLN
INTRAMUSCULAR | Status: AC
  Filled 2016-04-20: qty 2

## 2016-04-20 MED ORDER — HYDRALAZINE HCL 20 MG/ML IJ SOLN: 5.0000 mg | INTRAMUSCULAR | Status: AC | PRN

## 2016-04-20 MED ORDER — FOLIC ACID 1 MG PO TABS
1.0000 mg | ORAL_TABLET | Freq: Every day | ORAL | Status: DC
Start: 2016-04-20 — End: 2016-04-22
  Administered 2016-04-20 – 2016-04-22 (×3): 1 mg via ORAL
  Filled 2016-04-20 (×3): qty 1

## 2016-04-20 MED ORDER — SODIUM CHLORIDE 0.9% FLUSH
3.0000 mL | Freq: Two times a day (BID) | INTRAVENOUS | Status: DC
  Administered 2016-04-20 – 2016-04-21 (×2): 3 mL via INTRAVENOUS

## 2016-04-20 MED ORDER — VERAPAMIL HCL 2.5 MG/ML IV SOLN
INTRAVENOUS | Status: DC | PRN
  Administered 2016-04-20: 10 mL via INTRA_ARTERIAL

## 2016-04-20 MED ORDER — HEPARIN (PORCINE) IN NACL 2-0.9 UNIT/ML-% IJ SOLN
INTRAMUSCULAR | Status: DC | PRN
  Administered 2016-04-20: 1000 mL

## 2016-04-20 MED ORDER — IOPAMIDOL (ISOVUE-370) INJECTION 76%
INTRAVENOUS | Status: AC
  Filled 2016-04-20: qty 100

## 2016-04-20 MED ORDER — TIROFIBAN HCL IN NACL 5-0.9 MG/100ML-% IV SOLN
INTRAVENOUS | Status: DC | PRN
  Administered 2016-04-20: 0.15 ug/kg/min via INTRAVENOUS

## 2016-04-20 MED ORDER — HEPARIN SODIUM (PORCINE) 1000 UNIT/ML IJ SOLN
INTRAMUSCULAR | Status: DC | PRN
Start: 2016-04-20 — End: 2016-04-20
  Administered 2016-04-20: 7000 [IU] via INTRAVENOUS

## 2016-04-20 MED ORDER — VITAMIN B-1 100 MG PO TABS
100.0000 mg | ORAL_TABLET | Freq: Every day | ORAL | Status: DC
  Administered 2016-04-20 – 2016-04-22 (×3): 100 mg via ORAL
  Filled 2016-04-20 (×3): qty 1

## 2016-04-20 MED ORDER — TIROFIBAN HCL IN NACL 5-0.9 MG/100ML-% IV SOLN
INTRAVENOUS | Status: AC
  Filled 2016-04-20: qty 100

## 2016-04-20 MED ORDER — NITROGLYCERIN 1 MG/10 ML FOR IR/CATH LAB
INTRA_ARTERIAL | Status: DC | PRN
  Administered 2016-04-20: 200 ug via INTRACORONARY

## 2016-04-20 MED ORDER — LIDOCAINE HCL (PF) 1 % IJ SOLN
INTRAMUSCULAR | Status: DC | PRN
  Administered 2016-04-20: 2 mL

## 2016-04-20 MED ORDER — ASPIRIN EC 81 MG PO TBEC
81.0000 mg | DELAYED_RELEASE_TABLET | Freq: Every day | ORAL | Status: DC
  Administered 2016-04-21 – 2016-04-22 (×2): 81 mg via ORAL
  Filled 2016-04-20 (×2): qty 1

## 2016-04-20 MED ORDER — MIDAZOLAM HCL 2 MG/2ML IJ SOLN
INTRAMUSCULAR | Status: AC
  Filled 2016-04-20: qty 2

## 2016-04-20 MED ORDER — TIROFIBAN HCL IN NACL 5-0.9 MG/100ML-% IV SOLN
0.1500 ug/kg/min | INTRAVENOUS | Status: AC
  Administered 2016-04-20: 0.15 ug/kg/min via INTRAVENOUS
  Filled 2016-04-20: qty 100

## 2016-04-20 MED ORDER — TICAGRELOR 90 MG PO TABS
ORAL_TABLET | ORAL | Status: AC
Start: 2016-04-20 — End: 2016-04-20
  Filled 2016-04-20: qty 1

## 2016-04-20 MED ORDER — TICAGRELOR 90 MG PO TABS
ORAL_TABLET | ORAL | Status: DC | PRN
  Administered 2016-04-20: 180 mg via ORAL

## 2016-04-20 MED ORDER — HEPARIN SODIUM (PORCINE) 5000 UNIT/ML IJ SOLN
5000.0000 [IU] | Freq: Three times a day (TID) | INTRAMUSCULAR | Status: DC
  Administered 2016-04-20 – 2016-04-22 (×5): 5000 [IU] via SUBCUTANEOUS
  Filled 2016-04-20 (×4): qty 1

## 2016-04-20 MED ORDER — ASPIRIN 81 MG PO CHEW: 81.0000 mg | CHEWABLE_TABLET | Freq: Every day | ORAL | Status: DC

## 2016-04-20 MED ORDER — LIDOCAINE HCL (PF) 1 % IJ SOLN
INTRAMUSCULAR | Status: AC
  Filled 2016-04-20: qty 30

## 2016-04-20 MED ORDER — FOLIC ACID 5 MG/ML IJ SOLN
1.0000 mg | Freq: Every day | INTRAMUSCULAR | Status: DC
  Filled 2016-04-20: qty 0.2

## 2016-04-20 MED ORDER — LABETALOL HCL 5 MG/ML IV SOLN: 10.0000 mg | INTRAVENOUS | Status: AC | PRN

## 2016-04-20 MED ORDER — LORAZEPAM 2 MG/ML IJ SOLN: 2.0000 mg | INTRAMUSCULAR | Status: DC | PRN

## 2016-04-20 MED ORDER — TIROFIBAN (AGGRASTAT) BOLUS VIA INFUSION
INTRAVENOUS | Status: DC | PRN
  Administered 2016-04-20: 1815 ug via INTRAVENOUS

## 2016-04-20 MED ORDER — ADULT MULTIVITAMIN W/MINERALS CH
1.0000 | ORAL_TABLET | Freq: Every day | ORAL | Status: DC
  Administered 2016-04-20 – 2016-04-22 (×3): 1 via ORAL
  Filled 2016-04-20 (×3): qty 1

## 2016-04-20 MED ORDER — TICAGRELOR 90 MG PO TABS
90.0000 mg | ORAL_TABLET | Freq: Two times a day (BID) | ORAL | Status: DC
  Administered 2016-04-20 – 2016-04-22 (×4): 90 mg via ORAL
  Filled 2016-04-20 (×4): qty 1

## 2016-04-20 MED ORDER — MIDAZOLAM HCL 2 MG/2ML IJ SOLN
INTRAMUSCULAR | Status: DC | PRN
  Administered 2016-04-20: 1 mg via INTRAVENOUS
  Administered 2016-04-20: 2 mg via INTRAVENOUS

## 2016-04-20 MED ORDER — IOPAMIDOL (ISOVUE-370) INJECTION 76%
INTRAVENOUS | Status: AC
  Filled 2016-04-20: qty 125

## 2016-04-20 MED ORDER — CARVEDILOL 3.125 MG PO TABS
3.1250 mg | ORAL_TABLET | Freq: Two times a day (BID) | ORAL | Status: DC
  Administered 2016-04-20 – 2016-04-21 (×4): 3.125 mg via ORAL
  Filled 2016-04-20 (×4): qty 1

## 2016-04-20 MED ORDER — FENTANYL CITRATE (PF) 100 MCG/2ML IJ SOLN
INTRAMUSCULAR | Status: DC | PRN
  Administered 2016-04-20: 25 ug via INTRAVENOUS
  Administered 2016-04-20: 50 ug via INTRAVENOUS

## 2016-04-20 MED ORDER — ONDANSETRON HCL 4 MG/2ML IJ SOLN: 4.0000 mg | Freq: Four times a day (QID) | INTRAMUSCULAR | Status: DC | PRN

## 2016-04-20 MED ORDER — PNEUMOCOCCAL VAC POLYVALENT 25 MCG/0.5ML IJ INJ: 0.5000 mL | INJECTION | INTRAMUSCULAR | Status: DC

## 2016-04-20 MED ORDER — ATORVASTATIN CALCIUM 80 MG PO TABS
80.0000 mg | ORAL_TABLET | Freq: Every day | ORAL | Status: DC
Start: 2016-04-20 — End: 2016-04-22
  Administered 2016-04-20 – 2016-04-21 (×2): 80 mg via ORAL
  Filled 2016-04-20 (×2): qty 1

## 2016-04-20 MED ORDER — SODIUM CHLORIDE 0.9% FLUSH: 3.0000 mL | INTRAVENOUS | Status: DC | PRN

## 2016-04-20 MED ORDER — SODIUM CHLORIDE 0.9 % IV SOLN: INTRAVENOUS | Status: AC

## 2016-04-20 MED ORDER — SODIUM CHLORIDE 0.9 % IV SOLN: 250.0000 mL | INTRAVENOUS | Status: DC | PRN

## 2016-04-20 MED ORDER — SODIUM CHLORIDE 0.9 % IV SOLN
INTRAVENOUS | Status: DC | PRN
  Administered 2016-04-20: 250 mL via INTRAVENOUS

## 2016-04-20 SURGICAL SUPPLY — 20 items
BALLN MOZEC 2.50X14 (BALLOONS) ×2
BALLN ~~LOC~~ MOZEC 2.75X18 (BALLOONS) ×2
BALLOON MOZEC 2.50X14 (BALLOONS) ×1 IMPLANT
BALLOON ~~LOC~~ MOZEC 2.75X18 (BALLOONS) ×1 IMPLANT
CATH INFINITI 5FR ANG PIGTAIL (CATHETERS) ×2 IMPLANT
CATH OPTITORQUE TIG 4.0 5F (CATHETERS) ×2 IMPLANT
CATH VISTA GUIDE 6FR JR4 (CATHETERS) ×2 IMPLANT
DEVICE RAD COMP TR BAND LRG (VASCULAR PRODUCTS) ×2 IMPLANT
ELECT DEFIB PAD ADLT CADENCE (PAD) ×2 IMPLANT
GLIDESHEATH SLEND A-KIT 6F 22G (SHEATH) ×2 IMPLANT
GUIDEWIRE INQWIRE 1.5J.035X260 (WIRE) ×1 IMPLANT
INQWIRE 1.5J .035X260CM (WIRE) ×2
KIT ENCORE 26 ADVANTAGE (KITS) ×2 IMPLANT
KIT HEART LEFT (KITS) ×2 IMPLANT
PACK CARDIAC CATHETERIZATION (CUSTOM PROCEDURE TRAY) ×2 IMPLANT
STENT SYNERGY DES 2.5X28 (Permanent Stent) ×2 IMPLANT
SYR MEDRAD MARK V 150ML (SYRINGE) ×2 IMPLANT
TRANSDUCER W/STOPCOCK (MISCELLANEOUS) ×2 IMPLANT
TUBING CIL FLEX 10 FLL-RA (TUBING) ×2 IMPLANT
WIRE ASAHI PROWATER 180CM (WIRE) ×2 IMPLANT

## 2016-04-20 NOTE — Interval H&P Note (Signed)
History and Physical Interval Note:  04/20/2016 6:13 AM  Craig Perry  has presented today for surgery, with the diagnosis of Inferior stemi  The various methods of treatment have been discussed with the patient and family. After consideration of risks, benefits and other options for treatment, the patient has consented to  Procedure(s): Left Heart Cath and Coronary Angiography (N/A) Coronary Stent Intervention (N/A) as a surgical intervention .  The patient's history has been reviewed, patient examined, no change in status, stable for surgery.  I have reviewed the patient's chart and labs.  Questions were answered to the patient's satisfaction.      Bryan Lemmaavid Harding

## 2016-04-20 NOTE — Progress Notes (Signed)
  Patient stable post-PCI. Continue current plan of care including CIWA protocol.   Eilidh Marcano,MD 12:52 PM

## 2016-04-20 NOTE — H&P (Addendum)
Cardiology History & Physical    Patient ID: Craig Skeenhomas W Fenner MRN: [id number], DOB: 1960-08-07 Date of Encounter: 04/20/2016, 5:23 AM Primary Physician: No PCP Per Patient  Chief Complaint: Chest pain Reason for Admission: STEMI   HPI: Craig Perry is a 56 y.o. male with history of alcoholic cardiomyopathy (EF 35% in 03/2013, previously was drinking 36 beers daily), active 4 PPD smoker, who presents with chest pain.  Pt began having SSCP at rest yesterday afternoon.  [facility] and had an initial ECG showing ST and labs drawn.  Troponin was 0.03 and the remainder of his labs were unremakrable.  His CP resolved, so he left the ED without actually seeing a provider.  He woke up with CP this AM that was 7-8/10 in intensity, so he called EMS.  EMS ECG showed inferior ST elevations and a code STEMI was activated.  Pt was hypertensive en route, and received full dose ASA and 1 L IVF.  Pt reports not taking any medications and has not been following up with any physicians despite his history of cardiomyopathy.  He did have a cath in 2014, which showed non-obstructive CAD.  In the cath lab, pt was found to have 100% mRCA occlusion, for which he received DES x 1.  There was significant residual disease present in the mLAD.  LV gram showed EF in the 45-50% range with inferior hypokinesis.  180 mg ticagrelor and eptifibatide were given during the procedure.  He was then admitted to the CCU for further management.  Past Medical History:  Diagnosis Date  . Alcohol abuse   . Hypertension   . Myocardial infarction   . Pneumothorax   . Stroke [facility](HCC)      Surgical History:  Past Surgical History:  Procedure Laterality Date  . ABDOMINAL SURGERY    . COLON SURGERY    . LEFT HEART CATHETERIZATION WITH CORONARY ANGIOGRAM N/A 03/08/2013   Procedure: LEFT HEART CATHETERIZATION WITH CORONARY ANGIOGRAM;  Surgeon: Micheline ChapmanMichael D Cooper, MD;  [facility];  Service:  Cardiovascular;  Laterality: N/A;  . LIVER SURGERY    . SPLENECTOMY       Home Meds: Prior to Admission medications   Medication Sig Start Date End Date Taking? Authorizing Provider  carvedilol (COREG) 3.125 MG tablet Take 1 tablet (3.125 mg total) by mouth 2 (two) times daily with a meal. 09/27/13   Rolland PorterMark James, MD  nitroGLYCERIN (NITROSTAT) 0.4 MG SL tablet Place 0.4 mg under the tongue every 5 (five) minutes as needed for chest pain.    Historical Provider, MD  predniSONE (DELTASONE) 20 MG tablet 3 tabs po day one, then 2 po daily x 4 days 07/23/14   Loren Raceravid Yelverton, MD    Allergies: No Known Allergies  Social History   Social History  . Marital status: Single    Spouse name: N/A  . Number of children: N/A  . Years of education: N/A   Occupational History  . Not on file.   Social History Main Topics  . Smoking status: Current Every Day Smoker    Packs/day: 4.00    Years: 45.00    Types: Cigarettes  . Smokeless tobacco: Never Used  . Alcohol use 7.2 oz/week    12 Cans of beer per week     Comment: daily 48 beers  . Drug use: No  . Sexual activity: Not on file   Other Topics Concern  . Not on file  Social History Narrative  . No narrative on file     No family history on file.  Review of Systems  All other systems reviewed and are otherwise negative except as noted above.  Labs:   Lab Results  Component Value Date   WBC 7.3 04/19/2016   HGB 16.0 04/19/2016   HCT 46.4 04/19/2016   MCV 91.7 04/19/2016   PLT 157 04/19/2016    Recent Labs Lab 04/19/16 1507  NA 138  K 3.8  CL 102  CO2 28  BUN 15  CREATININE 1.22  CALCIUM 9.9  GLUCOSE 117*    Recent Labs  04/19/16 1507  TROPONINI 0.03*   Lab Results  Component Value Date   CHOL 166 03/08/2013   HDL 41 03/08/2013   LDLCALC 94 03/08/2013   TRIG 155 (H) 03/08/2013   No results found for: DDIMER  Radiology/Studies:  Dg Chest 2 View  Result Date: 04/19/2016 CLINICAL DATA:  Chest pain.  EXAM: CHEST  2 VIEW COMPARISON:  07/22/2014. FINDINGS: Mediastinum and hilar structures are normal. Lungs are clear. Symmetric bilateral solitary nodular opacities noted over the lung bases consistent with nipple shadows. Similar findings on prior exam. Biapical pleural thickening noted consistent scarring. No acute bony abnormality . IMPRESSION: No acute cardiopulmonary disease. Electronically Signed   By: Maisie Fus  Register   On: 04/19/2016 15:09   Wt Readings from Last 3 Encounters:  04/19/16 72.6 kg (160 lb)  12/25/14 66.7 kg (147 lb)  07/22/14 68 kg (150 lb)    EKG: Sinus tachycardia, inferior STEMI.  Physical Exam: SpO2 98 %. There is no height or weight on file to calculate BMI. General: Disheveled, EtOH on breath.  Dense beard Lungs: Clear bilaterally to auscultation without wheezes, rales, or rhonchi. Breathing is unlabored. Heart: Tachycardic, regular, with S1 S2. No murmurs, rubs, or gallops appreciated. Abdomen: Soft, non-tender, non-distended with normoactive bowel sounds. No hepatomegaly. No rebound/guarding. No obvious abdominal masses. Msk:  Strength and tone appear normal for age. Extremities: No clubbing or cyanosis. No edema.  Distal pedal pulses are 2+ and equal bilaterally. Neuro: Alert and oriented X 3. No focal deficit. No facial asymmetry. Moves all extremities spontaneously. Psych:  Responds to questions appropriately with a normal affect.    Assessment and Plan  56 y.o. male with history of alcoholic cardiomyopathy (EF 35% in 03/2013, previously was drinking 36 beers daily), active 4 PPD smoker, who presents with chest pain, found to have inferior STEMI.  1. STEMI: S/p DES to RCA.  Significant residual disease present in LAD.  -Continue DAPT with ASA and ticagrelor -Will obtain TTE this morning -Plan to initiate beta blockade, ACE-I as tolerated by BP -Check lipids, A1c -Consider PCI of LAD prior to discharge  2.  Smoking cessation:  -Placed consult for aid in  smoking cessation -Consider pharmacologic therapy prior to d/c  3.  Significant EtOH abuse history:  Last drink a few hours PTA. -Monitor with Ativan per CIWA protocol -Folate, thiamine, MVI  Signed, Esmond Plants MD 04/20/2016, 5:23 AM

## 2016-04-20 NOTE — Progress Notes (Signed)
Cardiac Rehab Progress Note: Patient in bed with TR band intact. Education completed. Patient receptive to education however he states he is not interest in any lifestyle changes. Patient continues to work as a [employer]at[employer]ic and states he will return to work on Monday or Tuesday even though cautioned by RN. Diet, Activity Progression, MI booklet, Phase 2 rehab, and importance of Anti-platelet. Patient is a know non-compliant alcoholic. Pleasant, cooperative. States he is un-insured and cant afford his medications. He will not consider phase 2 rehab because of his employment. Primary RN and MD notified. MD educated patient about [facility] [place] and years supply of antiplatelet. Patient to be discharged in am. Encouraged patient to ambulate later today.

## 2016-04-21 ENCOUNTER — Inpatient Hospital Stay (HOSPITAL_COMMUNITY): Payer: Self-pay

## 2016-04-21 DIAGNOSIS — I2109 ST elevation (STEMI) myocardial infarction involving other coronary artery of anterior wall: Secondary | ICD-10-CM

## 2016-04-21 DIAGNOSIS — I426 Alcoholic cardiomyopathy: Secondary | ICD-10-CM

## 2016-04-21 LAB — BASIC METABOLIC PANEL
Anion gap: 7 (ref 5–15)
BUN: 11 mg/dL (ref 6–20)
CHLORIDE: 107 mmol/L (ref 101–111)
CO2: 23 mmol/L (ref 22–32)
CREATININE: 0.83 mg/dL (ref 0.61–1.24)
Calcium: 9.2 mg/dL (ref 8.9–10.3)
GFR calc Af Amer: >60 mL/min (ref >60)
GLUCOSE: 118 mg/dL — AB (ref 65–99)
POTASSIUM: 4 mmol/L (ref 3.5–5.1)
SODIUM: 137 mmol/L (ref 135–145)

## 2016-04-21 LAB — LIPID PANEL
Cholesterol: 174 mg/dL (ref 0–200)
HDL: 35 mg/dL — ABNORMAL LOW (ref >40)
LDL Cholesterol: 109 mg/dL — ABNORMAL HIGH (ref 0–99)
Total CHOL/HDL Ratio: 5 RATIO
Triglycerides: 148 mg/dL (ref <150)
VLDL: 30 mg/dL (ref 0–40)

## 2016-04-21 LAB — ECHOCARDIOGRAM COMPLETE
Height: 71 in
Weight: 2536.17 oz

## 2016-04-21 LAB — CBC
HEMATOCRIT: 45.4 % (ref 39.0–52.0)
Hemoglobin: 15.3 g/dL (ref 13.0–17.0)
MCH: 30.9 pg (ref 26.0–34.0)
MCHC: 33.7 g/dL (ref 30.0–36.0)
MCV: 91.7 fL (ref 78.0–100.0)
PLATELETS: 146 10*3/uL — AB (ref 150–400)
RBC: 4.95 MIL/uL (ref 4.22–5.81)
RDW: 13.3 % (ref 11.5–15.5)
WBC: 8.4 10*3/uL (ref 4.0–10.5)

## 2016-04-21 LAB — HEMOGLOBIN A1C
Hgb A1c MFr Bld: 5.9 % — ABNORMAL HIGH (ref 4.8–5.6)
Mean Plasma Glucose: 123 mg/dL

## 2016-04-21 LAB — TROPONIN I: Troponin I: 44.44 ng/mL (ref <0.03)

## 2016-04-21 MED ORDER — ALPRAZOLAM 0.25 MG PO TABS
0.2500 mg | ORAL_TABLET | Freq: Two times a day (BID) | ORAL | Status: DC
  Administered 2016-04-21 – 2016-04-22 (×3): 0.25 mg via ORAL
  Filled 2016-04-21 (×3): qty 1

## 2016-04-21 NOTE — Progress Notes (Signed)
RN spoke with patient and patient advised RN that if resources are provided/available, he will take his Brilinta.  Patient stated it is a financial concern that would prohibit him from getting medication, patient stated if financial resources are available he will take medication.

## 2016-04-21 NOTE — Progress Notes (Signed)
Subjective:   Doing well. Denies CP or SOB. Anxious to go home.      Intake/Output Summary (Last 24 hours) at 04/21/16 1400 Last data filed at 04/21/16 1000  Gross per 24 hour  Intake              600 ml  Output              650 ml  Net              -50 ml    Current meds: . aspirin EC  81 mg Oral Daily  . atorvastatin  80 mg Oral q1800  . carvedilol  3.125 mg Oral BID WC  . folic acid  1 mg Oral Daily  . heparin  5,000 Units Subcutaneous Q8H  . multivitamin with minerals  1 tablet Oral Daily  . pneumococcal 23 valent vaccine  0.5 mL Intramuscular Tomorrow-1000  . thiamine  100 mg Oral Daily  . ticagrelor  90 mg Oral BID   Infusions:    Objective:  Blood pressure 97/79, pulse (!) 110, temperature 97.6 F (36.4 C), temperature source Oral, resp. rate (!) 30, height 5\' 11"  (1.803 m), weight 71.9 kg (158 lb 8.2 oz), SpO2 96 %. Weight change: -1.4 kg (-3 lb 1.4 oz)  Physical Exam: General:  Sitting on side of bed eating lunch HEENT: normal large beard Neck: supple. . Carotids 2+ bilat; no bruits. No lymphadenopathy or thryomegaly appreciated. Cor: PMI nondisplaced. Regular rate & rhythm. No rubs, gallops or murmurs. Lungs: clear with decreased BS Abdomen: soft, nontender, nondistended. No hepatosplenomegaly. No bruits or masses. Good bowel sounds. Extremities: no cyanosis, clubbing, rash, edema Neuro: alert & orientedx3, cranial nerves grossly intact. moves all 4 extremities w/o difficulty. Affect pleasant  Telemetry: Sinus tach 100-105  Lab Results: Basic Metabolic Panel:  Recent Labs Lab 04/19/16 1507 04/20/16 1002 04/21/16 0310  NA 138 134* 137  K 3.8 4.2 4.0  CL 102 104 107  CO2 28 23 23   GLUCOSE 117* 242* 118*  BUN 15 9 11   CREATININE 1.22 0.79 0.83  CALCIUM 9.9 8.9 9.2   Liver Function Tests:  Recent Labs Lab 04/20/16 1002  AST 194*  ALT 26  ALKPHOS 69  BILITOT 0.7  PROT 6.1*  ALBUMIN 3.3*   No results for input(s): LIPASE, AMYLASE in  the last 168 hours. No results for input(s): AMMONIA in the last 168 hours. CBC:  Recent Labs Lab 04/19/16 1507 04/20/16 0711 04/21/16 0310  WBC 7.3 9.2 8.4  NEUTROABS  --  7.4  --   HGB 16.0 15.9 15.3  HCT 46.4 45.8 45.4  MCV 91.7 91.1 91.7  PLT 157 138* 146*   Cardiac Enzymes:  Recent Labs Lab 04/19/16 1507 04/20/16 1002 04/20/16 1510 04/20/16 1807 04/21/16 0004  TROPONINI 0.03* 61.52* >65.00* >65.00* 44.44*   BNP: Invalid input(s): POCBNP CBG: No results for input(s): GLUCAP in the last 168 hours. Microbiology: No results found for: CULT No results for input(s): CULT, SDES in the last 168 hours.  Imaging: Dg Chest 2 View  Result Date: 04/19/2016 CLINICAL DATA:  Chest pain. EXAM: CHEST  2 VIEW COMPARISON:  07/22/2014. FINDINGS: Mediastinum and hilar structures are normal. Lungs are clear. Symmetric bilateral solitary nodular opacities noted over the lung bases consistent with nipple shadows. Similar findings on prior exam. Biapical pleural thickening noted consistent scarring. No acute bony abnormality . IMPRESSION: No acute cardiopulmonary disease. Electronically Signed   By: Maisie Fus  Register   On:  04/19/2016 15:09   Patient ID:  56 y.o. 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 Underwent PCI RCA on 1/20. EF 45-50%.   ASSESSMENT:  1. Inferior STEMI/CAD --s/p PCI RCA on 1/20. EF 45-50% by cath. Echo pending --Residual LAD 70% 2. ETOH and tobacco abuse 3. H/o ETOH CM   PLAN/DISCUSSION:  Doing well post-MI. Continue ASA, statin, Brillinta. Titrate carvedilol. Await echo.   Mildly tachy but denies agitation. Will place on CIWA protocol.  [facility] Transfer tele.    LOS: 1 day    Arvilla MeresBensimhon, Daniel, MD 04/21/2016, 2:00 PM

## 2016-04-21 NOTE — Progress Notes (Signed)
  Echocardiogram 2D Echocardiogram has been performed.  Leta JunglingCooper, Delbra Zellars M 04/21/2016, 12:36 PM

## 2016-04-21 NOTE — Progress Notes (Signed)
Patient ambulated in hall 1000 feet.  Vital signs stable.  No complaints of pain.  Patient back to room and sitting in the chair with call bell in reach.

## 2016-04-22 ENCOUNTER — Encounter (HOSPITAL_COMMUNITY): Payer: Self-pay | Admitting: Cardiology

## 2016-04-22 ENCOUNTER — Telehealth: Payer: Self-pay

## 2016-04-22 DIAGNOSIS — I10 Essential (primary) hypertension: Secondary | ICD-10-CM | POA: Diagnosis present

## 2016-04-22 LAB — POCT I-STAT 3, ART BLOOD GAS (G3+)
ACID-BASE DEFICIT: 7 mmol/L — AB (ref 0.0–2.0)
Bicarbonate: 21.1 mmol/L (ref 20.0–28.0)
O2 Saturation: 93 %
PH ART: 7.23 — AB (ref 7.350–7.450)
TCO2: 23 mmol/L (ref 0–100)
pCO2 arterial: 50.4 mmHg — ABNORMAL HIGH (ref 32.0–48.0)
pO2, Arterial: 82 mmHg — ABNORMAL LOW (ref 83.0–108.0)

## 2016-04-22 LAB — POCT I-STAT, CHEM 8
BUN: 16 mg/dL (ref 6–20)
CREATININE: 0.8 mg/dL (ref 0.61–1.24)
Calcium, Ion: 1.23 mmol/L (ref 1.15–1.40)
Chloride: 103 mmol/L (ref 101–111)
Glucose, Bld: 141 mg/dL — ABNORMAL HIGH (ref 65–99)
HEMATOCRIT: 44 % (ref 39.0–52.0)
HEMOGLOBIN: 15 g/dL (ref 13.0–17.0)
Potassium: 4 mmol/L (ref 3.5–5.1)
Sodium: 139 mmol/L (ref 135–145)
TCO2: 26 mmol/L (ref 0–100)

## 2016-04-22 LAB — POCT ACTIVATED CLOTTING TIME: ACTIVATED CLOTTING TIME: 362 s

## 2016-04-22 MED ORDER — CARVEDILOL 6.25 MG PO TABS
6.2500 mg | ORAL_TABLET | Freq: Two times a day (BID) | ORAL | Status: DC
  Administered 2016-04-22: 6.25 mg via ORAL
  Filled 2016-04-22: qty 1

## 2016-04-22 MED ORDER — CARVEDILOL 6.25 MG PO TABS: 3.1250 mg | ORAL_TABLET | Freq: Two times a day (BID) | ORAL | 1 refills | Status: DC

## 2016-04-22 MED ORDER — CARVEDILOL 6.25 MG PO TABS: 6.2500 mg | ORAL_TABLET | Freq: Two times a day (BID) | ORAL | 1 refills | Status: DC

## 2016-04-22 MED ORDER — SPIRONOLACTONE 25 MG PO TABS: 12.5000 mg | ORAL_TABLET | Freq: Every day | ORAL | 1 refills | Status: DC

## 2016-04-22 MED ORDER — TICAGRELOR 90 MG PO TABS: 90.0000 mg | ORAL_TABLET | Freq: Two times a day (BID) | ORAL | 4 refills | Status: DC

## 2016-04-22 MED ORDER — ASPIRIN 81 MG PO TBEC: 81.0000 mg | DELAYED_RELEASE_TABLET | Freq: Every day | ORAL | Status: DC

## 2016-04-22 MED ORDER — LOSARTAN POTASSIUM 25 MG PO TABS: 25.0000 mg | ORAL_TABLET | Freq: Every day | ORAL | 1 refills | Status: DC

## 2016-04-22 MED ORDER — NITROGLYCERIN 0.4 MG SL SUBL: 0.4000 mg | SUBLINGUAL_TABLET | SUBLINGUAL | 12 refills | Status: DC | PRN

## 2016-04-22 MED ORDER — FOLIC ACID 1 MG PO TABS: 1.0000 mg | ORAL_TABLET | Freq: Every day | ORAL | 1 refills | Status: DC

## 2016-04-22 MED ORDER — THIAMINE HCL 100 MG PO TABS: 100.0000 mg | ORAL_TABLET | Freq: Every day | ORAL | 1 refills | Status: DC

## 2016-04-22 MED ORDER — LOSARTAN POTASSIUM 25 MG PO TABS
25.0000 mg | ORAL_TABLET | Freq: Every day | ORAL | Status: DC
  Administered 2016-04-22: 25 mg via ORAL
  Filled 2016-04-22: qty 1

## 2016-04-22 MED ORDER — ATORVASTATIN CALCIUM 80 MG PO TABS: 80.0000 mg | ORAL_TABLET | Freq: Every day | ORAL | 1 refills | Status: DC

## 2016-04-22 MED ORDER — SPIRONOLACTONE 25 MG PO TABS
12.5000 mg | ORAL_TABLET | Freq: Every day | ORAL | Status: DC
  Administered 2016-04-22: 12.5 mg via ORAL
  Filled 2016-04-22: qty 1

## 2016-04-22 MED FILL — Heparin Sodium (Porcine) Inj 1000 Unit/ML: INTRAMUSCULAR | Qty: 10 | Status: AC

## 2016-04-22 NOTE — Care Management Note (Signed)
Case Management Note  Patient Details  Name: Craig Perry MRN: [id number] Date of Birth: [birth year]  Subjective/Objective: Pt presented for Stemi- Pt is without insurance. Per pt he was working, however not sure if he will be able to continue. [place] and is [place]CP.                   Action/Plan: CM [facility] placed appointment on [facility]. CM did Prov[facility]- meds to cost $3.00. He is aware to go [facility] [address] in [place]. No further needs from CM @ this time.    Expected Discharge Date:  04/22/16               Expected Discharge Plan:  Home/Self Care  In-House Referral:  NA  Discharge planning Services  CM Consult, Follow-up appt scheduled, [facility], Medication Assistance  Post Acute Care Choice:  NA Choice offered to:  Patient  DME Arranged:  N/A DME Agency:  NA  HH Arranged:  NA HH Agency:  NA  Status of Service:  Completed, signed off  If discussed at Long Length of Stay Meetings, dates discussed:    Additional Comments:  Gala LewandowskyGraves-Bigelow, Cyndie Woodbeck Kaye, RN 04/22/2016, 10:16 AM

## 2016-04-22 NOTE — Telephone Encounter (Signed)
-----   Message from Janetta HoraKathryn R Thompson, PA-C sent at 04/22/2016 11:32 AM EST ----- Needs a TOC call for [facility] appt on 1/29

## 2016-04-22 NOTE — Discharge Instructions (Signed)
Heart Attack °A heart attack (myocardial infarction, MI) causes damage to your heart that cannot be fixed. A heart attack can happen when a heart (coronary) artery becomes blocked or narrowed. This cuts off the blood supply and oxygen to your heart. °When one or more of your coronary arteries becomes blocked, that area of your heart begins to die. This causes the pain you feel during a heart attack. Heart attack pain can also occur in one part of the body but be felt in another part of the body (referred pain). You may feel referred heart attack pain in your left arm, neck, or jaw. Pain may even be felt in the right arm. °What are the causes? °Many conditions can cause a heart attack. These include: °· Atherosclerosis. This is when a fatty substance (plaque) gradually builds up in the arteries. This buildup can block or reduce the blood supply to one or more of the heart arteries. °· A blood clot. A blood clot can develop suddenly when plaque breaks up (ruptures) within a heart artery. A blood clot can block the heart artery, which prevents blood flow to the heart. °· Severe tightening (spasm) of the heart artery. This cuts off blood flow through the artery. °What increases the risk? °  °People at risk for heart attack usually have one or more of the following risk factors: °· High blood pressure (hypertension). °· High cholesterol. °· Smoking. °· Being male. °· Being overweight or obese. °· Older aged. °· A family history of heart disease. °· Lack of exercise. °· Diabetes. °· Stress. °· Drinking too much alcohol. °· Using illegal street drugs, such as cocaine and methamphetamines. °What are the signs or symptoms? °Heart attack symptoms can vary from person to person. Symptoms depend on factors like gender and age. °· In both men and women, heart attack symptoms can include the following: °¨ Chest pain. This may feel like crushing, squeezing, or a feeling of pressure. °¨ Shortness of breath. °¨ Heartburn or  indigestion with or without vomiting, shortness of breath, or sweating. °¨ Sudden cold sweats. °¨ Sudden light-headedness. °¨ Upper back pain. °· Women can have unique heart attack symptoms, such as: °¨ Unexplained feelings of nervousness or anxiety. °¨ Discomfort between the shoulder blades or upper back. °¨ Tingling in the hands and arms. °· Older people (of both genders) can have subtle heart attack symptoms, such as: °¨ Sweating. °¨ Shortness of breath. °¨ General tiredness or not feeling well. °How is this diagnosed? °Diagnosing a heart attack involves several tests. They include: °· An assessment of your vital signs. This includes checking your: °¨ Heart rhythm. °¨ Blood pressure. °¨ Breathing rate. °¨ Oxygen level. °· An ECG (electrocardiogram) to measure the electrical activity of your heart. °· Blood tests called cardiac markers. In these tests, blood is drawn at scheduled times to check for the specific proteins or enzymes released by damaged heart muscle. °· A chest X-ray. °· An echocardiogram to evaluate heart motion and blood flow. °· Coronary angiography to look at the heart arteries. °How is this treated? °Treatment for a heart attack may include: °· Medicine that breaks up or dissolves blood clots in the heart artery. °· Angioplasty. °· Cardiac stent placement. °· Intra-aortic balloon pump therapy (IABP). °· Open heart surgery (coronary artery bypass graft, CABG). °Follow these instructions at home: °· Take medicines only as directed by your health care provider. You may need to take medicine after a heart attack to: °¨ Keep your blood from clotting   too easily. °¨ Control your blood pressure. °¨ Lower your cholesterol. °¨ Control abnormal heart rhythms. °· Do not take the following medicines unless your health care provider approves: °¨ Nonsteroidal anti-inflammatory drugs (NSAIDs), such as ibuprofen, naproxen, or celecoxib. °¨ Vitamin supplements that contain vitamin A, vitamin E, or  both. °¨ Hormone replacement therapy that contains estrogen with or without progestin. °· Make lifestyle changes as directed by your health care provider. These may include: °¨ Quitting smoking, if you smoke. °¨ Getting regular exercise. Ask your health care provider to suggest some activities that are safe for you. °¨ Eating a heart-healthy diet. A registered dietitian can help you learn healthy eating options. °¨ Maintaining a healthy weight. °¨ Managing diabetes, if necessary. °¨ Reducing stress. °¨ Limiting how much alcohol you drink. °Get help right away if: °· You have sudden, unexplained chest discomfort. °· You have sudden, unexplained discomfort in your arms, back, neck, or jaw. °· You have shortness of breath at any time. °· You suddenly start to sweat or your skin gets clammy. °· You feel nauseous or vomit. °· You suddenly feel light-headed or dizzy. °· Your heart begins to beat fast or feels like it is skipping beats. °These symptoms may represent a serious problem that is an emergency. Do not wait to see if the symptoms will go away. Get medical help right away. Call your local emergency services (911 in the [place] Do not drive yourself to the hospital.  °This information is not intended to replace advice given to you by your health care provider. Make sure you discuss any questions you have with your health care provider. °Document Released: 03/18/2005 Document Revised: 08/24/2015 Document Reviewed: 05/21/2013 °Elsevier Interactive Patient Education © 2017 Elsevier Inc. ° ° °Heart Disease Prevention °Heart disease is a leading cause of death. There are many things you can do to help prevent heart disease. °Be physically active °Physical activity is good for your heart. It helps control your blood pressure, cholesterol levels, and weight. Try to be physically active every day. Ask your health care provider what activities are best for you. °Be a healthy weight °Extra weight can strain your heart and  affect your blood pressure and cholesterol levels. Lose weight with diet and exercise if recommended by your health care provider. °Eat heart-healthy foods °Follow a healthy eating plan as recommended by your health care provider or dietitian. Heart-healthy foods include: °· High-fiber foods. These include oat bran, oatmeal, and whole-grain breads and cereals. °· Fruits and vegetables. °Avoid: °· Alcohol. °· Fried foods. °· Foods high in saturated fat. These include meats, butter, whole dairy products, shortening, and coconut or palm oil. °· Salty foods. These include canned food, luncheon meat, salty snacks, and fast food. °Keep your cholesterol levels under control °Cholesterol is a substance that is used for many important functions. When your cholesterol levels are high, cholesterol can stick to the insides of your blood vessels, making them narrow or clog. This can lead to chest pain (angina) and a heart attack. °Keep your cholesterol levels under control as recommended by your health care provider. Have your cholesterol checked at least once a year. Target cholesterol levels (in mg/dL) for most people are: °· Total cholesterol below 200. °· LDL cholesterol below 100. °· HDL cholesterol above 40 in men and above 50 in women. °· Triglycerides below 150. °Keep your blood pressure under control °Having high blood pressure (hypertension) puts you at risk for stroke and other forms of heart disease. Keep your   blood pressure under control as recommended by your health care provider. Ask your health care provider if you need treatment to lower your blood pressure. If you are 18-39 years of age, have your blood pressure checked every 3-5 years. If you are 40 years of age or older, have your blood pressure checked every year. °Do not use tobacco products °Tobacco smoke can damage your heart and blood vessels. Do not use any tobacco products including cigarettes, chewing tobacco, or electronic cigarettes. If you need  help quitting, ask your health care provider. °Take medicines as directed °Take medicines only as directed by your health care provider. Ask your health care provider whether you should take an aspirin every day. Taking aspirin can help reduce your risk of heart disease and stroke. °Where to find more information: °To find out more about heart disease, visit the American Heart Association's website at www.americanheart.org °This information is not intended to replace advice given to you by your health care provider. Make sure you discuss any questions you have with your health care provider. °Document Released: 10/31/2003 Document Revised: 08/16/2015 Document Reviewed: 05/12/2013 °Elsevier Interactive Patient Education © 2017 Elsevier Inc. ° °

## 2016-04-22 NOTE — Progress Notes (Addendum)
Subjective:   Insists on going home Discussed ETOH abuse "I'm doing better use to drink 40 beers/day Now just 12 last week"      Intake/Output Summary (Last 24 hours) at 04/22/16 0815 Last data filed at 04/21/16 1800  Gross per 24 hour  Intake             1200 ml  Output                0 ml  Net             1200 ml    Current meds: . ALPRAZolam  0.25 mg Oral BID  . aspirin EC  81 mg Oral Daily  . atorvastatin  80 mg Oral q1800  . carvedilol  3.125 mg Oral BID WC  . folic acid  1 mg Oral Daily  . heparin  5,000 Units Subcutaneous Q8H  . multivitamin with minerals  1 tablet Oral Daily  . pneumococcal 23 valent vaccine  0.5 mL Intramuscular Tomorrow-1000  . thiamine  100 mg Oral Daily  . ticagrelor  90 mg Oral BID   Infusions:    Objective:  Blood pressure 109/76, pulse 94, temperature 97.9 F (36.6 C), temperature source Oral, resp. rate (!) 23, height 5\' 11"  (1.803 m), weight 157 lb 4.8 oz (71.4 kg), SpO2 97 %. Weight change: -1 lb 3.4 oz (-0.549 kg)  Physical Exam: General:  Sitting on side of bed eating lunch HEENT: normal large beard Neck: supple. . Carotids 2+ bilat; no bruits. No lymphadenopathy or thryomegaly appreciated. Cor: PMI nondisplaced. Regular rate & rhythm. No rubs, gallops or murmurs. Lungs: clear with decreased BS Abdomen: soft, nontender, nondistended. No hepatosplenomegaly. No bruits or masses. Good bowel sounds. Extremities: no cyanosis, clubbing, rash, edema Neuro: alert & orientedx3, cranial nerves grossly intact. moves all 4 extremities w/o difficulty. Affect pleasant  Telemetry: Sinus tach 100-105  Lab Results: Basic Metabolic Panel:  Recent Labs Lab 04/19/16 1507 04/20/16 1002 04/21/16 0310  NA 138 134* 137  K 3.8 4.2 4.0  CL 102 104 107  CO2 28 23 23   GLUCOSE 117* 242* 118*  BUN 15 9 11   CREATININE 1.22 0.79 0.83  CALCIUM 9.9 8.9 9.2   Liver Function Tests:  Recent Labs Lab 04/20/16 1002  AST 194*  ALT 26  ALKPHOS  69  BILITOT 0.7  PROT 6.1*  ALBUMIN 3.3*   No results for input(s): LIPASE, AMYLASE in the last 168 hours. No results for input(s): AMMONIA in the last 168 hours. CBC:  Recent Labs Lab 04/19/16 1507 04/20/16 0711 04/21/16 0310  WBC 7.3 9.2 8.4  NEUTROABS  --  7.4  --   HGB 16.0 15.9 15.3  HCT 46.4 45.8 45.4  MCV 91.7 91.1 91.7  PLT 157 138* 146*   Cardiac Enzymes:  Recent Labs Lab 04/19/16 1507 04/20/16 1002 04/20/16 1510 04/20/16 1807 04/21/16 0004  TROPONINI 0.03* 61.52* >65.00* >65.00* 44.44*   BNP: Invalid input(s): POCBNP CBG: No results for input(s): GLUCAP in the last 168 hours. Microbiology: No results found for: CULT No results for input(s): CULT, SDES in the last 168 hours.  Imaging: No results found. Patient ID:  56 y.o. 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 Underwent PCI RCA on 1/20. EF 45-50%.   ASSESSMENT:  1. Inferior STEMI/CAD --s/p PCI RCA on 1/20. EF 45-50% by cath. Echo pending --Residual  LAD 70% 2. ETOH and tobacco abuse 3. H/o ETOH CM   PLAN/DISCUSSION:  Increase coreg and add ARB. Will need post intervention echo / MRI to risk stratify for AICD Don not think he would wear life vest. Discussed ETOH abuse at length. D/c home outpatient  [place] in [place] where [place] Admitted and seen by DB/MC this admission  Add aldactone  With BMET next week F/U Bailey MechKatherine Lawrence AP next week   D/C meds to include Aldactone 12.5  Coreg 6.25 bid Cozaar 25 mg daily   Charlton HawsPeter Bleu Moisan

## 2016-04-22 NOTE — Progress Notes (Signed)
CARDIAC REHAB PHASE I   PRE:  Rate/Rhythm: 106 ST  BP:  Supine:   Sitting: 125/88  Standing:    SaO2:   MODE:  Ambulation: 510 ft   POST:  Rate/Rhythm: 110 ST  BP:  Supine:   Sitting: 117/83  Standing:    SaO2: 95%RA [id number] Pt walked 510 ft with steady gait and tolerated well. No CP. Case manager seeing pt re needs. Stressed with pt the importance of brilinta and he stated he would take all his meds as this was a wake up call. Gave pt CHF booklet and discussed when to call cardiologist with symptoms. Encouraged watching sodium. Pt stated he has drank 48 beers in 4 hours before and not feel drunk. He stated he knows he is going to have to try to cut down or quit smoking and using alcohol. Pt could recite how to take NTG tablets.   Luetta Nuttingharlene Jahvier Aldea, RN BSN  04/22/2016 10:14 AM

## 2016-04-22 NOTE — Discharge Summary (Signed)
Discharge Summary    Patient ID: Craig Perry,  MRN: [id number], DOB/AGE: 1960/10/16 56 y.o.  Admit date: 04/20/2016 Discharge date: 04/22/2016  Primary Care Provider: No PCP Per Patient Primary Cardiologist: Will follow up in [place]   Discharge Diagnoses    Principal Problem:   ST elevation myocardial infarction (STEMI) of inferior wall, initial episode of care [facility](HCC): Type I MI Active Problems:   Tobacco abuse   ETOH abuse   Alcoholic cardiomyopathy (HCC)   Hypertension   Allergies No Known Allergies   History of Present Illness     56 y.o. 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   He was last seen by cardiology in 2014 for systolic CHF. EF 35% and cath at that time showed non obstructive CAD. It was felt to be alcohol induced CM. He has not been seen in follow up since that time.   [facility] with chest pain. His troponin came back at 0.03 and chest pain resolved so he left without being seen. He then returned later that night/early AM on 04/20/16 with chest pain waking him from sleep. He called EMS and ECG showed inferior ST elevations and a code STEMI was activated.    Hospital Course     Consultants: none  Inferior STEMI: In the cath lab, pt was found to have 100% mRCA occlusion, for which he received DES x 1. There was significant residual disease present in the mLAD.  LV gram showed EF in the 45-50% range with inferior hypokinesis. Troponin >65.  -- Continue ASA/Brilinta, BB and statin  -- [facility]  Ischemic CM: had alcohol induced CM with EF down to 35% in 2014. 2D ECHO this admission with EF 25-30%. Continue Coreg 6.25mg  BID, Losartan 25mg  daily and spiro 12.5mg  daily. Patient would likely not be complaint with a LifeVest.  HTN: started on  Coreg, Losartan and Spiro while admitted. Would check a BMET at follow up.   Tobacco abuse: smoking 4 PPD. Cessation advised.   Significant EtOH abuse history: he says he has cut back significantly. He was placed on CIWA protocol while here. Complete cessation advised.   DISPO: patient assistance paperwork for Brilinta was filled out. [facility] 34 days of very low payment meds. 90 day supplies were called into the [facility]  The patient has had an uncomplicated hospital course and is recovering well. The radial catheter site is stable He has been seen by Dr. Eden EmmsNishan today and deemed ready for discharge home. All follow-up appointments have been scheduled. Smoking cessation was disscussed in length. A written RX for a 30 day free supply of Brilinta was provided for the patient. Discharge medications are listed below.  _____________  Discharge Vitals Blood pressure 112/80, pulse 95, temperature 97.9 F (36.6 C), temperature source Oral, resp. rate 18, height 5\' 11"  (1.803 m), weight 157 lb 4.8 oz (71.4 kg), SpO2 98 %.  Filed Weights   04/20/16 0630 04/21/16 0300 04/22/16 0524  Weight: 161 lb 9.6 oz (73.3 kg) 158 lb 8.2 oz (71.9 kg) 157 lb 4.8 oz (71.4 kg)    Labs & Radiologic Studies     CBC  Recent Labs  04/20/16 0711 04/21/16 0310  WBC 9.2 8.4  NEUTROABS 7.4  --  HGB 15.9 15.3  HCT 45.8 45.4  MCV 91.1 91.7  PLT 138* 146*   Basic Metabolic Panel  Recent Labs  04/20/16 1002 04/21/16 0310  NA 134* 137  K 4.2 4.0  CL 104 107  CO2 23 23  GLUCOSE 242* 118*  BUN 9 11  CREATININE 0.79 0.83  CALCIUM 8.9 9.2   Liver Function Tests  Recent Labs  04/20/16 1002  AST 194*  ALT 26  ALKPHOS 69  BILITOT 0.7  PROT 6.1*  ALBUMIN 3.3*   No results for input(s): LIPASE, AMYLASE in the last 72 hours. Cardiac Enzymes  Recent Labs  04/20/16 1510 04/20/16 1807 04/21/16 0004  TROPONINI >65.00* >65.00* 44.44*   BNP Invalid input(s):  POCBNP D-Dimer No results for input(s): DDIMER in the last 72 hours. Hemoglobin A1C  Recent Labs  04/20/16 0711  HGBA1C 5.9*   Fasting Lipid Panel  Recent Labs  04/21/16 0310  CHOL 174  HDL 35*  LDLCALC 109*  TRIG 148  CHOLHDL 5.0   Thyroid Function Tests No results for input(s): TSH, T4TOTAL, T3FREE, THYROIDAB in the last 72 hours.  Invalid input(s): FREET3  Dg Chest 2 View  Result Date: 04/19/2016 CLINICAL DATA:  Chest pain. EXAM: CHEST  2 VIEW COMPARISON:  07/22/2014. FINDINGS: Mediastinum and hilar structures are normal. Lungs are clear. Symmetric bilateral solitary nodular opacities noted over the lung bases consistent with nipple shadows. Similar findings on prior exam. Biapical pleural thickening noted consistent scarring. No acute bony abnormality . IMPRESSION: No acute cardiopulmonary disease. Electronically Signed   By: Maisie Fus  Register   On: 04/19/2016 15:09     Diagnostic Studies/Procedures    2D ECHO: 04/21/2016 LV EF: 25% -   30% Study Conclusions - Left ventricle: The cavity size was normal. Wall thickness was   normal. Systolic function was severely reduced. The estimated   ejection fraction was in the range of 25% to 30%. Diffuse   hypokinesis. Doppler parameters are consistent with abnormal left   ventricular relaxation (grade 1 diastolic dysfunction). _____________   04/21/15 Coronary Stent Intervention  Left Heart Cath and Coronary Angiography  Conclusion    INFERIOR STEMI WITH Mid RCA lesion, 100 %stenosed.  A STENT SYNERGY DES 2.5X28 (2.8 mm) drug eluting stent was successfully placed.  Post intervention, there is a 0% residual stenosis.  3rd RPLB lesion, 100 %stenosed - distal embolization  ____________________________________  Mid LAD-2 lesion, 70 %stenosed at major Diag 2. Ost 2nd Diag to 2nd Diag lesion, 40 %stenosed.  Lat 1st Mrg lesion, 100 %stenosed.  LV FUNCTION & HEMODYNAMICS  There is mild left ventricular systolic  dysfunction. The left ventricular ejection fraction is 45-50% by visual estimate.  LV end diastolic pressure is moderately elevated.   Severe 2 vessel disease with 100% occlusion of the RCA treated with a DES stent. There is also distal embolization. The distal tip of the posterolateral branch. This will be treated with IV Aggrastat for 6 hours. The other diseased vessel is the mid LAD with up to 70% stenosis at D2.    Plan:  Admit to CCU for TR band removal and post STEMI PCI care   Aggrastat for 6 hours   Dual antiplatelet therapy for minimum of 3 months at which time aspirin could be stopped. - Loaded with Brilinta   Continue carvedilol, add statin   Smoking cessation counseling will be necessary.  Cardiac rehabilitation consult - [facility] for Phase II  Based on his history of extreme alcohol use, would  likely be best to minimize his hospital stay in order to avoid potential withdrawal. The LAD lesion does not look acute.   At this point, my recommendation would be medical management of this lesion to allow for recovery from the current MI. Would then potentially reassess based on symptoms or stress test in a month. This will also allow [place] to ensure that he remains compliant with medications.    Disposition   Pt is being discharged home today in good condition.  Follow-up Plans & Appointments    Follow-up Information    [facility] Follow up on 04/29/2016.   Why:  @ 10:00 am with Colvin Caroli for [facility] Follow-Up Appointment. Please make Colvin Caroli aware that you will need assistance with Brilinta and other medications. You should speak with Meriam Sprague before leaving.  Contact information: [address] [place] [address] [phone]          Discharge Instructions    Amb Referral to Cardiac Rehabilitation    Complete by:  As directed    Patient is not interest in lifestyle changes or phase 2 rehab at time order was placed.   Diagnosis:  Coronary  Stents      Discharge Medications     Medication List    STOP taking these medications   predniSONE 20 MG tablet Commonly known as:  DELTASONE     TAKE these medications   aspirin 81 MG EC tablet Take 1 tablet (81 mg total) by mouth daily. Start taking on:  04/23/2016   atorvastatin 80 MG tablet Commonly known as:  LIPITOR Take 1 tablet (80 mg total) by mouth daily at 6 PM.   carvedilol 6.25 MG tablet Commonly known as:  COREG Take 1 tablet (6.25 mg total) by mouth 2 (two) times daily with a meal. What changed:  medication strength  how much to take   folic acid 1 MG tablet Commonly known as:  FOLVITE Take 1 tablet (1 mg total) by mouth daily. Start taking on:  04/23/2016   losartan 25 MG tablet Commonly known as:  COZAAR Take 1 tablet (25 mg total) by mouth daily. Start taking on:  04/23/2016   nitroGLYCERIN 0.4 MG SL tablet Commonly known as:  NITROSTAT Place 1 tablet (0.4 mg total) under the tongue every 5 (five) minutes x 3 doses as needed for chest pain.   spironolactone 25 MG tablet Commonly known as:  ALDACTONE Take 0.5 tablets (12.5 mg total) by mouth daily. Start taking on:  04/23/2016   thiamine 100 MG tablet Take 1 tablet (100 mg total) by mouth daily. Start taking on:  04/23/2016   ticagrelor 90 MG Tabs tablet Commonly known as:  BRILINTA Take 1 tablet (90 mg total) by mouth 2 (two) times daily.       Aspirin prescribed at discharge?  Yes High Intensity Statin Prescribed? (Lipitor 40-80mg  or Crestor 20-40mg ): Yes Beta Blocker Prescribed? Yes For EF 45% or less, Was ACEI/ARB Prescribed? Yes ADP Receptor Inhibitor Prescribed? (i.e. Plavix etc.-Includes Medically Managed Patients): Yes For EF <45%, Aldosterone Inhibitor Prescribed? Yes Was EF assessed during THIS hospitalization? Yes Was Cardiac Rehab II ordered? (Included Medically managed Patients): Yes   Outstanding Labs/Studies   BMET  Duration of Discharge Encounter   Greater than  30 minutes including physician time.  Signed, Cline Crock PA-C 04/22/2016, 11:30 AM

## 2016-04-22 NOTE — Telephone Encounter (Signed)
[facility].  Patient understands to follow up with provider Harriet PhoK Lawrence NP [facility] 04/29/16 [facility]. Patient understands discharge instructions? yes Patient understands medications and regiment? yes Patient understands to bring all medications to this visit? yes  I spoke with pt and he verbalized understanding of fu apt

## 2016-04-26 ENCOUNTER — Encounter: Payer: Self-pay | Admitting: Adult Health

## 2016-04-29 ENCOUNTER — Encounter: Payer: Self-pay | Admitting: Adult Health

## 2016-04-29 ENCOUNTER — Telehealth: Payer: Self-pay | Admitting: *Deleted

## 2016-04-29 ENCOUNTER — Other Ambulatory Visit (HOSPITAL_COMMUNITY)
Admission: RE | Admit: 2016-04-29 | Discharge: 2016-04-29 | Disposition: A | Discharge status: Home / Self Care | Payer: Self-pay | Source: Ambulatory Visit | Attending: Adult Health | Admitting: Adult Health

## 2016-04-29 ENCOUNTER — Encounter: Payer: Self-pay | Admitting: *Deleted

## 2016-04-29 ENCOUNTER — Ambulatory Visit (INDEPENDENT_AMBULATORY_CARE_PROVIDER_SITE_OTHER): Payer: Self-pay | Admitting: Adult Health

## 2016-04-29 VITALS — BP 118/60 | HR 94 | Ht 71.5 in | Wt 165.0 lb

## 2016-04-29 DIAGNOSIS — I519 Heart disease, unspecified: Secondary | ICD-10-CM

## 2016-04-29 DIAGNOSIS — Z720 Tobacco use: Secondary | ICD-10-CM

## 2016-04-29 DIAGNOSIS — I426 Alcoholic cardiomyopathy: Secondary | ICD-10-CM

## 2016-04-29 DIAGNOSIS — I2510 Atherosclerotic heart disease of native coronary artery without angina pectoris: Secondary | ICD-10-CM

## 2016-04-29 DIAGNOSIS — I10 Essential (primary) hypertension: Secondary | ICD-10-CM | POA: Insufficient documentation

## 2016-04-29 LAB — BASIC METABOLIC PANEL
Anion gap: 6 (ref 5–15)
BUN: 13 mg/dL (ref 6–20)
CHLORIDE: 99 mmol/L — AB (ref 101–111)
CO2: 29 mmol/L (ref 22–32)
Calcium: 9.4 mg/dL (ref 8.9–10.3)
Creatinine, Ser: 0.84 mg/dL (ref 0.61–1.24)
GFR calc Af Amer: >60 mL/min (ref >60)
Glucose, Bld: 107 mg/dL — ABNORMAL HIGH (ref 65–99)
POTASSIUM: 4.1 mmol/L (ref 3.5–5.1)
SODIUM: 134 mmol/L — AB (ref 135–145)

## 2016-04-29 MED ORDER — NITROGLYCERIN 0.4 MG SL SUBL
0.4000 mg | SUBLINGUAL_TABLET | SUBLINGUAL | 12 refills | Status: DC | PRN
Start: 2016-04-29 — End: 2017-07-23

## 2016-04-29 MED ORDER — CARVEDILOL 6.25 MG PO TABS: 6.2500 mg | ORAL_TABLET | Freq: Two times a day (BID) | ORAL | 1 refills | Status: DC

## 2016-04-29 MED ORDER — ATORVASTATIN CALCIUM 80 MG PO TABS: 80.0000 mg | ORAL_TABLET | Freq: Every day | ORAL | 1 refills | Status: DC

## 2016-04-29 MED ORDER — LOSARTAN POTASSIUM 25 MG PO TABS: 25.0000 mg | ORAL_TABLET | Freq: Every day | ORAL | 1 refills | Status: DC

## 2016-04-29 MED ORDER — SPIRONOLACTONE 25 MG PO TABS: 12.5000 mg | ORAL_TABLET | Freq: Every day | ORAL | 1 refills | Status: DC

## 2016-04-29 NOTE — Progress Notes (Signed)
Name: Craig Perry    DOB: 12-Apr-1960  Age: 56 y.o.  MR#: [id number]       PCP:  No PCP Per Patient      Insurance: Payor: MEDICAID POTENTIAL / Plan: MEDICAID POTENTIAL / Product Type: *No Product type* /   CC:   No chief complaint on file.   VS Vitals:   04/29/16 1326  Pulse: 94  SpO2: 99%  Weight: 165 lb (74.8 kg)  Height: 5' 11.5" (1.816 m)    Weights Current Weight  04/29/16 165 lb (74.8 kg)  04/22/16 157 lb 4.8 oz (71.4 kg)  04/19/16 160 lb (72.6 kg)    Blood Pressure  BP Readings from Last 3 Encounters:  04/22/16 112/80  04/19/16 148/97  12/25/14 104/72     Admit date:  (Not on file) Last encounter with RMR:  Visit date not found   Allergy Patient has no known allergies.  Current Outpatient Prescriptions  Medication Sig Dispense Refill  . aspirin EC 81 MG EC tablet Take 1 tablet (81 mg total) by mouth daily.    Marland Kitchen. atorvastatin (LIPITOR) 80 MG tablet Take 1 tablet (80 mg total) by mouth daily at 6 PM. 90 tablet 1  . carvedilol (COREG) 6.25 MG tablet Take 1 tablet (6.25 mg total) by mouth 2 (two) times daily with a meal. 180 tablet 1  . folic acid (FOLVITE) 1 MG tablet Take 1 tablet (1 mg total) by mouth daily. 90 tablet 1  . losartan (COZAAR) 25 MG tablet Take 1 tablet (25 mg total) by mouth daily. 90 tablet 1  . nitroGLYCERIN (NITROSTAT) 0.4 MG SL tablet Place 1 tablet (0.4 mg total) under the tongue every 5 (five) minutes x 3 doses as needed for chest pain. 25 tablet 12  . spironolactone (ALDACTONE) 25 MG tablet Take 0.5 tablets (12.5 mg total) by mouth daily. 90 tablet 1  . thiamine 100 MG tablet Take 1 tablet (100 mg total) by mouth daily. 90 tablet 1  . ticagrelor (BRILINTA) 90 MG TABS tablet Take 1 tablet (90 mg total) by mouth 2 (two) times daily. 180 tablet 4   No current facility-administered medications for this visit.     Discontinued Meds:   There are no discontinued medications.  Patient Active Problem List   Diagnosis Date Noted  .  Hypertension   . ST elevation myocardial infarction (STEMI) of inferior wall, initial episode of care [facility](HCC): Type I MI 04/20/2016  . Alcoholic cardiomyopathy (HCC) 28/41/324412/10/2012  . Tobacco abuse 03/07/2013  . ETOH abuse 03/07/2013  . Unintentional weight loss 03/07/2013    LABS    Component Value Date/Time   NA 137 04/21/2016 0310   NA 134 (L) 04/20/2016 1002   NA 139 04/20/2016 0537   K 4.0 04/21/2016 0310   K 4.2 04/20/2016 1002   K 4.0 04/20/2016 0537   CL 107 04/21/2016 0310   CL 104 04/20/2016 1002   CL 103 04/20/2016 0537   CO2 23 04/21/2016 0310   CO2 23 04/20/2016 1002   CO2 28 04/19/2016 1507   GLUCOSE 118 (H) 04/21/2016 0310   GLUCOSE 242 (H) 04/20/2016 1002   GLUCOSE 141 (H) 04/20/2016 0537   BUN 11 04/21/2016 0310   BUN 9 04/20/2016 1002   BUN 16 04/20/2016 0537   CREATININE 0.83 04/21/2016 0310   CREATININE 0.79 04/20/2016 1002   CREATININE 0.80 04/20/2016 0537   CALCIUM 9.2 04/21/2016 0310   CALCIUM 8.9 04/20/2016 1002   CALCIUM 9.9 04/19/2016 1507  GFRNONAA >60 04/21/2016 0310   GFRNONAA >60 04/20/2016 1002   GFRNONAA >60 04/19/2016 1507   GFRAA >60 04/21/2016 0310   GFRAA >60 04/20/2016 1002   GFRAA >60 04/19/2016 1507   CMP     Component Value Date/Time   NA 137 04/21/2016 0310   K 4.0 04/21/2016 0310   CL 107 04/21/2016 0310   CO2 23 04/21/2016 0310   GLUCOSE 118 (H) 04/21/2016 0310   BUN 11 04/21/2016 0310   CREATININE 0.83 04/21/2016 0310   CALCIUM 9.2 04/21/2016 0310   PROT 6.1 (L) 04/20/2016 1002   ALBUMIN 3.3 (L) 04/20/2016 1002   AST 194 (H) 04/20/2016 1002   ALT 26 04/20/2016 1002   ALKPHOS 69 04/20/2016 1002   BILITOT 0.7 04/20/2016 1002   GFRNONAA >60 04/21/2016 0310   GFRAA >60 04/21/2016 0310       Component Value Date/Time   WBC 8.4 04/21/2016 0310   WBC 9.2 04/20/2016 0711   WBC 7.3 04/19/2016 1507   HGB 15.3 04/21/2016 0310   HGB 15.9 04/20/2016 0711   HGB 15.0 04/20/2016 0537   HCT 45.4 04/21/2016 0310   HCT 45.8  04/20/2016 0711   HCT 44.0 04/20/2016 0537   MCV 91.7 04/21/2016 0310   MCV 91.1 04/20/2016 0711   MCV 91.7 04/19/2016 1507    Lipid Panel     Component Value Date/Time   CHOL 174 04/21/2016 0310   TRIG 148 04/21/2016 0310   HDL 35 (L) 04/21/2016 0310   CHOLHDL 5.0 04/21/2016 0310   VLDL 30 04/21/2016 0310   LDLCALC 109 (H) 04/21/2016 0310    ABG    Component Value Date/Time   PHART 7.230 (L) 04/20/2016 0554   PCO2ART 50.4 (H) 04/20/2016 0554   PO2ART 82.0 (L) 04/20/2016 0554   HCO3 21.1 04/20/2016 0554   TCO2 23 04/20/2016 0554   ACIDBASEDEF 7.0 (H) 04/20/2016 0554   O2SAT 93.0 04/20/2016 0554     No results found for: TSH BNP (last 3 results) No results for input(s): BNP in the last 8760 hours.  ProBNP (last 3 results) No results for input(s): PROBNP in the last 8760 hours.  Cardiac Panel (last 3 results) No results for input(s): CKTOTAL, CKMB, TROPONINI, RELINDX in the last 72 hours.  Iron/TIBC/Ferritin/ %Sat No results found for: IRON, TIBC, FERRITIN, IRONPCTSAT   EKG Orders placed or performed during the hospital encounter of 04/20/16  . EKG 12-Lead  . EKG 12-Lead immediately post procedure  . EKG 12-Lead  . EKG 12-Lead  . EKG 12-Lead immediately post procedure  . EKG 12-Lead  . EKG 12-Lead     Prior Assessment and Plan Problem List as of 04/29/2016 Reviewed: 04/20/2016  7:09 AM by Bryan Lemma, MD     Cardiovascular and Mediastinum   Alcoholic cardiomyopathy (HCC)   ST elevation myocardial infarction (STEMI) of inferior wall, initial episode of care [facility]): Type I MI   Hypertension     Other   Tobacco abuse   ETOH abuse   Unintentional weight loss       Imaging: Dg Chest 2 View  Result Date: 04/19/2016 CLINICAL DATA:  Chest pain. EXAM: CHEST  2 VIEW COMPARISON:  07/22/2014. FINDINGS: Mediastinum and hilar structures are normal. Lungs are clear. Symmetric bilateral solitary nodular opacities noted over the lung bases consistent with nipple  shadows. Similar findings on prior exam. Biapical pleural thickening noted consistent scarring. No acute bony abnormality . IMPRESSION: No acute cardiopulmonary disease. Electronically Signed   By: Maisie Fus  Register   On: 04/19/2016 15:09

## 2016-04-29 NOTE — Addendum Note (Signed)
Addended by: Kerney ElbePINNIX, Mackensey Bolte G on: 04/29/2016 03:07 PM   Modules accepted: Orders

## 2016-04-29 NOTE — Telephone Encounter (Signed)
Called patient with test results. No answer. Left message to call back.  

## 2016-04-29 NOTE — Telephone Encounter (Signed)
-----   Message from Jodelle GrossKathryn M Lawrence, NP sent at 04/29/2016  3:48 PM EST ----- Labs look good. No changes in regimen for now.

## 2016-04-29 NOTE — Patient Instructions (Signed)
Your physician recommends that you schedule a follow-up appointment in: 1 Month   Your physician recommends that you continue on your current medications as directed. Please refer to the Current Medication list given to you today.  Your physician has requested that you have en exercise stress myoview. For further information please visit [email or web address]. Please follow instruction sheet, as given.  Your physician recommends that you return for lab work.  If you need a refill on your cardiac medications before your next appointment, please call your pharmacy.  Thank you for choosing [facility]!

## 2016-04-29 NOTE — Progress Notes (Signed)
Cardiology Office Note   Date:  04/29/2016   ID:  Zev, Blue Jan 20, 1961, MRN [id number]  PCP:  No PCP Per Patient  Cardiologist: To be established/  Joni Reining, NP   No chief complaint on file.     History of Present Illness: Craig Perry is a 56 y.o. male who presents for posthospitalization follow-up, in the setting of ST elevation MI with known history of alcoholic cardiomyopathy ongoing tobacco abuse and hypertension. Presented to [facility] ER in the setting of chest pain on 04/19/2016. He was sent to the cardiac catheterization lab and diagnosed with inferior ST elevation MI with 100% mid RCA occlusion for which she received a drug-eluting stent 1. There was also significant residual disease in the mid LAD. EF showed 4045% per cath. He was started on aspirin, Brilinta, beta blocker, and statin. Echocardiogram during admission revealed reduced EF of 25-30%. He was also started on losartan and spironolactone. Follow-up BMET is ordered this clinic visit.  He states he has stopped drinking completely. Has gone cold [place]. He has been substituting alcohol with coffee, and is drinking 6-7 cups a day. He reportedly continues to smoke but he is working on that as well. He sits a lot to give up but he is working on smoking cessation. He denies any complaints of chest pain dyspnea leading or fatigue. He is on a program to receive his Brilinta free of charge. He is medically compliant.  Past Medical History:  Diagnosis Date  . Alcohol abuse   . CAD (coronary artery disease)    a. 2014 cath with non obst CAD.    b. 04/2016: inferior STEMI s/p DES to RCA. 70% LAD wtih no intervention  . Hypertension   . Myocardial infarction   . Pneumothorax   . Stroke [facility])     Past Surgical History:  Procedure Laterality Date  . ABDOMINAL SURGERY    . CARDIAC CATHETERIZATION N/A 04/20/2016   Procedure: Left Heart Cath and Coronary Angiography;  Surgeon: Marykay Lex, MD;  Location:  [facility] INVASIVE CV LAB;  Service: Cardiovascular;  Laterality: N/A;  . CARDIAC CATHETERIZATION N/A 04/20/2016   Procedure: Coronary Stent Intervention;  Surgeon: Marykay Lex, MD;  Location: [facility] INVASIVE CV LAB;  Service: Cardiovascular;  Laterality: N/A;  . COLON SURGERY    . LEFT HEART CATHETERIZATION WITH CORONARY ANGIOGRAM N/A 03/08/2013   Procedure: LEFT HEART CATHETERIZATION WITH CORONARY ANGIOGRAM;  Surgeon: Micheline Chapman, MD;  Location: [facility] CATH LAB;  Service: Cardiovascular;  Laterality: N/A;  . LIVER SURGERY    . SPLENECTOMY       Current Outpatient Prescriptions  Medication Sig Dispense Refill  . aspirin EC 81 MG EC tablet Take 1 tablet (81 mg total) by mouth daily.    Marland Kitchen atorvastatin (LIPITOR) 80 MG tablet Take 1 tablet (80 mg total) by mouth daily at 6 PM. 90 tablet 1  . carvedilol (COREG) 6.25 MG tablet Take 1 tablet (6.25 mg total) by mouth 2 (two) times daily with a meal. 180 tablet 1  . folic acid (FOLVITE) 1 MG tablet Take 1 tablet (1 mg total) by mouth daily. 90 tablet 1  . losartan (COZAAR) 25 MG tablet Take 1 tablet (25 mg total) by mouth daily. 90 tablet 1  . nitroGLYCERIN (NITROSTAT) 0.4 MG SL tablet Place 1 tablet (0.4 mg total) under the tongue every 5 (five) minutes x 3 doses as needed for chest pain. 25 tablet 12  . spironolactone (ALDACTONE) 25 MG  tablet Take 0.5 tablets (12.5 mg total) by mouth daily. 90 tablet 1  . thiamine 100 MG tablet Take 1 tablet (100 mg total) by mouth daily. 90 tablet 1  . ticagrelor (BRILINTA) 90 MG TABS tablet Take 1 tablet (90 mg total) by mouth 2 (two) times daily. 180 tablet 4   No current facility-administered medications for this visit.     Allergies:   Patient has no known allergies.    Social History:  The patient  reports that he has been smoking Cigarettes.  He has a 180.00 pack-year smoking history. He has never used smokeless tobacco. He reports that he drinks about 7.2 oz of alcohol per week . He reports that he does not  use drugs.   Family History:  The patient's family history includes Diabetes in his father and mother; Heart attack in his father and mother; Stroke in his mother.    ROS: All other systems are reviewed and negative. Unless otherwise mentioned in H&P    PHYSICAL EXAM: VS:  BP 118/60   Pulse 94   Ht 5' 11.5" (1.816 m)   Wt 165 lb (74.8 kg)   SpO2 99%   BMI 22.69 kg/m  , BMI Body mass index is 22.69 kg/m. GEN: Well nourished, well developed, in no acute distress Smells of cigarettes. HEENT: normal  Neck: no JVD, carotid bruits, or masses Cardiac:RRR; tachycardic, no murmurs, rubs, or gallops,no edema  Respiratory:  clear to auscultation bilaterally, normal work of breathing GI: soft, nontender, nondistended, + BS MS: no deformity or atrophy Right wrist catheterization insertion site, a hematoma bleeding or signs of infection Skin: warm and dry, no rash Neuro:  Strength and sensation are intact Psych: euthymic mood, full affect  Recent Labs: 04/20/2016: ALT 26 04/21/2016: BUN 11; Creatinine, Ser 0.83; Hemoglobin 15.3; Platelets 146; Potassium 4.0; Sodium 137    Lipid Panel    Component Value Date/Time   CHOL 174 04/21/2016 0310   TRIG 148 04/21/2016 0310   HDL 35 (L) 04/21/2016 0310   CHOLHDL 5.0 04/21/2016 0310   VLDL 30 04/21/2016 0310   LDLCALC 109 (H) 04/21/2016 0310      Wt Readings from Last 3 Encounters:  04/29/16 165 lb (74.8 kg)  04/22/16 157 lb 4.8 oz (71.4 kg)  04/19/16 160 lb (72.6 kg)      ASSESSMENT AND PLAN:  1.  Coronary artery disease: 100% occluded right coronary artery status post drug-eluting stent reducing it to 0%. He also has a third R PLB lesion at 100% stenosis with distal embolization. Also mid LAD 2 lesion of 70% stenosis. The patient is recommended to be scheduled for a stress test in 30 days. This is going to be completed to evaluate for ischemic burden. In the interim he will continue carvedilol, aspirin, Brilinta, statin therapy, and  losartan.  He has been offered cardiac rehabilitation and currently refuses. He says he wishes to think about it. [place] need [place] He states he will ask his boss, who is also seen in this practice, which cardiologist to choose. He will be connected at that time.  I have gone over his medications and explained how important it is for him to take them everyday especially Brilinta. He verbalizes understanding. I have also given him a printed diagram of his cardiac catheterization, areas of stenosis, and placement of stent. His my hopes that by saying all of his blockages this will continue to motivate him to  stop smoking.  2. Cardiomyopathy: Patient's ejection fraction is 20-25%. He remains on spironolactone and ARB. There is no evidence of fluid retention at this time. Heart rate is currently not well controlled. He states that he drank a large cup of coffee before coming to the office today. I've explained to him that it is important to keep his heart rate control and to decrease his caffeine intake substantially. If necessary we'll have to go up on carvedilol. Currently blood pressure is 112/80. When I see him again we may need to increase this dose if his heart rate remains elevated.  3. Hypercholesterolemia: Remains on statin therapy, low cholesterol diet.  4. Ongoing tobacco abuse: The patient continues to smoke over 4 packs a day. He is working on cutting down. I have counseled him on smoking cessation and the increased risks of worsening stenosis in artery set are not completely blocked. He has been given a diagram along with education using a heart model. He verbalizes understanding.  Current medicines are reviewed at length with the patient today.    Labs/ tests ordered today included No orders of the defined types were placed in this encounter.    Disposition:   FU with one month with BMET and to discuss stress test results. He  will need to be established with cardiologist.  Signed, Joni Reining, NP  04/29/2016 1:46 PM    [facility] [address], [place], [place] [address] Phone: [phone]; Fax: [phone]

## 2016-05-03 ENCOUNTER — Telehealth: Payer: Self-pay | Admitting: Adult Health

## 2016-05-03 DIAGNOSIS — I2510 Atherosclerotic heart disease of native coronary artery without angina pectoris: Secondary | ICD-10-CM

## 2016-05-03 NOTE — Addendum Note (Signed)
Addended by: Marlyn CorporalARLTON, CATHERINE A on: 05/03/2016 04:36 PM   Modules accepted: Orders

## 2016-05-03 NOTE — Telephone Encounter (Signed)
Will forward to provider  

## 2016-05-03 NOTE — Telephone Encounter (Signed)
Per Harriet PhoK Lawrence, pt needs lexiscan myoview, dx: CAD, r/o ischemic burden before she can give him work clearance. I LMTCB-cc Order placed for lexiscan

## 2016-05-03 NOTE — Telephone Encounter (Signed)
.   He is supposed to have stress test before returning to work full time. Once this is completed and resulted, will allow him to return to work. If he has to go back for financial reasons, it should be non-strenuous, and without lifting over 10 lbs.

## 2016-05-03 NOTE — Telephone Encounter (Signed)
Pt would like to know if he can get a return to work note  °

## 2016-05-06 ENCOUNTER — Telehealth: Payer: Self-pay | Admitting: Adult Health

## 2016-05-06 NOTE — Telephone Encounter (Signed)
Apt made for this week,instructions given to patient to be NPO for 6 hrs prior,he can take his meds with a sip of water,no chocolate ,caffeine.He is aware test takes 3-4 hrs

## 2016-05-06 NOTE — Telephone Encounter (Signed)
Pt is scheduled for his stress test, but has questions about what meds he can take the day of

## 2016-05-09 ENCOUNTER — Inpatient Hospital Stay (HOSPITAL_COMMUNITY): Admission: RE | Admit: 2016-05-09 | Payer: Self-pay | Source: Ambulatory Visit

## 2016-05-09 ENCOUNTER — Encounter (HOSPITAL_COMMUNITY): Payer: Self-pay

## 2016-05-09 ENCOUNTER — Encounter (HOSPITAL_COMMUNITY)
Admission: RE | Admit: 2016-05-09 | Discharge: 2016-05-09 | Disposition: A | Discharge status: Home / Self Care | Payer: Self-pay | Source: Ambulatory Visit | Attending: Adult Health | Admitting: Adult Health

## 2016-05-09 DIAGNOSIS — I2510 Atherosclerotic heart disease of native coronary artery without angina pectoris: Secondary | ICD-10-CM | POA: Insufficient documentation

## 2016-05-09 LAB — NM MYOCAR MULTI W/SPECT W/WALL MOTION / EF
CHL CUP MPHR: 163 {beats}/min
CHL CUP NUCLEAR SSS: 19
CHL CUP RESTING HR STRESS: 95 {beats}/min
CHL RATE OF PERCEIVED EXERTION: 15
CSEPED: 5 min
CSEPEDS: 57 s
CSEPHR: 76 %
Estimated workload: 7 METS
LV dias vol: 92 mL (ref 62–150)
LV sys vol: 53 mL
Peak HR: 125 {beats}/min
RATE: 0.34
SDS: 11
SRS: 8
TID: 1.02

## 2016-05-09 MED ORDER — REGADENOSON 0.4 MG/5ML IV SOLN
INTRAVENOUS | Status: AC
  Administered 2016-05-09: 0.4 mg via INTRAVENOUS
  Filled 2016-05-09: qty 5

## 2016-05-09 MED ORDER — TECHNETIUM TC 99M TETROFOSMIN IV KIT
10.0000 | PACK | Freq: Once | INTRAVENOUS | Status: AC | PRN
  Administered 2016-05-09: 10 via INTRAVENOUS

## 2016-05-09 MED ORDER — TECHNETIUM TC 99M TETROFOSMIN IV KIT
30.0000 | PACK | Freq: Once | INTRAVENOUS | Status: AC | PRN
  Administered 2016-05-09: 30.2 via INTRAVENOUS

## 2016-05-09 MED ORDER — SODIUM CHLORIDE 0.9% FLUSH
INTRAVENOUS | Status: AC
  Administered 2016-05-09: 10 mL via INTRAVENOUS
  Filled 2016-05-09: qty 10

## 2016-05-24 ENCOUNTER — Other Ambulatory Visit: Payer: Self-pay | Admitting: Adult Health

## 2016-05-24 ENCOUNTER — Other Ambulatory Visit: Payer: Self-pay | Admitting: *Deleted

## 2016-05-24 MED ORDER — ASPIRIN 81 MG PO TBEC: 81.0000 mg | DELAYED_RELEASE_TABLET | Freq: Every day | ORAL | 6 refills | Status: DC

## 2016-05-24 MED ORDER — TICAGRELOR 90 MG PO TABS: 90.0000 mg | ORAL_TABLET | Freq: Two times a day (BID) | ORAL | 4 refills | Status: DC

## 2016-05-24 NOTE — Telephone Encounter (Signed)
[facility] to [facility] [place].t [email or web address] tg

## 2016-05-24 NOTE — Telephone Encounter (Signed)
Sent escribed refill for ASA

## 2016-05-24 NOTE — Telephone Encounter (Signed)
Refilled as requested  

## 2017-06-16 ENCOUNTER — Observation Stay (HOSPITAL_COMMUNITY)
Admission: EM | Admit: 2017-06-16 | Discharge: 2017-06-17 | Discharge status: Against Medical Advice | Payer: Self-pay | Attending: Internal Medicine | Admitting: Internal Medicine

## 2017-06-16 ENCOUNTER — Encounter (HOSPITAL_COMMUNITY): Payer: Self-pay

## 2017-06-16 ENCOUNTER — Other Ambulatory Visit: Payer: Self-pay

## 2017-06-16 ENCOUNTER — Emergency Department (HOSPITAL_COMMUNITY): Payer: Self-pay

## 2017-06-16 DIAGNOSIS — Z720 Tobacco use: Secondary | ICD-10-CM | POA: Diagnosis present

## 2017-06-16 DIAGNOSIS — I252 Old myocardial infarction: Secondary | ICD-10-CM | POA: Insufficient documentation

## 2017-06-16 DIAGNOSIS — F1010 Alcohol abuse, uncomplicated: Secondary | ICD-10-CM | POA: Diagnosis present

## 2017-06-16 DIAGNOSIS — Z8673 Personal history of transient ischemic attack (TIA), and cerebral infarction without residual deficits: Secondary | ICD-10-CM | POA: Insufficient documentation

## 2017-06-16 DIAGNOSIS — I426 Alcoholic cardiomyopathy: Secondary | ICD-10-CM | POA: Insufficient documentation

## 2017-06-16 DIAGNOSIS — I110 Hypertensive heart disease with heart failure: Secondary | ICD-10-CM | POA: Insufficient documentation

## 2017-06-16 DIAGNOSIS — R0789 Other chest pain: Principal | ICD-10-CM | POA: Insufficient documentation

## 2017-06-16 DIAGNOSIS — Z9119 Patient's noncompliance with other medical treatment and regimen: Secondary | ICD-10-CM | POA: Insufficient documentation

## 2017-06-16 DIAGNOSIS — R079 Chest pain, unspecified: Secondary | ICD-10-CM

## 2017-06-16 DIAGNOSIS — I208 Other forms of angina pectoris: Secondary | ICD-10-CM

## 2017-06-16 DIAGNOSIS — F17210 Nicotine dependence, cigarettes, uncomplicated: Secondary | ICD-10-CM | POA: Insufficient documentation

## 2017-06-16 DIAGNOSIS — I10 Essential (primary) hypertension: Secondary | ICD-10-CM | POA: Diagnosis present

## 2017-06-16 DIAGNOSIS — R000 Tachycardia, unspecified: Secondary | ICD-10-CM

## 2017-06-16 DIAGNOSIS — I25118 Atherosclerotic heart disease of native coronary artery with other forms of angina pectoris: Secondary | ICD-10-CM

## 2017-06-16 DIAGNOSIS — I5043 Acute on chronic combined systolic (congestive) and diastolic (congestive) heart failure: Secondary | ICD-10-CM

## 2017-06-16 DIAGNOSIS — Z9114 Patient's other noncompliance with medication regimen: Secondary | ICD-10-CM | POA: Insufficient documentation

## 2017-06-16 DIAGNOSIS — Z955 Presence of coronary angioplasty implant and graft: Secondary | ICD-10-CM | POA: Insufficient documentation

## 2017-06-16 DIAGNOSIS — I2510 Atherosclerotic heart disease of native coronary artery without angina pectoris: Secondary | ICD-10-CM

## 2017-06-16 LAB — MRSA PCR SCREENING: MRSA BY PCR: NEGATIVE

## 2017-06-16 LAB — TROPONIN I: Troponin I: <0.03 ng/mL (ref <0.03)

## 2017-06-16 LAB — CBC WITH DIFFERENTIAL/PLATELET
BASOS PCT: 0 %
Basophils Absolute: 0 10*3/uL (ref 0.0–0.1)
Eosinophils Absolute: 0.1 10*3/uL (ref 0.0–0.7)
Eosinophils Relative: 1 %
HEMATOCRIT: 47.8 % (ref 39.0–52.0)
Hemoglobin: 15.6 g/dL (ref 13.0–17.0)
Lymphocytes Relative: 18 %
Lymphs Abs: 1.4 10*3/uL (ref 0.7–4.0)
MCH: 28.5 pg (ref 26.0–34.0)
MCHC: 32.6 g/dL (ref 30.0–36.0)
MCV: 87.4 fL (ref 78.0–100.0)
MONO ABS: 0.7 10*3/uL (ref 0.1–1.0)
MONOS PCT: 9 %
Neutro Abs: 5.6 10*3/uL (ref 1.7–7.7)
Neutrophils Relative %: 72 %
Platelets: 197 10*3/uL (ref 150–400)
RBC: 5.47 MIL/uL (ref 4.22–5.81)
RDW: 15.2 % (ref 11.5–15.5)
WBC: 7.7 10*3/uL (ref 4.0–10.5)

## 2017-06-16 LAB — COMPREHENSIVE METABOLIC PANEL
ALBUMIN: 4.2 g/dL (ref 3.5–5.0)
ALT: 13 U/L — ABNORMAL LOW (ref 17–63)
ANION GAP: 15 (ref 5–15)
AST: 25 U/L (ref 15–41)
Alkaline Phosphatase: 84 U/L (ref 38–126)
BILIRUBIN TOTAL: 0.8 mg/dL (ref 0.3–1.2)
BUN: 14 mg/dL (ref 6–20)
CO2: 22 mmol/L (ref 22–32)
Calcium: 9.6 mg/dL (ref 8.9–10.3)
Chloride: 99 mmol/L — ABNORMAL LOW (ref 101–111)
Creatinine, Ser: 1.02 mg/dL (ref 0.61–1.24)
Glucose, Bld: 111 mg/dL — ABNORMAL HIGH (ref 65–99)
Potassium: 4.2 mmol/L (ref 3.5–5.1)
Sodium: 136 mmol/L (ref 135–145)
TOTAL PROTEIN: 7.9 g/dL (ref 6.5–8.1)

## 2017-06-16 LAB — TSH: TSH: 1.022 u[IU]/mL (ref 0.350–4.500)

## 2017-06-16 LAB — LIPASE, BLOOD: LIPASE: 20 U/L (ref 11–51)

## 2017-06-16 LAB — LIPID PANEL
CHOLESTEROL: 182 mg/dL (ref 0–200)
HDL: 69 mg/dL (ref >40)
LDL Cholesterol: 99 mg/dL (ref 0–99)
Total CHOL/HDL Ratio: 2.6 RATIO
Triglycerides: 69 mg/dL (ref <150)
VLDL: 14 mg/dL (ref 0–40)

## 2017-06-16 LAB — ETHANOL

## 2017-06-16 MED ORDER — ONDANSETRON HCL 4 MG PO TABS: 4.0000 mg | ORAL_TABLET | Freq: Four times a day (QID) | ORAL | Status: DC | PRN

## 2017-06-16 MED ORDER — LORAZEPAM 1 MG PO TABS
0.0000 mg | ORAL_TABLET | Freq: Two times a day (BID) | ORAL | Status: DC
Start: 2017-06-18 — End: 2017-06-16

## 2017-06-16 MED ORDER — THIAMINE HCL 100 MG/ML IJ SOLN: 100.0000 mg | Freq: Every day | INTRAMUSCULAR | Status: DC

## 2017-06-16 MED ORDER — ONDANSETRON HCL 4 MG/2ML IJ SOLN: 4.0000 mg | Freq: Four times a day (QID) | INTRAMUSCULAR | Status: DC | PRN

## 2017-06-16 MED ORDER — LORAZEPAM 2 MG/ML IJ SOLN
1.0000 mg | Freq: Once | INTRAMUSCULAR | Status: AC
  Administered 2017-06-16: 1 mg via INTRAVENOUS
  Filled 2017-06-16: qty 1

## 2017-06-16 MED ORDER — SODIUM CHLORIDE 0.9% FLUSH: 3.0000 mL | INTRAVENOUS | Status: DC | PRN

## 2017-06-16 MED ORDER — LORAZEPAM 2 MG/ML IJ SOLN
0.0000 mg | Freq: Four times a day (QID) | INTRAMUSCULAR | Status: DC
Start: 2017-06-16 — End: 2017-06-17
  Administered 2017-06-17 (×2): 2 mg via INTRAVENOUS
  Filled 2017-06-16 (×2): qty 1

## 2017-06-16 MED ORDER — SODIUM CHLORIDE 0.9 % IV SOLN: 250.0000 mL | INTRAVENOUS | Status: DC | PRN

## 2017-06-16 MED ORDER — ASPIRIN 81 MG PO CHEW
324.0000 mg | CHEWABLE_TABLET | Freq: Once | ORAL | Status: AC
  Administered 2017-06-16: 324 mg via ORAL
  Filled 2017-06-16: qty 4

## 2017-06-16 MED ORDER — ACETAMINOPHEN 650 MG RE SUPP: 650.0000 mg | Freq: Four times a day (QID) | RECTAL | Status: DC | PRN

## 2017-06-16 MED ORDER — METOPROLOL TARTRATE 5 MG/5ML IV SOLN
5.0000 mg | Freq: Once | INTRAVENOUS | Status: AC
  Administered 2017-06-16: 5 mg via INTRAVENOUS
  Filled 2017-06-16: qty 5

## 2017-06-16 MED ORDER — VITAMIN B-1 100 MG PO TABS
100.0000 mg | ORAL_TABLET | Freq: Every day | ORAL | Status: DC
  Administered 2017-06-16 – 2017-06-17 (×2): 100 mg via ORAL
  Filled 2017-06-16 (×2): qty 1

## 2017-06-16 MED ORDER — LORAZEPAM 2 MG/ML IJ SOLN
0.0000 mg | Freq: Two times a day (BID) | INTRAMUSCULAR | Status: DC
Start: 2017-06-18 — End: 2017-06-16

## 2017-06-16 MED ORDER — LORAZEPAM 1 MG PO TABS
0.0000 mg | ORAL_TABLET | Freq: Four times a day (QID) | ORAL | Status: DC
Start: 2017-06-16 — End: 2017-06-17
  Administered 2017-06-16: 2 mg via ORAL
  Filled 2017-06-16: qty 2

## 2017-06-16 MED ORDER — MORPHINE SULFATE (PF) 2 MG/ML IV SOLN: 1.0000 mg | INTRAVENOUS | Status: DC | PRN

## 2017-06-16 MED ORDER — ATORVASTATIN CALCIUM 40 MG PO TABS
80.0000 mg | ORAL_TABLET | Freq: Every day | ORAL | Status: DC
  Administered 2017-06-16: 80 mg via ORAL
  Filled 2017-06-16 (×2): qty 2

## 2017-06-16 MED ORDER — LOSARTAN POTASSIUM 50 MG PO TABS
25.0000 mg | ORAL_TABLET | Freq: Every day | ORAL | Status: DC
  Administered 2017-06-16 – 2017-06-17 (×2): 25 mg via ORAL
  Filled 2017-06-16 (×3): qty 1

## 2017-06-16 MED ORDER — CARVEDILOL 3.125 MG PO TABS
6.2500 mg | ORAL_TABLET | Freq: Two times a day (BID) | ORAL | Status: DC
  Administered 2017-06-16 – 2017-06-17 (×2): 6.25 mg via ORAL
  Filled 2017-06-16: qty 1
  Filled 2017-06-16: qty 2

## 2017-06-16 MED ORDER — ASPIRIN EC 81 MG PO TBEC
81.0000 mg | DELAYED_RELEASE_TABLET | Freq: Every day | ORAL | Status: DC
  Administered 2017-06-17: 81 mg via ORAL
  Filled 2017-06-16: qty 1

## 2017-06-16 MED ORDER — SODIUM CHLORIDE 0.9% FLUSH
3.0000 mL | Freq: Two times a day (BID) | INTRAVENOUS | Status: DC
  Administered 2017-06-16: 3 mL via INTRAVENOUS
  Administered 2017-06-16: 15:00:00 via INTRAVENOUS
  Administered 2017-06-17: 3 mL via INTRAVENOUS

## 2017-06-16 MED ORDER — ACETAMINOPHEN 325 MG PO TABS: 650.0000 mg | ORAL_TABLET | Freq: Four times a day (QID) | ORAL | Status: DC | PRN

## 2017-06-16 MED ORDER — ENOXAPARIN SODIUM 40 MG/0.4ML ~~LOC~~ SOLN
40.0000 mg | SUBCUTANEOUS | Status: DC
  Administered 2017-06-16: 40 mg via SUBCUTANEOUS
  Filled 2017-06-16: qty 0.4

## 2017-06-16 NOTE — ED Notes (Signed)
Report given to ICU RN at this time, pt can go up after shift change per RN.

## 2017-06-16 NOTE — ED Notes (Signed)
Called [facility] to confirm the Losartan tab 25mg  is the same as pyxis losartan potassium 25mg .

## 2017-06-16 NOTE — ED Notes (Signed)
Pt unable to provide urine specimen at this time

## 2017-06-16 NOTE — Consult Note (Addendum)
Cardiology Consultation:   Patient ID: YUTO CAJUSTE; [id number]; 1960-04-22   Admit date: 06/16/2017 Date of Consult: 06/16/2017  Primary Care Provider: Patient, No Pcp Per Primary Cardiologist: New to be established in [place] Primary Electrophysiologist: N/A   Patient Profile:   LANDIN TALLON is a 57 y.o. male with a hx of alcoholic cardiomyopathy, CAD  who is being seen today for the evaluation of chest pain at the request of Dr. Sherryll Burger.  History of Present Illness:   Mr. Dalzell with history of alcoholic cardiomyopathy EF 35% in 2014, 4 pack/day smoker, hypertension who was admitted to [facility] with STEMI 04/20/16 treated with DES to the RCA x1.  There was significant residual disease in the mid LAD and LVEF 45-50% with inferior hypokinesis.  2D echo that admission LVEF 25-30%.  Patient was placed on Brilinta and aspirin.  He was seen in follow-up once but has not returned.  He was supposed to have a stress test to evaluate ischemic burden from the LAD but never returned.  Patient came to the emergency room today with chest pain radiating down his left arm.  Saturday night after taking Viagra he had bilateral arm numbness and tingling.  He had some tingling in his arms off and on yesterday but was partying with his friend and drank 18 beers.  He says he has been drinking less approximately 5-6/day.  Today when he awakened he had chest tightness and bilateral arm pain.  He was at work, working on an Scientist, research (life sciences).  Still has some left arm tingling but chest pain is gone.  He quit his Brilinta and all his meds after 6 months because he said he could not afford it.  He continues to smoke 3 packs/day.  EKG showed sinus tachycardia at 125 bpm otherwise no acute change.  Initial troponin negative.  Past Medical History:  Diagnosis Date  . Alcohol abuse   . CAD (coronary artery disease)    a. 2014 cath with non obst CAD.    b. 04/2016: inferior STEMI s/p DES to RCA. 70% LAD wtih no  intervention  . Hypertension   . Myocardial infarction (HCC)   . Pneumothorax   . Stroke [facility])     Past Surgical History:  Procedure Laterality Date  . ABDOMINAL SURGERY    . CARDIAC CATHETERIZATION N/A 04/20/2016   Procedure: Left Heart Cath and Coronary Angiography;  Surgeon: Marykay Lex, MD;  Location: [facility] INVASIVE CV LAB;  Service: Cardiovascular;  Laterality: N/A;  . CARDIAC CATHETERIZATION N/A 04/20/2016   Procedure: Coronary Stent Intervention;  Surgeon: Marykay Lex, MD;  Location: [facility] INVASIVE CV LAB;  Service: Cardiovascular;  Laterality: N/A;  . COLON SURGERY    . LEFT HEART CATHETERIZATION WITH CORONARY ANGIOGRAM N/A 03/08/2013   Procedure: LEFT HEART CATHETERIZATION WITH CORONARY ANGIOGRAM;  Surgeon: Micheline Chapman, MD;  Location: [facility] CATH LAB;  Service: Cardiovascular;  Laterality: N/A;  . LIVER SURGERY    . SPLENECTOMY       Home Medications:  Prior to Admission medications   Medication Sig Start Date End Date Taking? Authorizing Provider  aspirin 81 MG EC tablet Take 1 tablet (81 mg total) by mouth daily. 05/24/16   Jodelle Gross, NP  nitroGLYCERIN (NITROSTAT) 0.4 MG SL tablet Place 1 tablet (0.4 mg total) under the tongue every 5 (five) minutes x 3 doses as needed for chest pain. 04/29/16   Jodelle Gross, NP    Inpatient Medications: Scheduled Meds: . aspirin  81 mg Oral Daily  . atorvastatin  80 mg Oral q1800  . carvedilol  6.25 mg Oral BID WC  . enoxaparin (LOVENOX) injection  40 mg Subcutaneous Q24H  . LORazepam  0-4 mg Intravenous Q6H   Or  . LORazepam  0-4 mg Oral Q6H  . metoprolol tartrate  5 mg Intravenous Once  . sodium chloride flush  3 mL Intravenous Q12H  . thiamine  100 mg Oral Daily   Or  . thiamine  100 mg Intravenous Daily   Continuous Infusions: . sodium chloride     PRN Meds: sodium chloride, acetaminophen **OR** acetaminophen, morphine injection, ondansetron **OR** ondansetron (ZOFRAN) IV, sodium chloride  flush  Allergies:   No Known Allergies  Social History:   Social History   Socioeconomic History  . Marital status: Single    Spouse name: Not on file  . Number of children: Not on file  . Years of education: Not on file  . Highest education level: Not on file  Social Needs  . Financial resource strain: Not on file  . Food insecurity - worry: Not on file  . Food insecurity - inability: Not on file  . Transportation needs - medical: Not on file  . Transportation needs - non-medical: Not on file  Occupational History  . Not on file  Tobacco Use  . Smoking status: Current Every Day Smoker    Packs/day: 4.00    Years: 45.00    Pack years: 180.00    Types: Cigarettes  . Smokeless tobacco: Never Used  Substance and Sexual Activity  . Alcohol use: Yes    Alcohol/week: 7.2 oz    Types: 12 Cans of beer per week    Comment: daily 18 beers  . Drug use: No  . Sexual activity: Not on file  Other Topics Concern  . Not on file  Social History Narrative  . Not on file    Family History:    Family History  Problem Relation Age of Onset  . Heart attack Mother   . Diabetes Mother   . Stroke Mother   . Heart attack Father   . Diabetes Father      ROS:  Please see the history of present illness.  Review of Systems  Constitution: Negative.  HENT: Negative.   Cardiovascular: Positive for chest pain and dyspnea on exertion.  Respiratory: Positive for cough.   Endocrine: Negative.   Hematologic/Lymphatic: Negative.   Musculoskeletal: Negative.   Gastrointestinal: Negative.   Genitourinary: Negative.   Neurological: Negative.     All other ROS reviewed and negative.     Physical Exam/Data:   Vitals:   06/16/17 1330 06/16/17 1339 06/16/17 1400 06/16/17 1430  BP: 125/90 125/90 (!) 148/84 (!) 141/97  Pulse: (!) 108 (!) 111 (!) 111 (!) 103  Resp: 19  14 (!) 25  Temp:      TempSrc:      SpO2: 94%  98% 95%  Weight:      Height:       No intake or output data in the 24  hours ending 06/16/17 1435 Filed Weights   06/16/17 1100  Weight: 157 lb (71.2 kg)   Body mass index is 21.9 kg/m.  General: Dishevelled 57 yo, in no acute distress   HEENT: normal  Heavy beard   Lymph: no adenopathy Neck: no JVD Endocrine:  No thryomegaly Vascular: No carotid bruits; FA pulses 2+ bilaterally without bruits  Cardiac:  normal S1, S2; RRR; positive S4  Lungs: Decreased breath sounds without rales or rhonchi Abd: soft, nontender, no hepatomegaly  Ext: no edema Musculoskeletal:  No deformities, BUE and BLE strength normal and equal Skin: warm and dry  Neuro:  CNs 2-12 intact, no focal abnormalities noted Psych:  Normal affect   EKG:  The EKG was personally reviewed and demonstrates: Sinus tachycardia with nonspecific ST-T wave changes, no acute change Telemetry:  Telemetry was personally reviewed and demonstrates: Normal sinus rhythm to sinus tachycardia at 120 bpm  Relevant CV Studies: 2D echo 1/21/18Study Conclusions   - Left ventricle: The cavity size was normal. Wall thickness was   normal. Systolic function was severely reduced. The estimated   ejection fraction was in the range of 25% to 30%. Diffuse   hypokinesis. Doppler parameters are consistent with abnormal left   ventricular relaxation (grade 1 diastolic dysfunction).   Cardiac cath 1/20/18Conclusion      INFERIOR STEMI WITH Mid RCA lesion, 100 %stenosed.  A STENT SYNERGY DES 2.5X28 (2.8 mm) drug eluting stent was successfully placed.  Post intervention, there is a 0% residual stenosis.  3rd RPLB lesion, 100 %stenosed - distal embolization  ____________________________________  Mid LAD-2 lesion, 70 %stenosed at major Diag 2. Ost 2nd Diag to 2nd Diag lesion, 40 %stenosed.  Lat 1st Mrg lesion, 100 %stenosed.  LV FUNCTION & HEMODYNAMICS  There is mild left ventricular systolic dysfunction. The left ventricular ejection fraction is 45-50% by visual estimate.  LV end diastolic pressure is  moderately elevated.   Severe 2 vessel disease with 100% occlusion of the RCA treated with a DES stent. There is also distal embolization. The distal tip of the posterolateral branch. This will be treated with IV Aggrastat for 6 hours. The other diseased vessel is the mid LAD with up to 70% stenosis at D2.     Plan:  Admit to CCU for TR band removal and post STEMI PCI care   Aggrastat for 6 hours   Dual antiplatelet therapy for minimum of 3 months at which time aspirin could be stopped. - Loaded with Brilinta   Continue carvedilol, add statin   Smoking cessation counseling will be necessary.  Cardiac rehabilitation consult - [facility] for Phase II   Based on his history of extreme alcohol use, would likely be best to minimize his hospital stay in order to avoid potential withdrawal. The LAD lesion does not look acute.   At this point, my recommendation would be medical management of this lesion to allow for recovery from the current MI. Would then potentially reassess based on symptoms or stress test in a month. This will also allow [place] to ensure that he remains compliant with medications.          Laboratory Data:  Chemistry Recent Labs  Lab 06/16/17 1123  NA 136  K 4.2  CL 99*  CO2 22  GLUCOSE 111*  BUN 14  CREATININE 1.02  CALCIUM 9.6  GFRNONAA >60  GFRAA >60  ANIONGAP 15    Recent Labs  Lab 06/16/17 1123  PROT 7.9  ALBUMIN 4.2  AST 25  ALT 13*  ALKPHOS 84  BILITOT 0.8   Hematology Recent Labs  Lab 06/16/17 1123  WBC 7.7  RBC 5.47  HGB 15.6  HCT 47.8  MCV 87.4  MCH 28.5  MCHC 32.6  RDW 15.2  PLT 197   Cardiac Enzymes Recent Labs  Lab 06/16/17 1123  TROPONINI <0.03   No results for input(s): TROPIPOC in the last 168 hours.  BNPNo results for input(s): BNP, PROBNP in the last 168 hours.  DDimer No results for input(s): DDIMER in the last 168 hours.  Radiology/Studies:  Dg Chest Port 1 View  Result Date: 06/16/2017 CLINICAL DATA:  Patient  with chest pain.  Left arm numbness. EXAM: PORTABLE CHEST 1 VIEW COMPARISON:  Chest radiograph 04/19/2016 FINDINGS: Monitoring leads overlie the patient. Normal cardiac and mediastinal contours. Minimal heterogeneous opacities lung bases bilaterally. No pleural effusion or pneumothorax. IMPRESSION: Bibasilar atelectasis. Electronically Signed   By: Annia Belt M.D.   On: 06/16/2017 11:42    Assessment and Plan:   1. Chest pain worrisome for ischemia but troponins negative, EKG without acute change.  Continue to cycle troponins and get back on good meds.  Long history of noncompliance.  Was on Coreg 6.25 mg twice daily, Cozaar 25 mg daily, Aldactone 12.5 mg daily and Brilinta last year Coreg restarted, will restart Cozaar as well. 2. CAD status post STEMI treated with DES to the RCA 04/20/16 with residual LAD disease was supposed to have stress test but never did.  LVEF 25-30% follow-up echo.  Patient stopped his Brilinta and all meds after 6 months 3. Alcoholic cardiomyopathy ejection fraction 35% in 2014 4. Tobacco abuse 3 pack/day 5. EtOH drank 18 beers yesterday but usually drinks 5 or 6 daily 6. Hypertension resume Coreg and Cozaar   For questions or updates, please contact [facility] Please consult [email or web address] for contact info under Cardiology/STEMI.   Signed, Jacolyn Reedy, PA-C  06/16/2017 2:35 PM  Pt seen and examined    I agree with findings as noted by Leda Gauze above    Pt is a 58 yo with known severe CAD, systolic CHF    Presents after weekend of drinking with CP    He says he has run out of meds   Cannot afford  But continues to smoke and drink  On exam, pt is currently pain free.   Neck JVP normal   LUngs show decreased airflow   No rales.  Cardiac exam:   RRR  No S3  No signif murmur  Abd is benign Ext are without edema  EKG is without acute changes   Agree with plans to admit pt   Continue r/o for MI Would start meds for CHF  Counselled on tobacco and alcohol  cessation.    WIll continue to follow.  Dietrich Pates 06/16/2017

## 2017-06-16 NOTE — H&P (Addendum)
History and Physical    Craig Perry:[id number] DOB: [birth year] DOA: 06/16/2017  PCP: Patient, No Pcp Per   Patient coming from: Home  Chief Complaint: Chest pain  HPI: Craig Perry is a 57 y.o. male with medical history significant for alcoholic cardiomyopathy secondary to significant alcohol abuse, ongoing tobacco abuse, hypertension, and severe two-vessel CAD with inferior STEMI and placement of drug-eluting stent to RCA in 04/2016.  He states that he completed a six-month course of antiplatelet agents, but soon thereafter was not able to afford any of his medications and has not taken any of them since then.  He continues to smoke heavily approximately 3 packs/day and continues to drink beer on a semi-daily basis and did drink quite significantly yesterday.  He states that he began having substernal chest pain with radiation down his left arm that began on Saturday evening after he took some Viagra.  He states that the pain is similar to when he had his prior MI, but just not as intense.  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  He grew very concerned and had an episode of diaphoresis, but no nausea or vomiting or shortness of breath. He denies any lower extremity edema, palpitations, lightheadedness, or dizziness. States that he does have some exertional chest pain.   ED Course: Vital signs have remained stable except that he is tachycardic.  This is demonstrated on EKG with sinus tachycardia heart rate of 125 bpm with no findings of ischemic changes.  Chest x-ray demonstrates bibasilar atelectasis with no other acute findings.  Troponin is not elevated and his lab work is otherwise unremarkable.  He has been chest pain-free ever since coming to the emergency department, but is quite certain that he would have chest pain with exertion.  In full dose aspirin in the ED.  ED physician has communicated with cardiologist Dr. Tenny Craw  who recommends trending troponin for now with possible inpatient cardiology evaluation.  Review of Systems: All others reviewed and otherwise negative.  Past Medical History:  Diagnosis Date  . Alcohol abuse   . CAD (coronary artery disease)    a. 2014 cath with non obst CAD.    b. 04/2016: inferior STEMI s/p DES to RCA. 70% LAD wtih no intervention  . Hypertension   . Myocardial infarction (HCC)   . Pneumothorax   . Stroke [facility])     Past Surgical History:  Procedure Laterality Date  . ABDOMINAL SURGERY    . CARDIAC CATHETERIZATION N/A 04/20/2016   Procedure: Left Heart Cath and Coronary Angiography;  Surgeon: Marykay Lex, MD;  Location: [facility] INVASIVE CV LAB;  Service: Cardiovascular;  Laterality: N/A;  . CARDIAC CATHETERIZATION N/A 04/20/2016   Procedure: Coronary Stent Intervention;  Surgeon: Marykay Lex, MD;  Location: [facility] INVASIVE CV LAB;  Service: Cardiovascular;  Laterality: N/A;  . COLON SURGERY    . LEFT HEART CATHETERIZATION WITH CORONARY ANGIOGRAM N/A 03/08/2013   Procedure: LEFT HEART CATHETERIZATION WITH CORONARY ANGIOGRAM;  Surgeon: Micheline Chapman, MD;  Location: [facility] CATH LAB;  Service: Cardiovascular;  Laterality: N/A;  . LIVER SURGERY    . SPLENECTOMY       reports that he has been smoking cigarettes.  He has a 180.00 pack-year smoking history. he has never used smokeless tobacco. He reports that he drinks about 7.2 oz of alcohol per week. He reports that he does not use drugs.  No Known Allergies  Family History  Problem Relation Age of Onset  . Heart attack Mother   . Diabetes Mother   . Stroke Mother   . Heart attack Father   . Diabetes Father     Prior to Admission medications   Medication Sig Start Date End Date Taking? Authorizing Provider  aspirin 81 MG EC tablet Take 1 tablet (81 mg total) by mouth daily. 05/24/16   Jodelle Gross, NP  nitroGLYCERIN (NITROSTAT) 0.4 MG SL tablet Place 1 tablet (0.4 mg total) under the tongue every 5 (five)  minutes x 3 doses as needed for chest pain. 04/29/16   Jodelle Gross, NP    Physical Exam: Vitals:   06/16/17 1100 06/16/17 1121 06/16/17 1230 06/16/17 1330  BP: (!) 143/99 (!) 160/95 (!) 133/94 125/90  Pulse: (!) 138 (!) 120 (!) 107 (!) 108  Resp: 19 17 (!) 23 19  Temp: 98.3 F (36.8 C)     TempSrc: Oral     SpO2: 98% 95% 94% 94%  Weight: 71.2 kg (157 lb)     Height: 5\' 11"  (1.803 m)       Constitutional: NAD, calm, comfortable Vitals:   06/16/17 1100 06/16/17 1121 06/16/17 1230 06/16/17 1330  BP: (!) 143/99 (!) 160/95 (!) 133/94 125/90  Pulse: (!) 138 (!) 120 (!) 107 (!) 108  Resp: 19 17 (!) 23 19  Temp: 98.3 F (36.8 C)     TempSrc: Oral     SpO2: 98% 95% 94% 94%  Weight: 71.2 kg (157 lb)     Height: 5\' 11"  (1.803 m)      Eyes: lids and conjunctivae normal ENMT: Mucous membranes are moist.  Neck: normal, supple Respiratory: clear to auscultation bilaterally. Normal respiratory effort. No accessory muscle use.  Cardiovascular: Regular rate and rhythm-tachycardic, no murmurs. No extremity edema. Abdomen: no tenderness, no distention. Bowel sounds positive.  Musculoskeletal:  No joint deformity upper and lower extremities.   Skin: no rashes, lesions, ulcers.  Psychiatric: Normal judgment and insight. Alert and oriented x 3. Normal mood.   Labs on Admission: I have personally reviewed following labs and imaging studies  CBC: Recent Labs  Lab 06/16/17 1123  WBC 7.7  NEUTROABS 5.6  HGB 15.6  HCT 47.8  MCV 87.4  PLT 197   Basic Metabolic Panel: Recent Labs  Lab 06/16/17 1123  NA 136  K 4.2  CL 99*  CO2 22  GLUCOSE 111*  BUN 14  CREATININE 1.02  CALCIUM 9.6   GFR: Estimated Creatinine Clearance: 79.5 mL/min (by C-G formula based on SCr of 1.02 mg/dL). Liver Function Tests: Recent Labs  Lab 06/16/17 1123  AST 25  ALT 13*  ALKPHOS 84  BILITOT 0.8  PROT 7.9  ALBUMIN 4.2   Recent Labs  Lab 06/16/17 1123  LIPASE 20   No results for  input(s): AMMONIA in the last 168 hours. Coagulation Profile: No results for input(s): INR, PROTIME in the last 168 hours. Cardiac Enzymes: Recent Labs  Lab 06/16/17 1123  TROPONINI <0.03   BNP (last 3 results) No results for input(s): PROBNP in the last 8760 hours. HbA1C: No results for input(s): HGBA1C in the last 72 hours. CBG: No results for input(s): GLUCAP in the last 168 hours. Lipid Profile: No results for input(s): CHOL, HDL, LDLCALC, TRIG, CHOLHDL, LDLDIRECT in the last 72 hours. Thyroid Function Tests: No results for input(s): TSH, T4TOTAL, FREET4, T3FREE, THYROIDAB in the last 72 hours. Anemia Panel: No results for input(s): VITAMINB12, FOLATE, FERRITIN,  TIBC, IRON, RETICCTPCT in the last 72 hours. Urine analysis:    Component Value Date/Time   COLORURINE YELLOW 04/10/2013 0015   APPEARANCEUR CLEAR 04/10/2013 0015   LABSPEC <1.005 (L) 04/10/2013 0015   PHURINE 5.5 04/10/2013 0015   GLUCOSEU NEGATIVE 04/10/2013 0015   HGBUR NEGATIVE 04/10/2013 0015   BILIRUBINUR NEGATIVE 04/10/2013 0015   KETONESUR NEGATIVE 04/10/2013 0015   PROTEINUR NEGATIVE 04/10/2013 0015   UROBILINOGEN 0.2 04/10/2013 0015   NITRITE NEGATIVE 04/10/2013 0015   LEUKOCYTESUR NEGATIVE 04/10/2013 0015    Radiological Exams on Admission: Dg Chest Port 1 View  Result Date: 06/16/2017 CLINICAL DATA:  Patient with chest pain.  Left arm numbness. EXAM: PORTABLE CHEST 1 VIEW COMPARISON:  Chest radiograph 04/19/2016 FINDINGS: Monitoring leads overlie the patient. Normal cardiac and mediastinal contours. Minimal heterogeneous opacities lung bases bilaterally. No pleural effusion or pneumothorax. IMPRESSION: Bibasilar atelectasis. Electronically Signed   By: Annia Beltrew  Davis M.D.   On: 06/16/2017 11:42    EKG: Independently reviewed. ST; HR 125bpm with no signs of ACS  Assessment/Plan Principal Problem:   Chest pain Active Problems:   Tobacco abuse   ETOH abuse   Hypertension   CAD (coronary artery  disease)    1. Chest pain-suspicious for possible unstable angina.  Continue to trend troponins with EKG in a.m. and monitor on telemetry.  Avoid nitroglycerin with recent Viagra use and use morphine as needed for chest pain.  Continue aspirin-with full dose given in the ED, as well as Coreg 6.25 mg twice daily for rate control with IV metoprolol push x 1 now.  Will check lipid panel/TSH and initiate statin with atorvastatin 80 mg for now.  Consult to cardiology for consideration of stress test inpatient.  Cardiac diet for now with n.p.o. after midnight in case there is a need for catheterization in a.m.  There appears to be no urgent indication for this currently after discussion with cardiologist Dr. Tenny Crawoss. 2. Hypertension.  Currently appears well controlled and will initiate Coreg as noted above and monitor closely. UDS pending. 3. Tobacco abuse.  Counseled on smoking cessation.  Nicotine patch. 4. Alcohol abuse.  Monitor in stepdown unit for DTs which I do not believe the patient currently has.  Place on CIWA protocol with Ativan prn.   DVT prophylaxis: Lovenox Code Status: Full Family Communication: None Disposition Plan: Continue on CIWA protocol and monitor for DTs; Rule out ACS Consults called:Cardiology Admission status: Observation; SDU   Ronon Ferger Hoover BrunetteD Calani Gick DO [facility] Pager [phone]  If 7PM-7AM, please contact night-coverage [email or web address] Password [email or web address]  06/16/2017, 1:39 PM

## 2017-06-16 NOTE — ED Provider Notes (Signed)
[facility] EMERGENCY DEPARTMENT Provider Note   CSN: [id number] Arrival date & time: 06/16/17  1055     History   Chief Complaint Chief Complaint  Patient presents with  . Chest Pain    HPI Craig Perry is a 57 y.o. male.  HPI Patient with history of CAD and STEMI last year requiring stent placement presents with 2 days of intermittent left upper arm numbness that started after taking a Viagra.  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  No radiation.  Was having numbness in both of his upper arms.  This started roughly 1 hour prior to presentation to the emergency department.  He states his chest pain and numbness have completely resolved at this time.  He has not taking any medications and has not followed up with his cardiologist since his hospitalization last year.  States he continues to smoke cigarettes roughly 3 packs daily.  He is decreasing his his alcohol intake but states that he had multiple beers yesterday with the last one being at 9 PM.  He has had no alcohol today.  States he feels shaky but this is normal for him.  He denies any lower extremity swelling or pain.  Patient has chronic shortness of breath which is unchanged.  Denies abdominal pain, nausea or vomiting. Past Medical History:  Diagnosis Date  . Alcohol abuse   . CAD (coronary artery disease)    a. 2014 cath with non obst CAD.    b. 04/2016: inferior STEMI s/p DES to RCA. 70% LAD wtih no intervention  . Hypertension   . Myocardial infarction (HCC)   . Pneumothorax   . Stroke [facility])     Patient Active Problem List   Diagnosis Date Noted  . Hypertension   . ST elevation myocardial infarction (STEMI) of inferior wall, initial episode of care [facility]): Type I MI 04/20/2016  . Alcoholic cardiomyopathy (HCC) 40/98/1191  . Tobacco abuse 03/07/2013  . ETOH abuse 03/07/2013  . Unintentional weight loss 03/07/2013     Past Surgical History:  Procedure Laterality Date  . ABDOMINAL SURGERY    . CARDIAC CATHETERIZATION N/A 04/20/2016   Procedure: Left Heart Cath and Coronary Angiography;  Surgeon: Marykay Lex, MD;  Location: [facility] INVASIVE CV LAB;  Service: Cardiovascular;  Laterality: N/A;  . CARDIAC CATHETERIZATION N/A 04/20/2016   Procedure: Coronary Stent Intervention;  Surgeon: Marykay Lex, MD;  Location: [facility] INVASIVE CV LAB;  Service: Cardiovascular;  Laterality: N/A;  . COLON SURGERY    . LEFT HEART CATHETERIZATION WITH CORONARY ANGIOGRAM N/A 03/08/2013   Procedure: LEFT HEART CATHETERIZATION WITH CORONARY ANGIOGRAM;  Surgeon: Micheline Chapman, MD;  Location: [facility] CATH LAB;  Service: Cardiovascular;  Laterality: N/A;  . LIVER SURGERY    . SPLENECTOMY         Home Medications    Prior to Admission medications   Medication Sig Start Date End Date Taking? Authorizing Provider  aspirin 81 MG EC tablet Take 1 tablet (81 mg total) by mouth daily. 05/24/16   Jodelle Gross, NP  nitroGLYCERIN (NITROSTAT) 0.4 MG SL tablet Place 1 tablet (0.4 mg total) under the tongue every 5 (five) minutes x 3 doses as needed for chest pain. 04/29/16   Jodelle Gross, NP    Family History Family History  Problem Relation Age of Onset  . Heart attack Mother   . Diabetes Mother   .  Stroke Mother   . Heart attack Father   . Diabetes Father     Social History Social History   Tobacco Use  . Smoking status: Current Every Day Smoker    Packs/day: 4.00    Years: 45.00    Pack years: 180.00    Types: Cigarettes  . Smokeless tobacco: Never Used  Substance Use Topics  . Alcohol use: Yes    Alcohol/week: 7.2 oz    Types: 12 Cans of beer per week    Comment: daily 18 beers  . Drug use: No     Allergies   Patient has no known allergies.   Review of Systems Review of Systems  Constitutional: Negative for chills and fever.  HENT: Negative for sore throat and trouble swallowing.   Eyes:  Negative for visual disturbance.  Respiratory: Positive for shortness of breath. Negative for cough.   Cardiovascular: Positive for chest pain. Negative for palpitations and leg swelling.  Gastrointestinal: Negative for abdominal pain, constipation, diarrhea, nausea and vomiting.  Genitourinary: Negative for dysuria, flank pain and frequency.  Musculoskeletal: Negative for arthralgias, back pain, myalgias, neck pain and neck stiffness.  Skin: Negative for rash and wound.  Neurological: Positive for tremors, light-headedness and numbness. Negative for dizziness, syncope, weakness and headaches.  All other systems reviewed and are negative.    Physical Exam Updated Vital Signs BP (!) 133/94   Pulse (!) 107   Temp 98.3 F (36.8 C) (Oral)   Resp (!) 23   Ht 5\' 11"  (1.803 m)   Wt 71.2 kg (157 lb)   SpO2 94%   BMI 21.90 kg/m   Physical Exam  Constitutional: He is oriented to person, place, and time. He appears well-developed and well-nourished. No distress.  HENT:  Head: Normocephalic and atraumatic.  Mouth/Throat: Oropharynx is clear and moist. No oropharyngeal exudate.  Eyes: EOM are normal. Pupils are equal, round, and reactive to light.  Neck: Normal range of motion. Neck supple. No JVD present.  Cardiovascular: Regular rhythm. Exam reveals no gallop and no friction rub.  No murmur heard. Tachycardia.  Pulmonary/Chest: Effort normal. No stridor. No respiratory distress. He has no wheezes. He has no rales. He exhibits no tenderness.  Diminished breath sounds throughout.  Abdominal: Soft. Bowel sounds are normal. There is no tenderness. There is no rebound and no guarding.  Musculoskeletal: Normal range of motion. He exhibits no edema or tenderness.  No lower extremity swelling, asymmetry or tenderness.  Bilateral radial pulses intact.  Lymphadenopathy:    He has no cervical adenopathy.  Neurological: He is alert and oriented to person, place, and time.  5/5 motor in all  extremities.  Sensation to light touch intact.  Skin: Skin is warm and dry. No rash noted. He is not diaphoretic. No erythema.  Psychiatric: He has a normal mood and affect. His behavior is normal.  Nursing note and vitals reviewed.    ED Treatments / Results  Labs (all labs ordered are listed, but only abnormal results are displayed) Labs Reviewed  COMPREHENSIVE METABOLIC PANEL - Abnormal; Notable for the following components:      Result Value   Chloride 99 (*)    Glucose, Bld 111 (*)    ALT 13 (*)    All other components within normal limits  CBC WITH DIFFERENTIAL/PLATELET  TROPONIN I  LIPASE, BLOOD  ETHANOL  RAPID URINE DRUG SCREEN, HOSP PERFORMED    EKG  EKG Interpretation  Date/Time:  Monday June 16 2017 11:03:07 EDT Ventricular  Rate:  125 PR Interval:  128 QRS Duration: 86 QT Interval:  306 QTC Calculation: 441 R Axis:   76 Text Interpretation:  Sinus tachycardia Nonspecific ST abnormality Abnormal ECG Confirmed by Loren Racer ([id number]) on 06/16/2017 11:12:29 AM       Radiology Dg Chest Port 1 View  Result Date: 06/16/2017 CLINICAL DATA:  Patient with chest pain.  Left arm numbness. EXAM: PORTABLE CHEST 1 VIEW COMPARISON:  Chest radiograph 04/19/2016 FINDINGS: Monitoring leads overlie the patient. Normal cardiac and mediastinal contours. Minimal heterogeneous opacities lung bases bilaterally. No pleural effusion or pneumothorax. IMPRESSION: Bibasilar atelectasis. Electronically Signed   By: Annia Belt M.D.   On: 06/16/2017 11:42    Procedures Procedures (including critical care time)  Medications Ordered in ED Medications  LORazepam (ATIVAN) injection 0-4 mg (not administered)    Or  LORazepam (ATIVAN) tablet 0-4 mg (not administered)  LORazepam (ATIVAN) injection 0-4 mg (not administered)    Or  LORazepam (ATIVAN) tablet 0-4 mg (not administered)  thiamine (VITAMIN B-1) tablet 100 mg (not administered)    Or  thiamine (B-1) injection 100 mg (not  administered)  aspirin chewable tablet 324 mg (324 mg Oral Given 06/16/17 1138)  LORazepam (ATIVAN) injection 1 mg (1 mg Intravenous Given 06/16/17 1139)    CRITICAL CARE Performed by: Loren Racer Total critical care time: 30 minutes Critical care time was exclusive of separately billable procedures and treating other patients. Critical care was necessary to treat or prevent imminent or life-threatening deterioration. Critical care was time spent personally by me on the following activities: development of treatment plan with patient and/or surrogate as well as nursing, discussions with consultants, evaluation of patient's response to treatment, examination of patient, obtaining history from patient or surrogate, ordering and performing treatments and interventions, ordering and review of laboratory studies, ordering and review of radiographic studies, pulse oximetry and re-evaluation of patient's condition. Initial Impression / Assessment and Plan / ED Course  I have reviewed the triage vital signs and the nursing notes.  Pertinent labs & imaging results that were available during my care of the patient were reviewed by me and considered in my medical decision making (see chart for details).     Patient presents with left-sided chest pain and arm numbness.  Concerning for anginal symptoms.  States chest pain has now resolved.  Remains tachycardic.  Concern for possible alcohol withdrawal. Improved heart rate and tremor after Ativan.  Placed on CIWA protocol. Continues to have no numbness or chest pain.  Will discuss with cardiology.  With Dr. Tenny Craw.  Advises admission to hospitalist and trending troponins.  Dr. Sherryll Burger will see patient and admit to stepdown bed. Final Clinical Impressions(s) / ED Diagnoses   Final diagnoses:  Coronary artery disease with exertional angina [facility])  Alcohol abuse  Tachycardia    ED Discharge Orders    None       Loren Racer, MD 06/16/17 1330

## 2017-06-16 NOTE — ED Notes (Signed)
Patient has been unable to provide urine sample.

## 2017-06-16 NOTE — ED Triage Notes (Signed)
Pt thinks he is having a heart attack. States his chest pain is coming and going. Both arms are feeling numb and this is ongoing for 2 days. Has already had a stent put in before. States he has had 3 prior heart attacks.

## 2017-06-17 ENCOUNTER — Observation Stay (HOSPITAL_BASED_OUTPATIENT_CLINIC_OR_DEPARTMENT_OTHER): Payer: Self-pay

## 2017-06-17 ENCOUNTER — Emergency Department (HOSPITAL_COMMUNITY)
Admission: EM | Admit: 2017-06-17 | Discharge: 2017-06-18 | Disposition: A | Discharge status: Home / Self Care | Payer: Self-pay | Attending: Emergency Medicine | Admitting: Emergency Medicine

## 2017-06-17 ENCOUNTER — Encounter (HOSPITAL_COMMUNITY): Payer: Self-pay | Admitting: Emergency Medicine

## 2017-06-17 ENCOUNTER — Other Ambulatory Visit: Payer: Self-pay

## 2017-06-17 ENCOUNTER — Encounter (HOSPITAL_COMMUNITY): Payer: Self-pay

## 2017-06-17 ENCOUNTER — Emergency Department (HOSPITAL_COMMUNITY): Payer: Self-pay

## 2017-06-17 ENCOUNTER — Emergency Department (HOSPITAL_COMMUNITY)
Admission: EM | Admit: 2017-06-17 | Discharge: 2017-06-17 | Disposition: A | Discharge status: Home / Self Care | Payer: Self-pay | Attending: Emergency Medicine | Admitting: Emergency Medicine

## 2017-06-17 DIAGNOSIS — R0789 Other chest pain: Secondary | ICD-10-CM | POA: Insufficient documentation

## 2017-06-17 DIAGNOSIS — F1020 Alcohol dependence, uncomplicated: Secondary | ICD-10-CM | POA: Insufficient documentation

## 2017-06-17 DIAGNOSIS — R072 Precordial pain: Secondary | ICD-10-CM | POA: Insufficient documentation

## 2017-06-17 DIAGNOSIS — R079 Chest pain, unspecified: Secondary | ICD-10-CM

## 2017-06-17 DIAGNOSIS — Z7982 Long term (current) use of aspirin: Secondary | ICD-10-CM | POA: Insufficient documentation

## 2017-06-17 DIAGNOSIS — R200 Anesthesia of skin: Secondary | ICD-10-CM | POA: Insufficient documentation

## 2017-06-17 DIAGNOSIS — I259 Chronic ischemic heart disease, unspecified: Secondary | ICD-10-CM | POA: Insufficient documentation

## 2017-06-17 DIAGNOSIS — Z79899 Other long term (current) drug therapy: Secondary | ICD-10-CM | POA: Insufficient documentation

## 2017-06-17 DIAGNOSIS — I10 Essential (primary) hypertension: Secondary | ICD-10-CM | POA: Insufficient documentation

## 2017-06-17 DIAGNOSIS — F17210 Nicotine dependence, cigarettes, uncomplicated: Secondary | ICD-10-CM | POA: Insufficient documentation

## 2017-06-17 DIAGNOSIS — F1010 Alcohol abuse, uncomplicated: Secondary | ICD-10-CM

## 2017-06-17 DIAGNOSIS — Z5321 Procedure and treatment not carried out due to patient leaving prior to being seen by health care provider: Secondary | ICD-10-CM | POA: Insufficient documentation

## 2017-06-17 DIAGNOSIS — R45851 Suicidal ideations: Secondary | ICD-10-CM | POA: Insufficient documentation

## 2017-06-17 LAB — NM MYOCAR MULTI W/SPECT W/WALL MOTION / EF
CHL CUP NUCLEAR SSS: 14
LHR: 0.46
LV dias vol: 121 mL (ref 62–150)
LV sys vol: 85 mL
Peak HR: 108 {beats}/min
Rest HR: 88 {beats}/min
SDS: 5
SRS: 11
TID: 0.92

## 2017-06-17 LAB — CBC
HCT: 43.7 % (ref 39.0–52.0)
Hemoglobin: 14.1 g/dL (ref 13.0–17.0)
MCH: 28.2 pg (ref 26.0–34.0)
MCHC: 32.3 g/dL (ref 30.0–36.0)
MCV: 87.4 fL (ref 78.0–100.0)
Platelets: 143 10*3/uL — ABNORMAL LOW (ref 150–400)
RBC: 5 MIL/uL (ref 4.22–5.81)
RDW: 15.1 % (ref 11.5–15.5)
WBC: 4.8 10*3/uL (ref 4.0–10.5)

## 2017-06-17 LAB — BASIC METABOLIC PANEL
Anion gap: 8 (ref 5–15)
BUN: 18 mg/dL (ref 6–20)
CO2: 26 mmol/L (ref 22–32)
Calcium: 8.6 mg/dL — ABNORMAL LOW (ref 8.9–10.3)
Chloride: 101 mmol/L (ref 101–111)
Creatinine, Ser: 1.01 mg/dL (ref 0.61–1.24)
GFR calc Af Amer: >60 mL/min (ref >60)
GFR calc non Af Amer: >60 mL/min (ref >60)
Glucose, Bld: 128 mg/dL — ABNORMAL HIGH (ref 65–99)
Potassium: 3.9 mmol/L (ref 3.5–5.1)
Sodium: 135 mmol/L (ref 135–145)

## 2017-06-17 LAB — TROPONIN I: Troponin I: <0.03 ng/mL (ref <0.03)

## 2017-06-17 LAB — HIV ANTIBODY (ROUTINE TESTING W REFLEX): HIV Screen 4th Generation wRfx: NONREACTIVE

## 2017-06-17 LAB — GLUCOSE, CAPILLARY: Glucose-Capillary: 136 mg/dL — ABNORMAL HIGH (ref 65–99)

## 2017-06-17 MED ORDER — TECHNETIUM TC 99M TETROFOSMIN IV KIT
10.0000 | PACK | Freq: Once | INTRAVENOUS | Status: AC | PRN
  Administered 2017-06-17: 10 via INTRAVENOUS

## 2017-06-17 MED ORDER — TECHNETIUM TC 99M TETROFOSMIN IV KIT
30.0000 | PACK | Freq: Once | INTRAVENOUS | Status: AC | PRN
  Administered 2017-06-17: 33 via INTRAVENOUS

## 2017-06-17 MED ORDER — SODIUM CHLORIDE 0.9% FLUSH
INTRAVENOUS | Status: AC
  Administered 2017-06-17: 10 mL via INTRAVENOUS
  Filled 2017-06-17: qty 10

## 2017-06-17 MED ORDER — REGADENOSON 0.4 MG/5ML IV SOLN
INTRAVENOUS | Status: AC
  Administered 2017-06-17: 0.4 mg via INTRAVENOUS
  Filled 2017-06-17: qty 5

## 2017-06-17 NOTE — ED Notes (Signed)
Patient called in waiting room at 2053 with no answer.

## 2017-06-17 NOTE — Progress Notes (Signed)
Progress Note  Patient Name: Craig Perry Date of Encounter: 06/17/2017  Primary Cardiologist:  Tenny Crawoss  Subjective   No chest pain   Inpatient Medications    Scheduled Meds: . aspirin EC  81 mg Oral Daily  . atorvastatin  80 mg Oral q1800  . carvedilol  6.25 mg Oral BID WC  . enoxaparin (LOVENOX) injection  40 mg Subcutaneous Q24H  . LORazepam  0-4 mg Intravenous Q6H   Or  . LORazepam  0-4 mg Oral Q6H  . losartan  25 mg Oral Daily  . sodium chloride flush  3 mL Intravenous Q12H  . thiamine  100 mg Oral Daily   Or  . thiamine  100 mg Intravenous Daily   Continuous Infusions: . sodium chloride     PRN Meds: sodium chloride, acetaminophen **OR** acetaminophen, morphine injection, ondansetron **OR** ondansetron (ZOFRAN) IV, sodium chloride flush   Vital Signs    Vitals:   06/17/17 0300 06/17/17 0400 06/17/17 0500 06/17/17 0600  BP: 112/77 116/81 114/75 115/64  Pulse: 76 79 83 82  Resp: (!) 22 19 20 18   Temp:  97.9 F (36.6 C)    TempSrc:  Oral    SpO2: 92% 91% 91% 93%  Weight:   142 lb 3.2 oz (64.5 kg)   Height:        Intake/Output Summary (Last 24 hours) at 06/17/2017 0742 Last data filed at 06/16/2017 2200 Gross per 24 hour  Intake 460 ml  Output -  Net 460 ml   Filed Weights   06/16/17 1100 06/16/17 2003 06/17/17 0500  Weight: 157 lb (71.2 kg) 142 lb 3.2 oz (64.5 kg) 142 lb 3.2 oz (64.5 kg)    Telemetry    NSR rates 80's  - Personally Reviewed  ECG    NSR no acute ST changes  - Personally Reviewed  Physical Exam  Disheveled male  GEN: No acute distress.   Neck: No JVD Cardiac: RRR, no murmurs, rubs, or gallops.  Respiratory: Clear to auscultation bilaterally. GI: Soft, nontender, non-distended  MS: No edema; No deformity. Neuro:  Nonfocal  Psych: Normal affect   Labs    Chemistry Recent Labs  Lab 06/16/17 1123 06/17/17 0225  NA 136 135  K 4.2 3.9  CL 99* 101  CO2 22 26  GLUCOSE 111* 128*  BUN 14 18  CREATININE 1.02 1.01    CALCIUM 9.6 8.6*  PROT 7.9  --   ALBUMIN 4.2  --   AST 25  --   ALT 13*  --   ALKPHOS 84  --   BILITOT 0.8  --   GFRNONAA >60 >60  GFRAA >60 >60  ANIONGAP 15 8     Hematology Recent Labs  Lab 06/16/17 1123 06/17/17 0225  WBC 7.7 4.8  RBC 5.47 5.00  HGB 15.6 14.1  HCT 47.8 43.7  MCV 87.4 87.4  MCH 28.5 28.2  MCHC 32.6 32.3  RDW 15.2 15.1  PLT 197 143*    Cardiac Enzymes Recent Labs  Lab 06/16/17 1123 06/16/17 1429 06/16/17 2022 06/17/17 0225  TROPONINI <0.03 <0.03 <0.03 <0.03   No results for input(s): TROPIPOC in the last 168 hours.   BNPNo results for input(s): BNP, PROBNP in the last 168 hours.   DDimer No results for input(s): DDIMER in the last 168 hours.   Radiology    Dg Chest Port 1 View  Result Date: 06/16/2017 CLINICAL DATA:  Patient with chest pain.  Left arm numbness. EXAM: PORTABLE CHEST  1 VIEW COMPARISON:  Chest radiograph 04/19/2016 FINDINGS: Monitoring leads overlie the patient. Normal cardiac and mediastinal contours. Minimal heterogeneous opacities lung bases bilaterally. No pleural effusion or pneumothorax. IMPRESSION: Bibasilar atelectasis. Electronically Signed   By: Annia Belt M.D.   On: 06/16/2017 11:42    Cardiac Studies   Echo EF 25-30%  Patient Profile     57 y.o. male with ETOH abuse CAD with previous stent to RCA 04/20/16 and residual LAD disease Admitted with chest pain drinking and taking viagra. Had 18 beers and smokes 3 ppd.   Assessment & Plan    Chest Pain: 04/2016 stent to RCA Did take Brillinta for 7 months r/o no acute ECG changes will get lexiscan myovue this am to assess for anterior wall ischemia given residual LAD disease.   CHF:  euvolemic has not been on meds back on coreg and ARB currently  ETOH:  Major issue along with smoking CXR on admission with only atelectasis sats ok Withdrawal protocol per primary service   For questions or updates, please contact [facility] Please consult [email or web address] for  contact info under Cardiology/STEMI.      Signed, Charlton Haws, MD  06/17/2017, 7:42 AM

## 2017-06-17 NOTE — Discharge Summary (Signed)
Physician Discharge Summary  Craig Perry:[id number] DOB: 1960-08-12 DOA: 06/16/2017  PCP: Patient, No Pcp Per  Admit date: 06/16/2017  Discharge date: 06/17/2017  Admitted From:Home  Disposition:  Home  Recommendations for Outpatient Follow-up:   Patient has left AGAINST MEDICAL ADVICE.  Home Health:N/A  Equipment/Devices:N/A  Discharge Condition: Stable  CODE STATUS: Full  Diet recommendation: Heart Healthy  Brief/Interim Summary:  Craig Perry is a 57 y.o. male with medical history significant for alcoholic cardiomyopathy secondary to significant alcohol abuse, ongoing tobacco abuse, hypertension, and severe two-vessel CAD with inferior STEMI and placement of drug-eluting stent to RCA in 04/2016.  He states that he completed a six-month course of antiplatelet agents, but soon thereafter was not able to afford any of his medications and has not taken any of them since then.  He continues to smoke heavily approximately 3 packs/day and continues to drink beer on a semi-daily basis and did drink quite significantly yesterday.  He states that he began having substernal chest pain with radiation down his left arm that began on Saturday evening after he took some Viagra.  He states that the pain is similar to when he had his prior MI, but just not as intense.  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  He grew very concerned and had an episode of diaphoresis, but no nausea or vomiting or shortness of breath. He denies any lower extremity edema, palpitations, lightheadedness, or dizziness. States that he does have some exertional chest pain.  Patient was admitted with suspicion of ischemic chest pain to rule out acute coronary syndrome.  He was seen by cardiology and was restarted on blood pressure medications as well as statin as previously prescribed, but patient has been noncompliant with this.  Additionally, he has  undergone a nuclear stress test this morning per cardiology recommendations in the study has not currently been read.  Patient is adamant that he wants to leave at this time so that he can smoke and was extensively counseled not to do so and to remain in the hospital, but is adamant about leaving.  He is perfectly competent to make decisions and is otherwise alert and oriented x3.  He has signed the paperwork and is leaving AGAINST MEDICAL ADVICE at this time.  Discharge Diagnoses:  Principal Problem:   Chest pain Active Problems:   Tobacco abuse   ETOH abuse   Hypertension   CAD (coronary artery disease)   Acute on chronic combined systolic and diastolic CHF (congestive heart failure) (HCC)   No Known Allergies  Consultations:  Cardiology   Procedures/Studies: Dg Chest Port 1 View  Result Date: 06/16/2017 CLINICAL DATA:  Patient with chest pain.  Left arm numbness. EXAM: PORTABLE CHEST 1 VIEW COMPARISON:  Chest radiograph 04/19/2016 FINDINGS: Monitoring leads overlie the patient. Normal cardiac and mediastinal contours. Minimal heterogeneous opacities lung bases bilaterally. No pleural effusion or pneumothorax. IMPRESSION: Bibasilar atelectasis. Electronically Signed   By: Annia Belt M.D.   On: 06/16/2017 11:42     Discharge Exam: Vitals:   06/17/17 0900 06/17/17 1000  BP: 132/76 (!) 131/93  Pulse: 82 76  Resp: 15 (!) 21  Temp:    SpO2: 93% 94%   Vitals:   06/17/17 0800 06/17/17 0824 06/17/17 0900 06/17/17 1000  BP: 124/80 124/80 132/76 (!) 131/93  Pulse: 83 94 82 76  Resp: 20  15 (!) 21  Temp:  97.9 F (36.6 C)  TempSrc:  Oral    SpO2: 92%  93% 94%  Weight:      Height:        General: Pt is alert, awake, not in acute distress Cardiovascular: RRR, S1/S2 +, no rubs, no gallops Respiratory: CTA bilaterally, no wheezing, no rhonchi Abdominal: Soft, NT, ND, bowel sounds + Extremities: no edema, no cyanosis    The results of significant diagnostics from this  hospitalization (including imaging, microbiology, ancillary and laboratory) are listed below for reference.     Microbiology: Recent Results (from the past 240 hour(s))  MRSA PCR Screening     Status: None   Collection Time: 06/16/17  8:28 PM  Result Value Ref Range Status   MRSA by PCR NEGATIVE NEGATIVE Final    Comment:        The GeneXpert MRSA Assay (FDA approved for NASAL specimens only), is one component of a comprehensive MRSA colonization surveillance program. It is not intended to diagnose MRSA infection nor to guide or monitor treatment for MRSA infections. Performed at [facility], [address] [place], [place] [address]      Labs: BNP (last 3 results) No results for input(s): BNP in the last 8760 hours. Basic Metabolic Panel: Recent Labs  Lab 06/16/17 1123 06/17/17 0225  NA 136 135  K 4.2 3.9  CL 99* 101  CO2 22 26  GLUCOSE 111* 128*  BUN 14 18  CREATININE 1.02 1.01  CALCIUM 9.6 8.6*   Liver Function Tests: Recent Labs  Lab 06/16/17 1123  AST 25  ALT 13*  ALKPHOS 84  BILITOT 0.8  PROT 7.9  ALBUMIN 4.2   Recent Labs  Lab 06/16/17 1123  LIPASE 20   No results for input(s): AMMONIA in the last 168 hours. CBC: Recent Labs  Lab 06/16/17 1123 06/17/17 0225  WBC 7.7 4.8  NEUTROABS 5.6  --   HGB 15.6 14.1  HCT 47.8 43.7  MCV 87.4 87.4  PLT 197 143*   Cardiac Enzymes: Recent Labs  Lab 06/16/17 1123 06/16/17 1429 06/16/17 2022 06/17/17 0225  TROPONINI <0.03 <0.03 <0.03 <0.03   BNP: Invalid input(s): POCBNP CBG: Recent Labs  Lab 06/17/17 0737  GLUCAP 136*   D-Dimer No results for input(s): DDIMER in the last 72 hours. Hgb A1c No results for input(s): HGBA1C in the last 72 hours. Lipid Profile Recent Labs    06/16/17 1429  CHOL 182  HDL 69  LDLCALC 99  TRIG 69  CHOLHDL 2.6   Thyroid function studies Recent Labs    06/16/17 1429  TSH 1.022   Anemia work up No results for input(s): VITAMINB12, FOLATE,  FERRITIN, TIBC, IRON, RETICCTPCT in the last 72 hours. Urinalysis    Component Value Date/Time   COLORURINE YELLOW 04/10/2013 0015   APPEARANCEUR CLEAR 04/10/2013 0015   LABSPEC <1.005 (L) 04/10/2013 0015   PHURINE 5.5 04/10/2013 0015   GLUCOSEU NEGATIVE 04/10/2013 0015   HGBUR NEGATIVE 04/10/2013 0015   BILIRUBINUR NEGATIVE 04/10/2013 0015   KETONESUR NEGATIVE 04/10/2013 0015   PROTEINUR NEGATIVE 04/10/2013 0015   UROBILINOGEN 0.2 04/10/2013 0015   NITRITE NEGATIVE 04/10/2013 0015   LEUKOCYTESUR NEGATIVE 04/10/2013 0015   Sepsis Labs Invalid input(s): PROCALCITONIN,  WBC,  LACTICIDVEN Microbiology Recent Results (from the past 240 hour(s))  MRSA PCR Screening     Status: None   Collection Time: 06/16/17  8:28 PM  Result Value Ref Range Status   MRSA by PCR NEGATIVE NEGATIVE Final    Comment:  The GeneXpert MRSA Assay (FDA approved for NASAL specimens only), is one component of a comprehensive MRSA colonization surveillance program. It is not intended to diagnose MRSA infection nor to guide or monitor treatment for MRSA infections. Performed at [facility], [address] [place], [place] [address]      Time coordinating discharge: Over 30 minutes  SIGNED:   Erick Blinks, DO [facility] 06/17/2017, 12:28 PM Pager [phone]  If 7PM-7AM, please contact night-coverage [email or web address] Password [email or web address]

## 2017-06-17 NOTE — ED Notes (Signed)
No call in waiting room X3 for room placement.

## 2017-06-17 NOTE — Clinical Social Work Note (Signed)
CSW consult had been received for ETOH treatment resources. Went to see pt and was informed that pt had left AMA so unable to assess and assist as requested.

## 2017-06-17 NOTE — Progress Notes (Signed)
Patient back from stress test and stating that he was ready to leave. Dr Sherryll BurgerShah paged to come see patient about leaving AMA. This nurse went in to speak to patient and talk to him about risks and benefits of leaving without the doctor clearing him. Patient stated that he did not care and just wanted to go home. Patient has a ride meeting him at the ER. Dr. Sherryll BurgerShah came to room and spoke to patient and could not convince him to stay. IV removed.

## 2017-06-17 NOTE — ED Triage Notes (Signed)
[employer] reports being called by pt x wife for them to go check on pt. X wife told police, that pt stated he was going to hurt himself and requested they go and check on him. When [employer] arrived at pt house, pt said he had been thinking about his family and was having thoughts of hurting himself. Pt also stated that he had been to the ED earlier today to be evaluated for chest pain, but he left b/c the wait was to long. Pt denies SI/HI in triage and states he "wants to be seen for chest pains."

## 2017-06-17 NOTE — ED Notes (Signed)
Patient called in waiting room at 2047 with no answer.

## 2017-06-17 NOTE — ED Triage Notes (Addendum)
Pt c/o of central chest pain with bilateral arm numbness.  Pt left AMA today from ICU to smoke and drink.  Pt states having the same symptoms from yesterday.   Pt has been drinking beer today before arriving

## 2017-06-18 ENCOUNTER — Encounter (HOSPITAL_COMMUNITY): Payer: Self-pay | Admitting: Emergency Medicine

## 2017-06-18 LAB — CBC WITH DIFFERENTIAL/PLATELET
Basophils Absolute: 0 10*3/uL (ref 0.0–0.1)
Basophils Relative: 0 %
EOS ABS: 0.2 10*3/uL (ref 0.0–0.7)
EOS PCT: 3 %
HCT: 45.4 % (ref 39.0–52.0)
Hemoglobin: 14.9 g/dL (ref 13.0–17.0)
LYMPHS ABS: 1.2 10*3/uL (ref 0.7–4.0)
Lymphocytes Relative: 25 %
MCH: 28.8 pg (ref 26.0–34.0)
MCHC: 32.8 g/dL (ref 30.0–36.0)
MCV: 87.6 fL (ref 78.0–100.0)
MONO ABS: 0.7 10*3/uL (ref 0.1–1.0)
Monocytes Relative: 14 %
Neutro Abs: 2.8 10*3/uL (ref 1.7–7.7)
Neutrophils Relative %: 58 %
PLATELETS: 155 10*3/uL (ref 150–400)
RBC: 5.18 MIL/uL (ref 4.22–5.81)
RDW: 14.7 % (ref 11.5–15.5)
WBC: 4.9 10*3/uL (ref 4.0–10.5)

## 2017-06-18 LAB — COMPREHENSIVE METABOLIC PANEL
ALT: 10 U/L — AB (ref 17–63)
AST: 20 U/L (ref 15–41)
Albumin: 3.7 g/dL (ref 3.5–5.0)
Alkaline Phosphatase: 68 U/L (ref 38–126)
Anion gap: 11 (ref 5–15)
BILIRUBIN TOTAL: 0.5 mg/dL (ref 0.3–1.2)
BUN: 9 mg/dL (ref 6–20)
CO2: 22 mmol/L (ref 22–32)
CREATININE: 0.77 mg/dL (ref 0.61–1.24)
Calcium: 8.9 mg/dL (ref 8.9–10.3)
Chloride: 99 mmol/L — ABNORMAL LOW (ref 101–111)
GFR calc Af Amer: >60 mL/min (ref >60)
GFR calc non Af Amer: >60 mL/min (ref >60)
GLUCOSE: 95 mg/dL (ref 65–99)
Potassium: 3.8 mmol/L (ref 3.5–5.1)
Sodium: 132 mmol/L — ABNORMAL LOW (ref 135–145)
TOTAL PROTEIN: 7.1 g/dL (ref 6.5–8.1)

## 2017-06-18 LAB — RAPID URINE DRUG SCREEN, HOSP PERFORMED
Amphetamines: NOT DETECTED
BARBITURATES: NOT DETECTED
BENZODIAZEPINES: POSITIVE — AB
Cocaine: NOT DETECTED
Opiates: NOT DETECTED
Tetrahydrocannabinol: NOT DETECTED

## 2017-06-18 LAB — ETHANOL: ALCOHOL ETHYL (B): 81 mg/dL — AB (ref <10)

## 2017-06-18 LAB — TROPONIN I

## 2017-06-18 MED ORDER — THIAMINE HCL 100 MG/ML IJ SOLN
100.0000 mg | Freq: Every day | INTRAMUSCULAR | Status: DC
  Filled 2017-06-18: qty 2

## 2017-06-18 MED ORDER — ASPIRIN EC 81 MG PO TBEC
81.0000 mg | DELAYED_RELEASE_TABLET | Freq: Every day | ORAL | Status: DC
  Administered 2017-06-18: 81 mg via ORAL
  Filled 2017-06-18: qty 1

## 2017-06-18 MED ORDER — NICOTINE 21 MG/24HR TD PT24
21.0000 mg | MEDICATED_PATCH | Freq: Every day | TRANSDERMAL | Status: DC
  Administered 2017-06-18: 21 mg via TRANSDERMAL
  Filled 2017-06-18: qty 1

## 2017-06-18 MED ORDER — LORAZEPAM 1 MG PO TABS: 0.0000 mg | ORAL_TABLET | Freq: Two times a day (BID) | ORAL | Status: DC

## 2017-06-18 MED ORDER — VITAMIN B-1 100 MG PO TABS
100.0000 mg | ORAL_TABLET | Freq: Every day | ORAL | Status: DC
  Administered 2017-06-18: 100 mg via ORAL
  Filled 2017-06-18: qty 1

## 2017-06-18 MED ORDER — NITROGLYCERIN 0.4 MG SL SUBL: 0.4000 mg | SUBLINGUAL_TABLET | SUBLINGUAL | Status: DC | PRN

## 2017-06-18 MED ORDER — LORAZEPAM 1 MG PO TABS: 0.0000 mg | ORAL_TABLET | Freq: Four times a day (QID) | ORAL | Status: DC

## 2017-06-18 MED ORDER — LORAZEPAM 2 MG/ML IJ SOLN: 0.0000 mg | Freq: Four times a day (QID) | INTRAMUSCULAR | Status: DC

## 2017-06-18 MED ORDER — LORAZEPAM 2 MG/ML IJ SOLN: 0.0000 mg | Freq: Two times a day (BID) | INTRAMUSCULAR | Status: DC

## 2017-06-18 NOTE — ED Notes (Signed)
[facility] called, patient has been psychiatrically cleared, MD made aware.

## 2017-06-18 NOTE — Discharge Instructions (Signed)
Follow-up with outpatient treatment as instructed by behavioral health °

## 2017-06-18 NOTE — ED Notes (Signed)
Pt informed that he will be held in the ED until reevaluation after shift change. Pt calm, cooperative and pleasant. Resting comfortably in the bed at this time.

## 2017-06-18 NOTE — Consult Note (Signed)
  Tele Assessment   Craig Perry, 57 y.o., male patient seen several times [facility] with complaints of chest pain; On 06/17/17 patient wife had concerns that patient was going to hurt himself; called and asked staff to check on patient.  Patient seen via telepsych by this provider; chart reviewed and consulted with Dr. Lucianne MussKumar on 06/18/17.  On evaluation Craig Perry reports he is in the hospital "Cause my wife called in on me add said I was going to hang myself; but I wasn't.  She was upset cause I left the hospital before I saw the doctor and we got into a argument; but I was feeling better so I left.  I been to the hospital.  Patient states that he has been to the hospital twice with chest pain but felt better.  States that he has a cardiac stent placed last year and when ever he gets chest pain he comes to the ER.  Patient states that he has had not any depression; he denies suicidal/self-harm/homicidal ideations, psychosis, and paranoia.  Patient states that he is employed and lives with his wife.  "it's my girlfriend but I call her wife."Prior history of inpatient psychiatric treatment 2002 when a prior girlfriend he was going to marry died prior to the wedding.     During evaluation Craig Perry is alert/oriented x 4; calm/cooperative with pleasant affect.  He does not appear to be responding to internal/external stimuli.  Patient denies suicidal/self-harm/homicidal ideation, psychosis, and paranoia.  Patient answered question appropriately.  Patient psychiatrically cleared.    Recommendations:  Disposition: No evidence of imminent risk to self or others at present.   Patient does not meet criteria for psychiatric inpatient admission.    Spoke with Marcelino DusterMichelle, RN (patient's nurse) informed patient psychiatrically cleared; will inform EDP,  Assunta FoundShuvon Eulogia Dismore, NP

## 2017-06-18 NOTE — ED Notes (Signed)
Pt belongings placed in locker (two lockers total of two bags)

## 2017-06-18 NOTE — ED Provider Notes (Signed)
[facility] EMERGENCY DEPARTMENT Provider Note   CSN: [id number] Arrival date & time: 06/17/17  2318     History   Chief Complaint Chief Complaint  Patient presents with  . V70.1    HPI Craig Perry is a 57 y.o. male.  The history is provided by the patient.  Chest Pain   This is a new problem. The problem occurs constantly. The problem has not changed since onset.Pain location: Left chest. The pain is moderate. The pain does not radiate. Associated symptoms include shortness of breath. Pertinent negatives include no fever. He has tried nothing for the symptoms. Risk factors include male gender and smoking/tobacco exposure.  His past medical history is significant for CAD.   History of CAD, alcohol abuse presents with chest pain.  He reports his been having constant chest pain for the past several days.  He also reports shortness of breath.  He was recently in the hospital, but left AMA.  He arrived to the hospital with police.  Apparently he had threatened at home to harm himself.  He now denies SI/HI Reports he does not want to talk about this anymore Past Medical History:  Diagnosis Date  . Alcohol abuse   . CAD (coronary artery disease)    a. 2014 cath with non obst CAD.    b. 04/2016: inferior STEMI s/p DES to RCA. 70% LAD wtih no intervention  . Hypertension   . Myocardial infarction (HCC)   . Pneumothorax   . Stroke [facility])     Patient Active Problem List   Diagnosis Date Noted  . CAD (coronary artery disease) 06/16/2017  . Acute on chronic combined systolic and diastolic CHF (congestive heart failure) (HCC)   . Hypertension   . ST elevation myocardial infarction (STEMI) of inferior wall, initial episode of care [facility]): Type I MI 04/20/2016  . Alcoholic cardiomyopathy (HCC) 11/91/4782  . Chest pain 03/07/2013  . Tobacco abuse 03/07/2013  . ETOH abuse 03/07/2013  . Unintentional weight loss 03/07/2013    Past Surgical History:  Procedure Laterality Date  .  ABDOMINAL SURGERY    . CARDIAC CATHETERIZATION N/A 04/20/2016   Procedure: Left Heart Cath and Coronary Angiography;  Surgeon: Marykay Lex, MD;  Location: [facility] INVASIVE CV LAB;  Service: Cardiovascular;  Laterality: N/A;  . CARDIAC CATHETERIZATION N/A 04/20/2016   Procedure: Coronary Stent Intervention;  Surgeon: Marykay Lex, MD;  Location: [facility] INVASIVE CV LAB;  Service: Cardiovascular;  Laterality: N/A;  . COLON SURGERY    . LEFT HEART CATHETERIZATION WITH CORONARY ANGIOGRAM N/A 03/08/2013   Procedure: LEFT HEART CATHETERIZATION WITH CORONARY ANGIOGRAM;  Surgeon: Micheline Chapman, MD;  Location: [facility] CATH LAB;  Service: Cardiovascular;  Laterality: N/A;  . LIVER SURGERY    . SPLENECTOMY         Home Medications    Prior to Admission medications   Medication Sig Start Date End Date Taking? Authorizing Provider  aspirin 81 MG EC tablet Take 1 tablet (81 mg total) by mouth daily. 05/24/16   Jodelle Gross, NP  nitroGLYCERIN (NITROSTAT) 0.4 MG SL tablet Place 1 tablet (0.4 mg total) under the tongue every 5 (five) minutes x 3 doses as needed for chest pain. 04/29/16   Jodelle Gross, NP    Family History Family History  Problem Relation Age of Onset  . Heart attack Mother   . Diabetes Mother   . Stroke Mother   . Heart attack Father   . Diabetes Father  Social History Social History   Tobacco Use  . Smoking status: Current Every Day Smoker    Packs/day: 2.00    Years: 45.00    Pack years: 90.00    Types: Cigarettes  . Smokeless tobacco: Never Used  Substance Use Topics  . Alcohol use: Yes    Alcohol/week: 7.2 oz    Types: 12 Cans of beer per week    Comment: daily 18 beers (reports he drank this amount yesterday)   . Drug use: No     Allergies   Patient has no known allergies.   Review of Systems Review of Systems  Constitutional: Negative for fever.  Respiratory: Positive for shortness of breath.   Cardiovascular: Positive for chest pain.  All  other systems reviewed and are negative.    Physical Exam Updated Vital Signs BP 114/86 (BP Location: Right Arm)   Pulse 95   Temp 97.7 F (36.5 C) (Oral)   Resp 20   Ht 1.803 m (5\' 11" )   Wt 64.4 kg (142 lb)   SpO2 96%   BMI 19.80 kg/m   Physical Exam CONSTITUTIONAL: Disheveled, no acute distress HEAD: Normocephalic/atraumatic EYES: EOMI/PERRL ENMT: Mucous membranes moist NECK: supple no meningeal signs SPINE/BACK:entire spine nontender CV: S1/S2 noted, no murmurs/rubs/gallops noted LUNGS: Lungs are clear to auscultation bilaterally, no apparent distress ABDOMEN: soft, nontender, no rebound or guarding, bowel sounds noted throughout abdomen NEURO: Pt is awake/alert/appropriate, moves all extremitiesx4.  No facial droop.   EXTREMITIES: pulses normal/equal x4, full ROM, no lower extremity edema SKIN: warm, color normal PSYCH: Flat affect  ED Treatments / Results  Labs (all labs ordered are listed, but only abnormal results are displayed) Labs Reviewed  COMPREHENSIVE METABOLIC PANEL - Abnormal; Notable for the following components:      Result Value   Sodium 132 (*)    Chloride 99 (*)    ALT 10 (*)    All other components within normal limits  ETHANOL - Abnormal; Notable for the following components:   Alcohol, Ethyl (B) 81 (*)    All other components within normal limits  CBC WITH DIFFERENTIAL/PLATELET  TROPONIN I  RAPID URINE DRUG SCREEN, HOSP PERFORMED    EKG  EKG Interpretation  Date/Time:  Wednesday June 18 2017 00:26:40 EDT Ventricular Rate:  93 PR Interval:  130 QRS Duration: 96 QT Interval:  356 QTC Calculation: 442 R Axis:   70 Text Interpretation:  Normal sinus rhythm Possible Inferior infarct , age undetermined ST & T wave abnormality, consider lateral ischemia Abnormal ECG Confirmed by Zadie RhineWickline, Yaroslav Gombos ([id number]) on 06/18/2017 12:33:13 AM       Radiology Dg Chest 2 View  Result Date: 06/17/2017 CLINICAL DATA:  Chest pain EXAM: CHEST - 2 VIEW  COMPARISON:  06/16/2017, 04/19/2016, CT chest 03/08/2013 FINDINGS: Hyperinflation with emphysematous disease. No acute opacity or pleural effusion. Stable cardiomediastinal silhouette. Symmetrical faint nodular opacities at the lower lung zones, possible nipple shadows. IMPRESSION: No active cardiopulmonary disease. Hyperinflation with emphysematous disease. Electronically Signed   By: Jasmine PangKim  Fujinaga M.D.   On: 06/17/2017 19:18   Nm Myocar Multi W/spect W/wall Motion / Ef  Result Date: 06/17/2017  Findings consistent with prior myocardial infarction.  This is a high risk study.  The left ventricular ejection fraction is moderately decreased (30-44%).  Large inferior wall infarct from apex to base No ischemia Inferior wall hypokinesis estimated EF 30% suggest echo correlation   Dg Chest Port 1 View  Result Date: 06/16/2017 CLINICAL DATA:  Patient with  chest pain.  Left arm numbness. EXAM: PORTABLE CHEST 1 VIEW COMPARISON:  Chest radiograph 04/19/2016 FINDINGS: Monitoring leads overlie the patient. Normal cardiac and mediastinal contours. Minimal heterogeneous opacities lung bases bilaterally. No pleural effusion or pneumothorax. IMPRESSION: Bibasilar atelectasis. Electronically Signed   By: Annia Belt M.D.   On: 06/16/2017 11:42    Procedures Procedures (including critical care time)  Medications Ordered in ED Medications - No data to display   Initial Impression / Assessment and Plan / ED Course  I have reviewed the triage vital signs and the nursing notes.  Pertinent labs results that were available during my care of the patient were reviewed by me and considered in my medical decision making (see chart for details).     12:51 AM Here for 2 issues.  He is here for chest pain.  He was just in the hospital and left AMA because he wanted to smoke and drink.  It seems he is having ongoing chest pain symptoms.  Repeat troponin is pending.  He did just have a nuclear stress that revealed no  ischemia, but previous infarct  There is also reported SI at home.  He is now denying this.  He will need psych consult 2:48 AM Patient reports he had ongoing chest pain for several days.  He now reports chest pain is resolved.  Troponin is negative.  He is now eating a sandwich and is in no distress.  Defer further workup at this time for chest pain.  Awaiting psych evaluation Final Clinical Impressions(s) / ED Diagnoses   Final diagnoses:  Precordial pain  Suicidal thoughts  Alcohol abuse    ED Discharge Orders    None       Zadie Rhine, MD 06/18/17 [phone]

## 2017-06-18 NOTE — BH Assessment (Addendum)
Tele Assessment Note   Patient Name: Craig Perry MRN: [id number] Referring Physician: Bebe Shaggy MD Location of Patient: [facility] Location of Provider: Behavioral Health TTS Department  Craig Perry is an 57 y.o. male who was returned to the [facility] tonight voluntarily after being seen in the ED earlier today and leaving AMA. Per ED notes, pt's ex-wife called 911 after she sts pt made a statement tonight that made her thinks he might hurt himself. Pt sts she misunderstood his remark and he sts he was being sarcastic or joking. Pt denies SI, SHI, HI and AVH. Pt denies all symptoms of depression and anxiety although, he expressed some anxiousness concerning his chronic medical conditions. Pt denies any panic attacks. Pt sts he is not seen by a psychiatrist or OP therapist and is not prescribed any psychiatric medications. Pt sts he has not had depression or SI "in a long time.... Years." Pt sts he was once psychiatrically hospitalized at [facility] "many years ago."  Pt sts he could not remember why he was there. Pt sts he has multiple chronic medical conditions which is why he sts he came into the ED earlier today. Pt has CAD and a hx of "Alcoholic Cardiomyopathy."   Pt sts he lives with his mother-in-law currently. Pt sts he is divorced from his wife. Pt sts he has no children. Pt sts he completed school through the 10th grade. 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 Pt denies any hx of verbal or physical aggression or any issues with LE. Pt denies any hx of abuse. Pt denies access to guns. Pt sts he sleeps about 7 hours per night and eats well and regularly with no significant weight changes recently. Pt sts he drinks about 7-8 12 oz beers daily and smokes about 2 packs of cigarettes daily. Pt's BAL was 81 when tested in the ED tonight. No UDS was available at the time of this assessment.  Pt was dressed in scrubs and sitting on his hospital bed. Pt was alert, cooperative and  polite. Pt kept good eye contact, spoke in a mumbling manner and at a quick pace. Pt moved in a normal manner when moving. Pt's thought process was coherent and relevant and judgement seemed unimpaired.  No indication of delusional thinking or response to internal stimuli. Pt's mood was stated as not depressed and "a little bit" anxious and his euthymic affect was congruent.  Pt was oriented x 4, to person, place, time and situation.   Diagnosis: F10.20 Alcohol Use D/O, Severe  Past Medical History:  Past Medical History:  Diagnosis Date  . Alcohol abuse   . CAD (coronary artery disease)    a. 2014 cath with non obst CAD.    b. 04/2016: inferior STEMI s/p DES to RCA. 70% LAD wtih no intervention  . Hypertension   . Myocardial infarction (HCC)   . Pneumothorax   . Stroke [facility])     Past Surgical History:  Procedure Laterality Date  . ABDOMINAL SURGERY    . CARDIAC CATHETERIZATION N/A 04/20/2016   Procedure: Left Heart Cath and Coronary Angiography;  Surgeon: Marykay Lex, MD;  Location: [facility] INVASIVE CV LAB;  Service: Cardiovascular;  Laterality: N/A;  . CARDIAC CATHETERIZATION N/A 04/20/2016   Procedure: Coronary Stent Intervention;  Surgeon: Marykay Lex, MD;  Location: [facility] INVASIVE CV LAB;  Service: Cardiovascular;  Laterality: N/A;  . COLON SURGERY    . LEFT HEART CATHETERIZATION WITH CORONARY ANGIOGRAM N/A 03/08/2013  Procedure: LEFT HEART CATHETERIZATION WITH CORONARY ANGIOGRAM;  Surgeon: Micheline Chapman, MD;  Location: [facility] CATH LAB;  Service: Cardiovascular;  Laterality: N/A;  . LIVER SURGERY    . SPLENECTOMY      Family History:  Family History  Problem Relation Age of Onset  . Heart attack Mother   . Diabetes Mother   . Stroke Mother   . Heart attack Father   . Diabetes Father     Social History:  reports that he has been smoking cigarettes.  He has a 90.00 pack-year smoking history. he has never used smokeless tobacco. He reports that he drinks about 7.2 oz of alcohol  per week. He reports that he does not use drugs.  Additional Social History:  Alcohol / Drug Use Prescriptions: STS NO RX PSYCH MEDS  History of alcohol / drug use?: Yes Longest period of sobriety (when/how long): UNKNOWN Substance #1 Name of Substance 1: ALCOHOL 1 - Age of First Use: 5 1 - Amount (size/oz): 7-8 12 OZ BEERS  1 - Frequency: DAILY 1 - Duration: ONGOING 1 - Last Use / Amount: TODAY Substance #2 Name of Substance 2: NICOTINE/CIGARETTES 2 - Age of First Use: 5 2 - Amount (size/oz): 2 PACKS 2 - Frequency: DAILY 2 - Duration: ONGOING 2 - Last Use / Amount: TODAY  CIWA: CIWA-Ar BP: 116/80 Pulse Rate: 95 Nausea and Vomiting: no nausea and no vomiting Tactile Disturbances: none Tremor: no tremor Auditory Disturbances: not present Paroxysmal Sweats: no sweat visible Visual Disturbances: not present Anxiety: no anxiety, at ease Headache, Fullness in Head: none present Agitation: normal activity Orientation and Clouding of Sensorium: oriented and can do serial additions CIWA-Ar Total: 0 COWS:    Allergies: No Known Allergies  Home Medications:  (Not in a hospital admission)  OB/GYN Status:  No LMP for male patient.  General Assessment Data Location of Assessment: [facility] TTS Assessment: In system Is this a Tele or Face-to-Face Assessment?: Tele Assessment Is this an Initial Assessment or a Re-assessment for this encounter?: Initial Assessment Marital status: Divorced Is patient pregnant?: No Pregnancy Status: No Living Arrangements: Non-relatives/Friends(STS LIVES WITH MOTHER IN LAW) Can pt return to current living arrangement?: Yes Admission Status: Voluntary Is patient capable of signing voluntary admission?: Yes Referral Source: Self/Family/Friend Insurance type: SELF PAY     Crisis Care Plan Living Arrangements: Non-relatives/Friends(STS LIVES WITH MOTHER IN LAW) Name of Psychiatrist: NONE Name of Therapist: NONE  Education Status Is patient  currently in school?: No Is the patient employed, unemployed or receiving disability?: Employed(AS Advertising copywriter)  Risk to self with the past 6 months Suicidal Ideation: No Has patient been a risk to self within the past 6 months prior to admission? : No Suicidal Intent: No Has patient had any suicidal intent within the past 6 months prior to admission? : No Is patient at risk for suicide?: No Suicidal Plan?: No Has patient had any suicidal plan within the past 6 months prior to admission? : No Access to Means: No(DENIES ACCESS TO GUNS) What has been your use of drugs/alcohol within the last 12 months?: DAILY USE Previous Attempts/Gestures: No How many times?: 0 Other Self Harm Risks: NONE REPORTED Triggers for Past Attempts: None known Intentional Self Injurious Behavior: None Family Suicide History: Unknown Recent stressful life event(s): Recent negative physical changes(MULTIPLE CHRONIC HEALTH CONDITIONS) Persecutory voices/beliefs?: No Depression: No Depression Symptoms: (DENIES SYMPTOMS OF DEPRESSION) Substance abuse history and/or treatment for substance abuse?: Yes Suicide prevention information given to non-admitted patients:  Not applicable  Risk to Others within the past 6 months Homicidal Ideation: No Does patient have any lifetime risk of violence toward others beyond the six months prior to admission? : No Thoughts of Harm to Others: No Current Homicidal Intent: No Current Homicidal Plan: No Access to Homicidal Means: No Identified Victim: NONE History of harm to others?: No Assessment of Violence: None Noted Violent Behavior Description: NA Does patient have access to weapons?: No Criminal Charges Pending?: No Does patient have a court date: No Is patient on probation?: No  Psychosis Hallucinations: None noted Delusions: None noted  Mental Status Report Appearance/Hygiene: Disheveled Eye Contact: Good Motor Activity: Freedom of movement Speech:  Logical/coherent Level of Consciousness: Quiet/awake Mood: Pleasant, Euthymic Affect: Appropriate to circumstance Anxiety Level: Moderate Thought Processes: Coherent, Relevant Judgement: Partial Orientation: Person, Place, Time, Situation Obsessive Compulsive Thoughts/Behaviors: None  Cognitive Functioning Concentration: Normal Memory: Recent Intact, Remote Intact Is patient IDD: No Is patient DD?: No Insight: Fair Impulse Control: Poor Appetite: Good Have you had any weight changes? : No Change Sleep: No Change Total Hours of Sleep: 7 Vegetative Symptoms: None  ADLScreening [facility] Assessment Services) Patient's cognitive ability adequate to safely complete daily activities?: Yes Patient able to express need for assistance with ADLs?: Yes Independently performs ADLs?: Yes (appropriate for developmental age)(NO BARRIERS REPORTED)  Prior Inpatient Therapy Prior Inpatient Therapy: Yes Prior Therapy Dates: 2000 Prior Therapy Facilty/Provider(s): [facility] Reason for Treatment: MDD  Prior Outpatient Therapy Prior Outpatient Therapy: No Does patient have an ACCT team?: No Does patient have Intensive In-House Services?  : No Does patient have [facility] services? : No Does patient have P4CC services?: No  ADL Screening (condition at time of admission) Patient's cognitive ability adequate to safely complete daily activities?: Yes Patient able to express need for assistance with ADLs?: Yes Independently performs ADLs?: Yes (appropriate for developmental age)(NO BARRIERS REPORTED)       Abuse/Neglect Assessment (Assessment to be complete while patient is alone) Physical Abuse: Denies Verbal Abuse: Denies Sexual Abuse: Denies Exploitation of patient/patient's resources: Denies Self-Neglect: Denies     Merchant navy officerAdvance Directives (For Healthcare) Does Patient Have a Medical Advance Directive?: No Would patient like information on creating a medical advance directive?: No - Patient  declined          Disposition:  Disposition Initial Assessment Completed for this Encounter: Yes Patient referred to: Other (Comment)  This service was provided via telemedicine using a 2-way, interactive audio and video technology.  Names of all persons participating in this telemedicine service and their role in this encounter. Name: Beryle FlockMary Yamir Carignan, MS, [facility], [facility] Role: Triage Specialist  Name: Salvatore Decenthomas Dame Role: Patient  Name:  Role:   Name:  Role:    Consulted with Donell SievertSpencer Simon PA. Recommend continued observation with later re-evaluation for final disposition.  Spoke with Dr. Bebe ShaggyWickline EDP at [facility] and advised of recommendation and rationale.   Beryle FlockMary Janeka Libman, MS, CRC, [facility] Triage Specialist [facility] Zavien Clubb T 06/18/2017 1:49 AM

## 2017-06-18 NOTE — ED Notes (Signed)
CIWA-0 Left AMA ICU- left ED yesterday after wait for CP Medically cleared CP Holding for re-eval by psych

## 2017-07-20 ENCOUNTER — Emergency Department (HOSPITAL_COMMUNITY)
Admission: EM | Admit: 2017-07-20 | Discharge: 2017-07-21 | Disposition: A | Discharge status: Other Acute Inpatient Hospital | Payer: Self-pay | Attending: Emergency Medicine | Admitting: Emergency Medicine

## 2017-07-20 ENCOUNTER — Other Ambulatory Visit: Payer: Self-pay

## 2017-07-20 ENCOUNTER — Encounter (HOSPITAL_COMMUNITY): Payer: Self-pay | Admitting: Emergency Medicine

## 2017-07-20 DIAGNOSIS — F1020 Alcohol dependence, uncomplicated: Secondary | ICD-10-CM | POA: Insufficient documentation

## 2017-07-20 DIAGNOSIS — R45851 Suicidal ideations: Secondary | ICD-10-CM | POA: Insufficient documentation

## 2017-07-20 DIAGNOSIS — F333 Major depressive disorder, recurrent, severe with psychotic symptoms: Secondary | ICD-10-CM | POA: Insufficient documentation

## 2017-07-20 DIAGNOSIS — Z79899 Other long term (current) drug therapy: Secondary | ICD-10-CM | POA: Insufficient documentation

## 2017-07-20 DIAGNOSIS — F17210 Nicotine dependence, cigarettes, uncomplicated: Secondary | ICD-10-CM | POA: Insufficient documentation

## 2017-07-20 DIAGNOSIS — I2510 Atherosclerotic heart disease of native coronary artery without angina pectoris: Secondary | ICD-10-CM | POA: Insufficient documentation

## 2017-07-20 DIAGNOSIS — I10 Essential (primary) hypertension: Secondary | ICD-10-CM | POA: Insufficient documentation

## 2017-07-20 DIAGNOSIS — Z7982 Long term (current) use of aspirin: Secondary | ICD-10-CM | POA: Insufficient documentation

## 2017-07-20 LAB — COMPREHENSIVE METABOLIC PANEL
ALBUMIN: 4 g/dL (ref 3.5–5.0)
ALT: 10 U/L — AB (ref 17–63)
AST: 18 U/L (ref 15–41)
Alkaline Phosphatase: 75 U/L (ref 38–126)
Anion gap: 12 (ref 5–15)
BUN: 12 mg/dL (ref 6–20)
CHLORIDE: 104 mmol/L (ref 101–111)
CO2: 23 mmol/L (ref 22–32)
Calcium: 9.4 mg/dL (ref 8.9–10.3)
Creatinine, Ser: 1.02 mg/dL (ref 0.61–1.24)
GFR calc non Af Amer: >60 mL/min (ref >60)
GLUCOSE: 120 mg/dL — AB (ref 65–99)
Potassium: 4 mmol/L (ref 3.5–5.1)
SODIUM: 139 mmol/L (ref 135–145)
Total Bilirubin: 0.3 mg/dL (ref 0.3–1.2)
Total Protein: 7.4 g/dL (ref 6.5–8.1)

## 2017-07-20 LAB — RAPID URINE DRUG SCREEN, HOSP PERFORMED
AMPHETAMINES: NOT DETECTED
BENZODIAZEPINES: NOT DETECTED
Barbiturates: NOT DETECTED
COCAINE: NOT DETECTED
OPIATES: NOT DETECTED
TETRAHYDROCANNABINOL: NOT DETECTED

## 2017-07-20 LAB — CBC
HCT: 45 % (ref 39.0–52.0)
HEMOGLOBIN: 14.9 g/dL (ref 13.0–17.0)
MCH: 28.5 pg (ref 26.0–34.0)
MCHC: 33.1 g/dL (ref 30.0–36.0)
MCV: 86.2 fL (ref 78.0–100.0)
Platelets: 161 10*3/uL (ref 150–400)
RBC: 5.22 MIL/uL (ref 4.22–5.81)
RDW: 14.9 % (ref 11.5–15.5)
WBC: 8.9 10*3/uL (ref 4.0–10.5)

## 2017-07-20 LAB — ACETAMINOPHEN LEVEL

## 2017-07-20 LAB — ETHANOL: Alcohol, Ethyl (B): 30 mg/dL — ABNORMAL HIGH (ref <10)

## 2017-07-20 LAB — SALICYLATE LEVEL

## 2017-07-20 MED ORDER — VITAMIN B-1 100 MG PO TABS
100.0000 mg | ORAL_TABLET | Freq: Every day | ORAL | Status: DC
  Administered 2017-07-20 – 2017-07-21 (×2): 100 mg via ORAL
  Filled 2017-07-20 (×2): qty 1

## 2017-07-20 MED ORDER — LORAZEPAM 2 MG/ML IJ SOLN: 0.0000 mg | Freq: Four times a day (QID) | INTRAMUSCULAR | Status: DC

## 2017-07-20 MED ORDER — LORAZEPAM 1 MG PO TABS
0.0000 mg | ORAL_TABLET | Freq: Four times a day (QID) | ORAL | Status: DC
  Administered 2017-07-20: 1 mg via ORAL
  Filled 2017-07-20: qty 1

## 2017-07-20 MED ORDER — THIAMINE HCL 100 MG/ML IJ SOLN: 100.0000 mg | Freq: Every day | INTRAMUSCULAR | Status: DC

## 2017-07-20 MED ORDER — LORAZEPAM 1 MG PO TABS: 0.0000 mg | ORAL_TABLET | Freq: Two times a day (BID) | ORAL | Status: DC

## 2017-07-20 MED ORDER — LORAZEPAM 2 MG/ML IJ SOLN: 0.0000 mg | Freq: Two times a day (BID) | INTRAMUSCULAR | Status: DC

## 2017-07-20 NOTE — ED Provider Notes (Signed)
[facility] EMERGENCY DEPARTMENT Provider Note   CSN: [id number] Arrival date & time: 07/20/17  2050     History   Chief Complaint Chief Complaint  Patient presents with  . V70.1    HPI Craig Perry is a 57 y.o. male.  HPI  Pt was seen at 2115. Per Police and pt, c/o gradual onset and persistence of constant SI for the past several weeks. Pt states he "plans to cut his throat." Endorses etoh use daily. The symptoms have been associated with no other complaints. The patient has a significant history of similar symptoms previously, recently being evaluated for this complaint and prior evaluation for same last month and was discharged from the ED. Pt did not f/u with mental health provider after discharge. Denies SA, no HI, no hallucinations.    Past Medical History:  Diagnosis Date  . Alcohol abuse   . CAD (coronary artery disease)    a. 2014 cath with non obst CAD.    b. 04/2016: inferior STEMI s/p DES to RCA. 70% LAD wtih no intervention  . Hypertension   . Myocardial infarction (HCC)   . Pneumothorax   . Stroke [facility])     Patient Active Problem List   Diagnosis Date Noted  . CAD (coronary artery disease) 06/16/2017  . Acute on chronic combined systolic and diastolic CHF (congestive heart failure) (HCC)   . Hypertension   . ST elevation myocardial infarction (STEMI) of inferior wall, initial episode of care [facility]): Type I MI 04/20/2016  . Alcoholic cardiomyopathy (HCC) 40/98/1191  . Chest pain 03/07/2013  . Tobacco abuse 03/07/2013  . ETOH abuse 03/07/2013  . Unintentional weight loss 03/07/2013    Past Surgical History:  Procedure Laterality Date  . ABDOMINAL SURGERY    . CARDIAC CATHETERIZATION N/A 04/20/2016   Procedure: Left Heart Cath and Coronary Angiography;  Surgeon: Marykay Lex, MD;  Location: [facility] INVASIVE CV LAB;  Service: Cardiovascular;  Laterality: N/A;  . CARDIAC CATHETERIZATION N/A 04/20/2016   Procedure: Coronary Stent Intervention;  Surgeon:  Marykay Lex, MD;  Location: [facility] INVASIVE CV LAB;  Service: Cardiovascular;  Laterality: N/A;  . COLON SURGERY    . LEFT HEART CATHETERIZATION WITH CORONARY ANGIOGRAM N/A 03/08/2013   Procedure: LEFT HEART CATHETERIZATION WITH CORONARY ANGIOGRAM;  Surgeon: Micheline Chapman, MD;  Location: [facility] CATH LAB;  Service: Cardiovascular;  Laterality: N/A;  . LIVER SURGERY    . SPLENECTOMY          Home Medications    Prior to Admission medications   Medication Sig Start Date End Date Taking? Authorizing Provider  aspirin 81 MG EC tablet Take 1 tablet (81 mg total) by mouth daily. 05/24/16   Jodelle Gross, NP  nitroGLYCERIN (NITROSTAT) 0.4 MG SL tablet Place 1 tablet (0.4 mg total) under the tongue every 5 (five) minutes x 3 doses as needed for chest pain. 04/29/16   Jodelle Gross, NP    Family History Family History  Problem Relation Age of Onset  . Heart attack Mother   . Diabetes Mother   . Stroke Mother   . Heart attack Father   . Diabetes Father     Social History Social History   Tobacco Use  . Smoking status: Current Every Day Smoker    Packs/day: 2.00    Years: 45.00    Pack years: 90.00    Types: Cigarettes  . Smokeless tobacco: Never Used  Substance Use Topics  . Alcohol use: Yes  Alcohol/week: 7.2 oz    Types: 12 Cans of beer per week    Comment: daily 18 beers (reports he drank this amount yesterday)   . Drug use: No     Allergies   Patient has no known allergies.   Review of Systems Review of Systems ROS: Statement: All systems negative except as marked or noted in the HPI; Constitutional: Negative for fever and chills. ; ; Eyes: Negative for eye pain, redness and discharge. ; ; ENMT: Negative for ear pain, hoarseness, nasal congestion, sinus pressure and sore throat. ; ; Cardiovascular: Negative for chest pain, palpitations, diaphoresis, dyspnea and peripheral edema. ; ; Respiratory: Negative for cough, wheezing and stridor. ; ; Gastrointestinal:  Negative for nausea, vomiting, diarrhea, abdominal pain, blood in stool, hematemesis, jaundice and rectal bleeding. . ; ; Genitourinary: Negative for dysuria, flank pain and hematuria. ; ; Musculoskeletal: Negative for back pain and neck pain. Negative for swelling and trauma.; ; Skin: Negative for pruritus, rash, abrasions, blisters, bruising and skin lesion.; ; Neuro: Negative for headache, lightheadedness and neck stiffness. Negative for weakness, altered level of consciousness, altered mental status, extremity weakness, paresthesias, involuntary movement, seizure and syncope.; Psych:  +SI, no SA, no HI, no hallucinations.      Physical Exam Updated Vital Signs BP (!) 150/105 (BP Location: Right Arm)   Pulse (!) 137   Temp 98.3 F (36.8 C) (Oral)   Resp 20   Wt 68 kg (150 lb)   SpO2 96%   BMI 20.92 kg/m   Physical Exam 2120: Physical examination:  Nursing notes reviewed; Vital signs and O2 SAT reviewed;  Constitutional: Well developed, Well nourished, Well hydrated, In no acute distress; Head:  Normocephalic, atraumatic; Eyes: EOMI, PERRL, No scleral icterus; ENMT: Mouth and pharynx normal, Mucous membranes moist; Neck: Supple, Full range of motion; Cardiovascular: Regular rate and rhythm; Respiratory: Breath sounds clear, No wheezes.  Speaking full sentences with ease, Normal respiratory effort/excursion; Chest: No deformity, Movement normal; Abdomen: Nondistended; Extremities: No deformity.; Neuro: AA&Ox3, Major CN grossly intact.  Speech clear. No gross focal motor deficits in extremities. Climbs on and off stretcher easily by himself. Gait steady.; Skin: Color normal, Warm, Dry.; Psych:  Affect flat.    ED Treatments / Results  Labs (all labs ordered are listed, but only abnormal results are displayed)   EKG None  Radiology   Procedures Procedures (including critical care time)  Medications Ordered in ED Medications  LORazepam (ATIVAN) injection 0-4 mg (has no  administration in time range)    Or  LORazepam (ATIVAN) tablet 0-4 mg (has no administration in time range)  LORazepam (ATIVAN) injection 0-4 mg (has no administration in time range)    Or  LORazepam (ATIVAN) tablet 0-4 mg (has no administration in time range)  thiamine (VITAMIN B-1) tablet 100 mg (has no administration in time range)    Or  thiamine (B-1) injection 100 mg (has no administration in time range)     Initial Impression / Assessment and Plan / ED Course  I have reviewed the triage vital signs and the nursing notes.  Pertinent labs & imaging results that were available during my care of the patient were reviewed by me and considered in my medical decision making (see chart for details).  MDM Reviewed: previous chart, nursing note and vitals Reviewed previous: labs Interpretation: labs   Results for orders placed or performed during the hospital encounter of 07/20/17  Comprehensive metabolic panel  Result Value Ref Range   Sodium  139 135 - 145 mmol/L   Potassium 4.0 3.5 - 5.1 mmol/L   Chloride 104 101 - 111 mmol/L   CO2 23 22 - 32 mmol/L   Glucose, Bld 120 (H) 65 - 99 mg/dL   BUN 12 6 - 20 mg/dL   Creatinine, Ser 1.61 0.61 - 1.24 mg/dL   Calcium 9.4 8.9 - 09.6 mg/dL   Total Protein 7.4 6.5 - 8.1 g/dL   Albumin 4.0 3.5 - 5.0 g/dL   AST 18 15 - 41 U/L   ALT 10 (L) 17 - 63 U/L   Alkaline Phosphatase 75 38 - 126 U/L   Total Bilirubin 0.3 0.3 - 1.2 mg/dL   GFR calc non Af Amer >60 >60 mL/min   GFR calc Af Amer >60 >60 mL/min   Anion gap 12 5 - 15  Ethanol  Result Value Ref Range   Alcohol, Ethyl (B) 30 (H) <10 mg/dL  Salicylate level  Result Value Ref Range   Salicylate Lvl <7.0 2.8 - 30.0 mg/dL  Acetaminophen level  Result Value Ref Range   Acetaminophen (Tylenol), Serum <10 (L) 10 - 30 ug/mL  cbc  Result Value Ref Range   WBC 8.9 4.0 - 10.5 K/uL   RBC 5.22 4.22 - 5.81 MIL/uL   Hemoglobin 14.9 13.0 - 17.0 g/dL   HCT 04.5 40.9 - 81.1 %   MCV 86.2 78.0 -  100.0 fL   MCH 28.5 26.0 - 34.0 pg   MCHC 33.1 30.0 - 36.0 g/dL   RDW 91.4 78.2 - 95.6 %   Platelets 161 150 - 400 K/uL  Rapid urine drug screen (hospital performed)  Result Value Ref Range   Opiates NONE DETECTED NONE DETECTED   Cocaine NONE DETECTED NONE DETECTED   Benzodiazepines NONE DETECTED NONE DETECTED   Amphetamines NONE DETECTED NONE DETECTED   Tetrahydrocannabinol NONE DETECTED NONE DETECTED   Barbiturates NONE DETECTED NONE DETECTED    2245:  Will have TTS evaluate. Holding orders with CIWA placed.     Final Clinical Impressions(s) / ED Diagnoses   Final diagnoses:  Suicidal ideation    ED Discharge Orders    None       Samuel Jester, DO 07/20/17 2248

## 2017-07-20 NOTE — ED Notes (Signed)
CIWA Score: 6.

## 2017-07-20 NOTE — BH Assessment (Signed)
Faxed clinical information to the following facilities for placement:  [facility]   [address]Roselee Tayloe Ellis Patsy BaltimoreWarrick Jr, [facility], [facility], [facility] Triage Specialist [phone]

## 2017-07-20 NOTE — BH Assessment (Addendum)
Tele Assessment Note   Patient Name: Craig Perry MRN: [id number] Referring Physician: Samuel Jester, DO Location of Patient: [facility], [phone] Location of Provider: Behavioral Health TTS Department  Craig Perry is an 57 y.o. divorced male who presents unaccompanied to [facility] ED after being transported voluntarily by Patent examiner. Pt reports he has a history of depression and has felt severely depressed for the past six months. He reports today he intended to kill himself by cutting his throat with a knife but his girlfriend intervened and called law enforcement. He continues to verbalize suicidal ideation. He reports one previous suicide attempt in 1988 when he cut his abdomen open. Pt acknowledges symptoms including social withdrawal, loss of interest in usual pleasures, fatigue, irritability, decreased concentration and feelings of guilt and hopelessness. He reports hearing voices when he is alone telling him to hurt people. Pt reports thoughts of hurting "a couple of people" he would not identify but has no current plan or intent. Pt states he has a history of being arrested for assault in the distant past. He says he does have access to firearms.  Pt reports he drinks 24-36 cans of beer daily. He says the longest he has gone without drinking is two days. He reports a history of blackouts and alcohol withdrawal seizures. He denies other substance use. Pt's blood alcohol level is 30 and urine drug screen is negative. Pt has a history of DUIs in the past.  Pt identifies his primary stressor is unemployment. 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 He says he was living with his girlfriend but now has no place to stay. He says he has no family or friends who are supportive. Pt says he has no children. He denies history of abuse. He says he has sustained several serious injurious in his life, including head injuries.  Pt was evaluated at [facility] approximately one  month ago for suicidal ideation and alcohol use. He was psychiatrically cleared and discharge home but says he never followed up with outpatient referrals. Pt says he has been psychiatrically hospitalized in 2003 and 2006 at [facility].   Pt is dressed in hospital scrubs, alert and oriented x4. Pt speaks in a mumbled tone, at moderate volume and normal pace. Motor behavior appears normal. Eye contact is good. Pt's mood is depressed and anxious; affect is congruent with mood. Thought process is coherent and relevant. There is no obvious indication Pt is currently responding to internal stimuli or experiencing delusional thought content. Pt was polite and cooperative throughout assessment. He says he is willing to sign voluntarily into a psychiatric facility.  .  Diagnosis:  F33.3 Major Depressive Disorder, Recurrent, Severe With Psychotic Features F10.20 Alcohol Use Disorder, Severe  Past Medical History:  Past Medical History:  Diagnosis Date  . Alcohol abuse   . CAD (coronary artery disease)    a. 2014 cath with non obst CAD.    b. 04/2016: inferior STEMI s/p DES to RCA. 70% LAD wtih no intervention  . Hypertension   . Myocardial infarction (HCC)   . Pneumothorax   . Stroke [facility])     Past Surgical History:  Procedure Laterality Date  . ABDOMINAL SURGERY    . CARDIAC CATHETERIZATION N/A 04/20/2016   Procedure: Left Heart Cath and Coronary Angiography;  Surgeon: Marykay Lex, MD;  Location: [facility] INVASIVE CV LAB;  Service: Cardiovascular;  Laterality: N/A;  . CARDIAC CATHETERIZATION N/A 04/20/2016   Procedure: Coronary Stent  Intervention;  Surgeon: Marykay Lex, MD;  [facility] INVASIVE CV LAB;  Service: Cardiovascular;  Laterality: N/A;  . COLON SURGERY    . LEFT HEART CATHETERIZATION WITH CORONARY ANGIOGRAM N/A 03/08/2013   Procedure: LEFT HEART CATHETERIZATION WITH CORONARY ANGIOGRAM;  Surgeon: Micheline Chapman, MD;  Location[facility] CATH LAB;  Service: Cardiovascular;  Laterality:  N/A;  . LIVER SURGERY    . SPLENECTOMY      Family History:  Family History  Problem Relation Age of Onset  . Heart attack Mother   . Diabetes Mother   . Stroke Mother   . Heart attack Father   . Diabetes Father     Social History:  reports that he has been smoking cigarettes.  He has a 90.00 pack-year smoking history. He has never used smokeless tobacco. He reports that he drinks about 7.2 oz of alcohol per week. He reports that he does not use drugs.  Additional Social History:  Alcohol / Drug Use Pain Medications: None Prescriptions: None Over the Counter: None History of alcohol / drug use?: Yes Longest period of sobriety (when/how long): "Two days" Negative Consequences of Use: Financial, Personal relationships, Work / Programmer, multimedia Withdrawal Symptoms: Seizures, Sweats, Blackouts, Tremors, Nausea / Vomiting Onset of Seizures: Unknown Date of most recent seizure: 1 1/2 years ago Substance #1 Name of Substance 1: ALCOHOL 1 - Age of First Use: 5 1 - Amount (size/oz): 24-36 cans of beer 1 - Frequency: Daily 1 - Duration: Ongoing for years 1 - Last Use / Amount: 07/20/17 Substance #2 Name of Substance 2: NICOTINE/CIGARETTES 2 - Age of First Use: 5 2 - Amount (size/oz): 2 PACKS 2 - Frequency: DAILY 2 - Duration: ONGOING 2 - Last Use / Amount: TODAY  CIWA: CIWA-Ar BP: (!) 150/105 Pulse Rate: (!) 137 Nausea and Vomiting: no nausea and no vomiting Tactile Disturbances: very mild itching, pins and needles, burning or numbness Tremor: two Auditory Disturbances: very mild harshness or ability to frighten Paroxysmal Sweats: no sweat visible Visual Disturbances: not present Anxiety: mildly anxious Headache, Fullness in Head: very mild Agitation: normal activity Orientation and Clouding of Sensorium: oriented and can do serial additions CIWA-Ar Total: 6 COWS:    Allergies: No Known Allergies  Home Medications:  (Not in a hospital admission)  OB/GYN Status:  No LMP for  male patient.  General Assessment Data Location of Assessment: [facility] TTS Assessment: In system Is this a Tele or Face-to-Face Assessment?: Tele Assessment Is this an Initial Assessment or a Re-assessment for this encounter?: Initial Assessment Marital status: Divorced [place] name: NA Is patient pregnant?: No Pregnancy Status: No Living Arrangements: Other (Comment)(Homeless) Can pt return to current living arrangement?: Yes Admission Status: Voluntary Is patient capable of signing voluntary admission?: Yes Referral Source: Other(Law enforcement) Insurance type: Self-pay     Crisis Care Plan Living Arrangements: Other (Comment)(Homeless) Legal Guardian: Other:(Self) Name of Psychiatrist: NONE Name of Therapist: NONE  Education Status Is patient currently in school?: No Is the patient employed, unemployed or receiving disability?: Unemployed  Risk to self with the past 6 months Suicidal Ideation: Yes-Currently Present Has patient been a risk to self within the past 6 months prior to admission? : Yes Suicidal Intent: Yes-Currently Present Has patient had any suicidal intent within the past 6 months prior to admission? : Yes Is patient at risk for suicide?: Yes Suicidal Plan?: Yes-Currently Present Has patient had any suicidal plan within the past 6 months prior to admission? : Yes Specify Current Suicidal  Plan: Cut his throat with a knife Access to Means: Yes Specify Access to Suicidal Means: Pt reports he had knife in hand today What has been your use of drugs/alcohol within the last 12 months?: Pt reports daily alcohol use Previous Attempts/Gestures: Yes How many times?: 1(1988 cut his abdomen open) Other Self Harm Risks: None identified Triggers for Past Attempts: Unknown Intentional Self Injurious Behavior: None Family Suicide History: Unknown Recent stressful life event(s): Financial Problems, Job Loss, Other (Comment)(Homeless) Persecutory voices/beliefs?:  Yes Depression: Yes Depression Symptoms: Despondent, Insomnia, Isolating, Fatigue, Guilt, Loss of interest in usual pleasures, Feeling worthless/self pity, Feeling angry/irritable Substance abuse history and/or treatment for substance abuse?: Yes Suicide prevention information given to non-admitted patients: Not applicable  Risk to Others within the past 6 months Homicidal Ideation: Yes-Currently Present Does patient have any lifetime risk of violence toward others beyond the six months prior to admission? : Yes (comment) Thoughts of Harm to Others: Yes-Currently Present Comment - Thoughts of Harm to Others: Pt reports there are people he would like to hurt Current Homicidal Intent: No Current Homicidal Plan: No Access to Homicidal Means: Yes Describe Access to Homicidal Means: Pt reports access to firearms Identified Victim: Pt would not specify History of harm to others?: Yes Assessment of Violence: In distant past Violent Behavior Description: Pt reports a history of assault Does patient have access to weapons?: Yes (Comment) Criminal Charges Pending?: No Does patient have a court date: No Is patient on probation?: No  Psychosis Hallucinations: Auditory(reports hearing voices telling him to hurt someone) Delusions: None noted  Mental Status Report Appearance/Hygiene: Disheveled Eye Contact: Good Motor Activity: Unremarkable Speech: Logical/coherent Level of Consciousness: Alert Mood: Depressed Affect: Depressed Anxiety Level: Minimal Thought Processes: Coherent, Relevant Judgement: Impaired Orientation: Person, Place, Time, Situation Obsessive Compulsive Thoughts/Behaviors: None  Cognitive Functioning Concentration: Decreased Memory: Recent Intact, Remote Intact Is patient IDD: No Is patient DD?: No Insight: Fair Impulse Control: Fair Appetite: Fair Have you had any weight changes? : No Change Sleep: No Change Total Hours of Sleep: 7 Vegetative Symptoms:  None  ADLScreening [facility] Assessment Services) Patient's cognitive ability adequate to safely complete daily activities?: Yes Patient able to express need for assistance with ADLs?: Yes Independently performs ADLs?: Yes (appropriate for developmental age)  Prior Inpatient Therapy Prior Inpatient Therapy: Yes Prior Therapy Dates: 2003, 2006 Prior Therapy Facilty/Provider(s): [facility] Reason for Treatment: MDD  Prior Outpatient Therapy Prior Outpatient Therapy: No Does patient have an ACCT team?: No Does patient have Intensive In-House Services?  : No Does patient have [facility] services? : No Does patient have P4CC services?: No  ADL Screening (condition at time of admission) Patient's cognitive ability adequate to safely complete daily activities?: Yes Is the patient deaf or have difficulty hearing?: No Does the patient have difficulty seeing, even when wearing glasses/contacts?: No Does the patient have difficulty concentrating, remembering, or making decisions?: No Patient able to express need for assistance with ADLs?: Yes Does the patient have difficulty dressing or bathing?: No Independently performs ADLs?: Yes (appropriate for developmental age) Weakness of Legs: None Weakness of Arms/Hands: None  Home Assistive Devices/Equipment Home Assistive Devices/Equipment: None    Abuse/Neglect Assessment (Assessment to be complete while patient is alone) Abuse/Neglect Assessment Can Be Completed: Yes Physical Abuse: Denies Verbal Abuse: Denies Sexual Abuse: Denies Exploitation of patient/patient's resources: Denies Self-Neglect: Denies     Merchant navy officer (For Healthcare) Does Patient Have a Medical Advance Directive?: No Would patient like information on creating a medical advance directive?:  No - Patient declined          Disposition: Binnie RailJoAnn Glover, [facility], confirmed adult unit is currently at capacity. Gave clinical report to Nira ConnJason Berry, NP who said Pt  meets criteria for inpatient psychiatric treatment. TTS will contact other facilities for placement. Notified Dr. Glynn OctaveStephen Rancour and Norva KarvonenYvette Wall, RN of recommendation.  Disposition Initial Assessment Completed for this Encounter: Yes  This service was provided via telemedicine using a 2-way, interactive audio and video technology.  Names of all persons participating in this telemedicine service and their role in this encounter. Name: Era Skeenhomas W Vachon Role: Patient  Name: Shela CommonsFord Adrian Dinovo Jr, WisconsinLPC Role: TTS counselor         Harlin RainFord Ellis Patsy BaltimoreWarrick Jr, [facility], [facility], [facility] Triage Specialist [phone]  Pamalee LeydenWarrick Jr, Silviano Neuser Ellis 07/20/2017 11:09 PM

## 2017-07-20 NOTE — ED Notes (Signed)
Pt wanded by security. 

## 2017-07-20 NOTE — ED Notes (Signed)
Pt wanded by security prior to triage, placed pt's belongings in locked and labeled locker. Security notified to wand pt again.

## 2017-07-20 NOTE — ED Triage Notes (Addendum)
Pt brought in by [employer], pt states he is having suicidal thoughts with a plan to cut his throat, pt admits to drinking today stating "I drink everyday"

## 2017-07-21 ENCOUNTER — Encounter (HOSPITAL_COMMUNITY): Payer: Self-pay | Admitting: *Deleted

## 2017-07-21 ENCOUNTER — Inpatient Hospital Stay (HOSPITAL_COMMUNITY)
Admission: AD | Admit: 2017-07-21 | Discharge: 2017-07-23 | DRG: 885 | Disposition: A | Discharge status: Home / Self Care | Payer: No Typology Code available for payment source | Source: Intra-hospital | Attending: Psychiatry | Admitting: Psychiatry

## 2017-07-21 ENCOUNTER — Other Ambulatory Visit: Payer: Self-pay

## 2017-07-21 DIAGNOSIS — F332 Major depressive disorder, recurrent severe without psychotic features: Principal | ICD-10-CM | POA: Diagnosis present

## 2017-07-21 DIAGNOSIS — Z79899 Other long term (current) drug therapy: Secondary | ICD-10-CM | POA: Diagnosis not present

## 2017-07-21 DIAGNOSIS — Z7982 Long term (current) use of aspirin: Secondary | ICD-10-CM

## 2017-07-21 DIAGNOSIS — Z8673 Personal history of transient ischemic attack (TIA), and cerebral infarction without residual deficits: Secondary | ICD-10-CM | POA: Diagnosis not present

## 2017-07-21 DIAGNOSIS — F1020 Alcohol dependence, uncomplicated: Secondary | ICD-10-CM | POA: Diagnosis present

## 2017-07-21 DIAGNOSIS — R45851 Suicidal ideations: Secondary | ICD-10-CM | POA: Diagnosis present

## 2017-07-21 DIAGNOSIS — I5042 Chronic combined systolic (congestive) and diastolic (congestive) heart failure: Secondary | ICD-10-CM | POA: Diagnosis present

## 2017-07-21 DIAGNOSIS — Z955 Presence of coronary angioplasty implant and graft: Secondary | ICD-10-CM | POA: Diagnosis not present

## 2017-07-21 DIAGNOSIS — I426 Alcoholic cardiomyopathy: Secondary | ICD-10-CM | POA: Diagnosis present

## 2017-07-21 DIAGNOSIS — R450 Nervousness: Secondary | ICD-10-CM | POA: Diagnosis not present

## 2017-07-21 DIAGNOSIS — I2510 Atherosclerotic heart disease of native coronary artery without angina pectoris: Secondary | ICD-10-CM | POA: Diagnosis present

## 2017-07-21 DIAGNOSIS — Z8249 Family history of ischemic heart disease and other diseases of the circulatory system: Secondary | ICD-10-CM

## 2017-07-21 DIAGNOSIS — F17210 Nicotine dependence, cigarettes, uncomplicated: Secondary | ICD-10-CM | POA: Diagnosis not present

## 2017-07-21 DIAGNOSIS — Y901 Blood alcohol level of 20-39 mg/100 ml: Secondary | ICD-10-CM | POA: Diagnosis not present

## 2017-07-21 DIAGNOSIS — F419 Anxiety disorder, unspecified: Secondary | ICD-10-CM | POA: Diagnosis not present

## 2017-07-21 DIAGNOSIS — Z9081 Acquired absence of spleen: Secondary | ICD-10-CM

## 2017-07-21 DIAGNOSIS — Z833 Family history of diabetes mellitus: Secondary | ICD-10-CM

## 2017-07-21 DIAGNOSIS — Z630 Problems in relationship with spouse or partner: Secondary | ICD-10-CM | POA: Diagnosis not present

## 2017-07-21 DIAGNOSIS — Z823 Family history of stroke: Secondary | ICD-10-CM | POA: Diagnosis not present

## 2017-07-21 DIAGNOSIS — I252 Old myocardial infarction: Secondary | ICD-10-CM

## 2017-07-21 DIAGNOSIS — I110 Hypertensive heart disease with heart failure: Secondary | ICD-10-CM | POA: Diagnosis present

## 2017-07-21 DIAGNOSIS — Z6379 Other stressful life events affecting family and household: Secondary | ICD-10-CM | POA: Diagnosis not present

## 2017-07-21 DIAGNOSIS — G4700 Insomnia, unspecified: Secondary | ICD-10-CM | POA: Diagnosis present

## 2017-07-21 DIAGNOSIS — Z811 Family history of alcohol abuse and dependence: Secondary | ICD-10-CM

## 2017-07-21 MED ORDER — ACETAMINOPHEN 325 MG PO TABS: 650.0000 mg | ORAL_TABLET | Freq: Four times a day (QID) | ORAL | Status: DC | PRN

## 2017-07-21 MED ORDER — ONDANSETRON 4 MG PO TBDP: 4.0000 mg | ORAL_TABLET | Freq: Four times a day (QID) | ORAL | Status: DC | PRN

## 2017-07-21 MED ORDER — LOPERAMIDE HCL 2 MG PO CAPS: 2.0000 mg | ORAL_CAPSULE | ORAL | Status: DC | PRN

## 2017-07-21 MED ORDER — CHLORDIAZEPOXIDE HCL 25 MG PO CAPS: 25.0000 mg | ORAL_CAPSULE | Freq: Every day | ORAL | Status: DC

## 2017-07-21 MED ORDER — TRAZODONE HCL 50 MG PO TABS
50.0000 mg | ORAL_TABLET | Freq: Every evening | ORAL | Status: DC | PRN
  Filled 2017-07-21: qty 7

## 2017-07-21 MED ORDER — CHLORDIAZEPOXIDE HCL 25 MG PO CAPS: 25.0000 mg | ORAL_CAPSULE | ORAL | Status: DC

## 2017-07-21 MED ORDER — CHLORDIAZEPOXIDE HCL 25 MG PO CAPS
25.0000 mg | ORAL_CAPSULE | Freq: Three times a day (TID) | ORAL | Status: DC
  Administered 2017-07-22 – 2017-07-23 (×2): 25 mg via ORAL
  Filled 2017-07-21 (×2): qty 1

## 2017-07-21 MED ORDER — ALUM & MAG HYDROXIDE-SIMETH 200-200-20 MG/5ML PO SUSP: 30.0000 mL | ORAL | Status: DC | PRN

## 2017-07-21 MED ORDER — MAGNESIUM HYDROXIDE 400 MG/5ML PO SUSP: 30.0000 mL | Freq: Every day | ORAL | Status: DC | PRN

## 2017-07-21 MED ORDER — ADULT MULTIVITAMIN W/MINERALS CH
1.0000 | ORAL_TABLET | Freq: Every day | ORAL | Status: DC
  Administered 2017-07-22 – 2017-07-23 (×2): 1 via ORAL
  Filled 2017-07-21 (×4): qty 1

## 2017-07-21 MED ORDER — CHLORDIAZEPOXIDE HCL 25 MG PO CAPS: 25.0000 mg | ORAL_CAPSULE | Freq: Four times a day (QID) | ORAL | Status: DC | PRN

## 2017-07-21 MED ORDER — HYDROXYZINE HCL 25 MG PO TABS: 25.0000 mg | ORAL_TABLET | Freq: Four times a day (QID) | ORAL | Status: DC | PRN

## 2017-07-21 MED ORDER — VITAMIN B-1 100 MG PO TABS
100.0000 mg | ORAL_TABLET | Freq: Every day | ORAL | Status: DC
  Administered 2017-07-22 – 2017-07-23 (×2): 100 mg via ORAL
  Filled 2017-07-21 (×3): qty 1

## 2017-07-21 MED ORDER — CHLORDIAZEPOXIDE HCL 25 MG PO CAPS
25.0000 mg | ORAL_CAPSULE | Freq: Four times a day (QID) | ORAL | Status: AC
  Administered 2017-07-21 – 2017-07-22 (×4): 25 mg via ORAL
  Filled 2017-07-21 (×4): qty 1

## 2017-07-21 NOTE — Progress Notes (Signed)
Craig Perry is a 57 year old male pt admitted on voluntary basis. On admission he spoke about on-going conflict with girlfriend and spoke about how he was feeling suicidal . He denies any SI on admission and is able to contract for safety while in the hospital. He reports recent MI approximately a week ago and spoke about how he was drinking heavily prior to that. He reports being sober a week until last night. Craig Perry does have some tremors on admission and spoke about going through withdrawal in the past. He reports that he was prescribed some medications but reports that he has no money to be able to afford them. He spoke about a recent job loss and financial difficulties as added stress. He reports that he was living with his girlfriend but reports that he is unable to go back there and reports that he is unsure where he will go once he is discharged. Craig Perry was oriented to the unit and safety maintained.

## 2017-07-21 NOTE — Progress Notes (Signed)
D    Pt is irritable and depressed   He reports some mild to moderate withdrawal symptoms   His interactions are minimal  His behavior is appropriate A   Verbal support given   Medications administered and effectiveness monitored   Q 15 min checks  R   Pt is safe at present time

## 2017-07-21 NOTE — Tx Team (Signed)
Initial Treatment Plan 07/21/2017 4:17 PM Era Skeenhomas W Rosamilia ZOX:[id number]    PATIENT STRESSORS: Financial difficulties Health problems Marital or family conflict Medication change or noncompliance Occupational concerns Substance abuse   PATIENT STRENGTHS: Ability for insight Average or above average intelligence Capable of independent living General fund of knowledge   PATIENT IDENTIFIED PROBLEMS: Depression Suicidal thoughts Alcohol abuse "Help with alcohol and coping skills"                     DISCHARGE CRITERIA:  Ability to meet basic life and health needs Improved stabilization in mood, thinking, and/or behavior Verbal commitment to aftercare and medication compliance Withdrawal symptoms are absent or subacute and managed without 24-hour nursing intervention  PRELIMINARY DISCHARGE PLAN: Attend aftercare/continuing care group  PATIENT/FAMILY INVOLVEMENT: This treatment plan has been presented to and reviewed with the patient, Era Skeenhomas W Hogans, and/or family member, .  The patient and family have been given the opportunity to ask questions and make suggestions.  Virlee Stroschein, [place], [place] 07/21/2017, 4:17 PM

## 2017-07-21 NOTE — ED Provider Notes (Addendum)
Sleeping comfortably   Doug SouJacubowitz, Nycholas Rayner, MD 07/21/17 0704 1:15 PM patient is alert ambulates without difficulty denies complaint, pleasant cooperative.  He is cleared medically for transfer to behavioral health hospital Results for orders placed or performed during the hospital encounter of 07/20/17  Comprehensive metabolic panel  Result Value Ref Range   Sodium 139 135 - 145 mmol/L   Potassium 4.0 3.5 - 5.1 mmol/L   Chloride 104 101 - 111 mmol/L   CO2 23 22 - 32 mmol/L   Glucose, Bld 120 (H) 65 - 99 mg/dL   BUN 12 6 - 20 mg/dL   Creatinine, Ser 8.461.02 0.61 - 1.24 mg/dL   Calcium 9.4 8.9 - 96.210.3 mg/dL   Total Protein 7.4 6.5 - 8.1 g/dL   Albumin 4.0 3.5 - 5.0 g/dL   AST 18 15 - 41 U/L   ALT 10 (L) 17 - 63 U/L   Alkaline Phosphatase 75 38 - 126 U/L   Total Bilirubin 0.3 0.3 - 1.2 mg/dL   GFR calc non Af Amer >60 >60 mL/min   GFR calc Af Amer >60 >60 mL/min   Anion gap 12 5 - 15  Ethanol  Result Value Ref Range   Alcohol, Ethyl (B) 30 (H) <10 mg/dL  Salicylate level  Result Value Ref Range   Salicylate Lvl <7.0 2.8 - 30.0 mg/dL  Acetaminophen level  Result Value Ref Range   Acetaminophen (Tylenol), Serum <10 (L) 10 - 30 ug/mL  cbc  Result Value Ref Range   WBC 8.9 4.0 - 10.5 K/uL   RBC 5.22 4.22 - 5.81 MIL/uL   Hemoglobin 14.9 13.0 - 17.0 g/dL   HCT 95.245.0 84.139.0 - 32.452.0 %   MCV 86.2 78.0 - 100.0 fL   MCH 28.5 26.0 - 34.0 pg   MCHC 33.1 30.0 - 36.0 g/dL   RDW 40.114.9 02.711.5 - 25.315.5 %   Platelets 161 150 - 400 K/uL  Rapid urine drug screen (hospital performed)  Result Value Ref Range   Opiates NONE DETECTED NONE DETECTED   Cocaine NONE DETECTED NONE DETECTED   Benzodiazepines NONE DETECTED NONE DETECTED   Amphetamines NONE DETECTED NONE DETECTED   Tetrahydrocannabinol NONE DETECTED NONE DETECTED   Barbiturates NONE DETECTED NONE DETECTED   No results found.   Doug SouJacubowitz, Jennesis Ramaswamy, MD 07/21/17 1321
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
## 2017-07-22 DIAGNOSIS — G4700 Insomnia, unspecified: Secondary | ICD-10-CM

## 2017-07-22 DIAGNOSIS — Y901 Blood alcohol level of 20-39 mg/100 ml: Secondary | ICD-10-CM

## 2017-07-22 DIAGNOSIS — Z6379 Other stressful life events affecting family and household: Secondary | ICD-10-CM

## 2017-07-22 DIAGNOSIS — F17210 Nicotine dependence, cigarettes, uncomplicated: Secondary | ICD-10-CM

## 2017-07-22 DIAGNOSIS — R450 Nervousness: Secondary | ICD-10-CM

## 2017-07-22 DIAGNOSIS — F419 Anxiety disorder, unspecified: Secondary | ICD-10-CM

## 2017-07-22 DIAGNOSIS — Z811 Family history of alcohol abuse and dependence: Secondary | ICD-10-CM

## 2017-07-22 DIAGNOSIS — F332 Major depressive disorder, recurrent severe without psychotic features: Principal | ICD-10-CM

## 2017-07-22 DIAGNOSIS — Z630 Problems in relationship with spouse or partner: Secondary | ICD-10-CM

## 2017-07-22 DIAGNOSIS — F1020 Alcohol dependence, uncomplicated: Secondary | ICD-10-CM

## 2017-07-22 LAB — LIPID PANEL
CHOL/HDL RATIO: 4.5 ratio
CHOLESTEROL: 204 mg/dL — AB (ref 0–200)
HDL: 45 mg/dL (ref >40)
LDL Cholesterol: 126 mg/dL — ABNORMAL HIGH (ref 0–99)
TRIGLYCERIDES: 164 mg/dL — AB (ref <150)
VLDL: 33 mg/dL (ref 0–40)

## 2017-07-22 LAB — TSH: TSH: 3.324 u[IU]/mL (ref 0.350–4.500)

## 2017-07-22 MED ORDER — NALTREXONE HCL 50 MG PO TABS
50.0000 mg | ORAL_TABLET | Freq: Every day | ORAL | Status: DC
  Administered 2017-07-22 – 2017-07-23 (×2): 50 mg via ORAL
  Filled 2017-07-22 (×4): qty 1

## 2017-07-22 MED ORDER — NITROGLYCERIN 0.4 MG SL SUBL: 0.4000 mg | SUBLINGUAL_TABLET | SUBLINGUAL | Status: DC | PRN

## 2017-07-22 MED ORDER — ASPIRIN EC 81 MG PO TBEC
81.0000 mg | DELAYED_RELEASE_TABLET | Freq: Every day | ORAL | Status: DC
  Administered 2017-07-22 – 2017-07-23 (×2): 81 mg via ORAL
  Filled 2017-07-22 (×4): qty 1

## 2017-07-22 NOTE — [facility] Suicide Risk Assessment (Signed)
[facility] INPATIENT:  Family/Significant Other Suicide Prevention Education  Suicide Prevention Education:  Patient Refusal for Family/Significant Other Suicide Prevention Education: The patient Craig Perry has refused to provide written consent for family/significant other to be provided Family/Significant Other Suicide Prevention Education during admission and/or prior to discharge.  Physician notified.  SPE completed with patient, as patient refused to consent to family contact. SPI pamphlet provided to pt and pt was encouraged to share information with support network, ask questions, and talk about any concerns relating to SPE. Patient denies access to guns/firearms and verbalized understanding of information provided. Mobile Crisis information also provided to patient.   [name] 07/22/2017, 9:44 AM
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
## 2017-07-22 NOTE — H&P (Addendum)
Psychiatric Admission Assessment Adult  Patient Identification: Craig Perry  MRN:  [id number]  Date of Evaluation:  07/22/2017  Chief Complaint:  MDD WITH PSYCHOTIC FEATURES  ALCOHOL USE DISORDER  Principal Diagnosis: Alcohol use disorder, moderate, dependence ([place])  Diagnosis:   Patient Active Problem List   Diagnosis Date Noted  . Alcohol use disorder, moderate, dependence ([place]) [F10.20] 07/22/2017  . MDD (major depressive disorder), single episode, severe with psychotic features ([place]) [F32.3] 07/21/2017  . CAD (coronary artery disease) [I25.10] 06/16/2017  . Acute on chronic combined systolic and diastolic CHF (congestive heart failure) ([place]) [I50.43]   . Hypertension [I10]   . ST elevation myocardial infarction (STEMI) of inferior wall, initial episode of care [facility]): Type I MI [I21.19] 04/20/2016  . 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 Chest pain [R07.9] 03/07/2013  . Tobacco abuse [Z72.0] 03/07/2013  . ETOH abuse [F10.10] 03/07/2013  . Unintentional weight loss [R63.4] 03/07/2013   History of Present Illness: This is an admission assessment for this 58 year old Caucasian male with hx of alcoholism. Admitted to the [facility] from the [facility] with chief complaints of suicidal ideations going on x several weeks with plans to cut his throat. ED reports also indicated daily alcohol use. His UDS was negative of all substances & BAL at 33.  During this assessment, Craig Perry reports, "The police took me to the hospital on Sunday night. My girl-friend called them. We had gotten into an argument. She called the cops to get me out of the house. I had drank some alcohol prior to this, about 8 cans of beer, ready to go to bed when the argument started. We are trying to raise this a 53 year old girl, my niece. Then, there were drugs going in & out of the house. My girlfriend does not use drugs, but her friend do. They keep coming in & out of the house using drugs. I don't  want these drugs addicts using drugs in front this kid. So, I told her this ain't good for this kid. A lot of times, we resolve our argument, but when it involves drugs with her friends, it is a different thing. She told the cops that I was feeling suicidal. I'm an alcoholic, I drink most of the time. I drink mainly beer, no liquor. I have been to the [place] meetings in the past. I had heart attack 5 weeks ago, I cut down on my drinking as a result. I had a stent put in my heart. Ain't depressed or suicidal, never tried to take my life & I did not know no body in my family who tried to commit suicide".  Associated Signs/Symptoms:  Depression Symptoms:  Patient currently denies any symptoms of depression at this time.  (Hypo) Manic Symptoms:  Currently denies any hypomanic symptoms  Anxiety Symptoms:  Denies any symptoms of anxiety.  Psychotic Symptoms:  Denies any feelings or paranoia, delusional thinking or hearing voices or seeing things.  PTSD Symptoms: Denies any PTSD symptoms or events.  Total Time spent with patient: 1 hour  Past Psychiatric History: Alcoholism, chronic.  Is the patient at risk to self? No.  Has the patient been a risk to self in the past 6 months? No.  Has the patient been a risk to self within the distant past? No.  Is the patient a risk to others? No.  Has the patient been a risk to others in the past 6 months? No.  Has the patient been  a risk to others within the distant past? No.   Prior Inpatient Therapy: Denies Prior Outpatient Therapy: Denies.  Alcohol Screening: 1. How often do you have a drink containing alcohol?: 4 or more times a week 2. How many drinks containing alcohol do you have on a typical day when you are drinking?: 10 or more 3. How often do you have six or more drinks on one occasion?: Daily or almost daily AUDIT-C Score: 12 4. How often during the last year have you found that you were not able to stop drinking once you had started?:  Weekly 5. How often during the last year have you failed to do what was normally expected from you becasue of drinking?: Less than monthly 6. How often during the last year have you needed a first drink in the morning to get yourself going after a heavy drinking session?: Never 7. How often during the last year have you had a feeling of guilt of remorse after drinking?: Never 8. How often during the last year have you been unable to remember what happened the night before because you had been drinking?: Never 9. Have you or someone else been injured as a result of your drinking?: Yes, but not in the last year 10. Has a relative or friend or a doctor or another health worker been concerned about your drinking or suggested you cut down?: Yes, during the last year Alcohol Use Disorder Identification Test Final Score (AUDIT): 22 Intervention/Follow-up: Alcohol Education  Substance Abuse History in the last 12 months:  Yes.    Consequences of Substance Abuse: Discussed & xplained to patient. Medical Consequences:  Liver damage, Possible death by overdose Legal Consequences:  Arrests, jail time, Loss of driving privilege. Family Consequences:  Family discord, divorce and or separation.  Previous Psychotropic Medications: Denies.  Psychological Evaluations: No   Past Medical History:  Past Medical History:  Diagnosis Date  . Alcohol abuse   . CAD (coronary artery disease)    a. 2014 cath with non obst CAD.    b. 04/2016: inferior STEMI s/p DES to RCA. 70% LAD wtih no intervention  . Hypertension   . Myocardial infarction ([place])   . Pneumothorax   . Stroke [facility])     Past Surgical History:  Procedure Laterality Date  . ABDOMINAL SURGERY    . CARDIAC CATHETERIZATION N/A 04/20/2016   Procedure: Left Heart Cath and Coronary Angiography;  Surgeon: Leonie Man, MD;  Location: [place] CV LAB;  Service: Cardiovascular;  Laterality: N/A;  . CARDIAC CATHETERIZATION N/A 04/20/2016   Procedure:  Coronary Stent Intervention;  Surgeon: Leonie Man, MD;  Location: [facility] CV LAB;  Service: Cardiovascular;  Laterality: N/A;  . COLON SURGERY    . LEFT HEART CATHETERIZATION WITH CORONARY ANGIOGRAM N/A 03/08/2013   Procedure: LEFT HEART CATHETERIZATION WITH CORONARY ANGIOGRAM;  Surgeon: Blane Ohara, MD;  Location: [facility] CATH LAB;  Service: Cardiovascular;  Laterality: N/A;  . LIVER SURGERY    . SPLENECTOMY     Family History:  Family History  Problem Relation Age of Onset  . Heart attack Mother   . Diabetes Mother   . Stroke Mother   . Heart attack Father   . Diabetes Father    Family Psychiatric  History: Alcoholism: Brothers.  Tobacco Screening: Have you used any form of tobacco in the last 30 days? (Cigarettes, Smokeless Tobacco, Cigars, and/or Pipes): Yes Tobacco use, Select all that apply: 5 or more cigarettes per day Are  you interested in Tobacco Cessation Medications?: No, patient refused Counseled patient on smoking cessation including recognizing danger situations, developing coping skills and basic information about quitting provided: Refused/Declined practical counseling  Social History:  Social History   Substance and Sexual Activity  Alcohol Use Yes  . Alcohol/week: 7.2 oz  . Types: 12 Cans of beer per week   Comment: daily 18 beers (reports he drank this amount yesterday)      Social History   Substance and Sexual Activity  Drug Use No    Additional Social History: Marital status: Single Are you sexually active?: No What is your sexual orientation?: Heterosexual  Has your sexual activity been affected by drugs, alcohol, medication, or emotional stress?: No  Does patient have children?: No(No biological children) How many children?: 1 How is patient's relationship with their children?: Patient reports having a step daughter who is very close with.   Allergies:  No Known Allergies  Lab Results:  Results for orders placed or performed during the  hospital encounter of 07/21/17 (from the past 48 hour(s))  TSH     Status: None   Collection Time: 07/22/17  6:35 AM  Result Value Ref Range   TSH 3.324 0.350 - 4.500 uIU/mL    Comment: Performed by a 3rd Generation assay with a functional sensitivity of <=0.01 uIU/mL. Performed at [facility], [place] [address], [place], [place] [address]   Lipid panel     Status: Abnormal   Collection Time: 07/22/17  6:35 AM  Result Value Ref Range   Cholesterol 204 (H) 0 - 200 mg/dL   Triglycerides 164 (H) <150 mg/dL   HDL 45 >40 mg/dL   Total CHOL/HDL Ratio 4.5 RATIO   VLDL 33 0 - 40 mg/dL   LDL Cholesterol 126 (H) 0 - 99 mg/dL    Comment:        Total Cholesterol/HDL:CHD Risk Coronary Heart Disease Risk Table                     Men   Women  1/2 Average Risk   3.4   3.3  Average Risk       5.0   4.4  2 X Average Risk   9.6   7.1  3 X Average Risk  23.4   11.0        Use the calculated Patient Ratio above and the CHD Risk Table to determine the patient's CHD Risk.        ATP III CLASSIFICATION (LDL):  <100     mg/dL   Optimal  100-129  mg/dL   Near or Above                    Optimal  130-159  mg/dL   Borderline  160-189  mg/dL   High  >190     mg/dL   Very High Performed at [facility] [address], [place], [place] [address]    Blood Alcohol level:  Lab Results  Component Value Date   ETH 30 (H) 07/20/2017   ETH 81 (H) 95/63/8756   Metabolic Disorder Labs:  Lab Results  Component Value Date   HGBA1C 5.9 (H) 04/20/2016   MPG 123 04/20/2016   MPG 120 (H) 03/07/2013   No results found for: PROLACTIN Lab Results  Component Value Date   CHOL 204 (H) 07/22/2017   TRIG 164 (H) 07/22/2017   HDL 45 07/22/2017   CHOLHDL 4.5 07/22/2017  VLDL 33 07/22/2017   LDLCALC 126 (H) 07/22/2017   LDLCALC 99 06/16/2017   Current Medications: Current Facility-Administered Medications  Medication Dose Route Frequency Provider Last Rate Last Dose   . acetaminophen (TYLENOL) tablet 650 mg  650 mg Oral Q6H PRN Money, Lowry Ram, FNP      . alum & mag hydroxide-simeth (MAALOX/MYLANTA) 200-200-20 MG/5ML suspension 30 mL  30 mL Oral Q4H PRN Money, Lowry Ram, FNP      . aspirin EC tablet 81 mg  81 mg Oral Daily Nwoko, Agnes I, NP      . chlordiazePOXIDE (LIBRIUM) capsule 25 mg  25 mg Oral Q6H PRN Money, Lowry Ram, FNP      . chlordiazePOXIDE (LIBRIUM) capsule 25 mg  25 mg Oral TID Money, Lowry Ram, FNP       Followed by  . [START ON 07/23/2017] chlordiazePOXIDE (LIBRIUM) capsule 25 mg  25 mg Oral BH-qamhs Money, Lowry Ram, FNP       Followed by  . [START ON 07/25/2017] chlordiazePOXIDE (LIBRIUM) capsule 25 mg  25 mg Oral Daily Money, Lowry Ram, FNP      . hydrOXYzine (ATARAX/VISTARIL) tablet 25 mg  25 mg Oral Q6H PRN Money, Lowry Ram, FNP      . loperamide (IMODIUM) capsule 2-4 mg  2-4 mg Oral PRN Money, Lowry Ram, FNP      . magnesium hydroxide (MILK OF MAGNESIA) suspension 30 mL  30 mL Oral Daily PRN Money, Lowry Ram, FNP      . multivitamin with minerals tablet 1 tablet  1 tablet Oral Daily Money, Lowry Ram, FNP   1 tablet at 07/22/17 0801  . naltrexone (DEPADE) tablet 50 mg  50 mg Oral Daily Lindell Spar I, NP   50 mg at 07/22/17 1427  . nitroGLYCERIN (NITROSTAT) SL tablet 0.4 mg  0.4 mg Sublingual Q5 Min x 3 PRN Nwoko, Agnes I, NP      . ondansetron (ZOFRAN-ODT) disintegrating tablet 4 mg  4 mg Oral Q6H PRN Money, Darnelle Maffucci B, FNP      . thiamine (VITAMIN B-1) tablet 100 mg  100 mg Oral Daily Money, Travis B, FNP   100 mg at 07/22/17 0801  . traZODone (DESYREL) tablet 50 mg  50 mg Oral QHS PRN Money, Lowry Ram, FNP       PTA Medications: Medications Prior to Admission  Medication Sig Dispense Refill Last Dose  . aspirin 81 MG EC tablet Take 1 tablet (81 mg total) by mouth daily. (Patient not taking: Reported on 07/20/2017) 30 tablet 6 Not Taking at Unknown time  . nitroGLYCERIN (NITROSTAT) 0.4 MG SL tablet Place 1 tablet (0.4 mg total) under the tongue  every 5 (five) minutes x 3 doses as needed for chest pain. 25 tablet 12 unknown   Musculoskeletal: Strength & Muscle Tone: within normal limits Gait & Station: normal Patient leans: N/A  Psychiatric Specialty Exam: Physical Exam  Constitutional: He appears well-developed.  HENT:  Head: Normocephalic.  Eyes: Pupils are equal, round, and reactive to light.  Neck: Normal range of motion.  Cardiovascular:  Elevated pulse rate (106)  Respiratory: Effort normal.  GI: Soft.  Genitourinary:  Genitourinary Comments: Deferred  Musculoskeletal: Normal range of motion.  Neurological: He is alert.  Skin: Skin is warm.    Review of Systems  Constitutional: Negative for malaise/fatigue.  HENT: Negative.   Eyes: Negative.   Respiratory: Negative.   Cardiovascular: Negative.   Gastrointestinal: Negative.   Genitourinary: Negative.   Skin: Negative.  Neurological: Negative.   Endo/Heme/Allergies: Negative.   Psychiatric/Behavioral: Positive for depression, substance abuse (BAL 33) and suicidal ideas. Negative for hallucinations and memory loss. The patient is nervous/anxious and has insomnia.     Blood pressure 127/89, pulse 88, temperature 97.8 F (36.6 C), temperature source Oral, resp. rate 16, height 5' 8.5" (1.74 m), weight 67.1 kg (148 lb).Body mass index is 22.18 kg/m.  General Appearance: Casual  Eye Contact:  Good  Speech:  Clear and Coherent and Normal Rate  Volume:  Normal  Mood:  Denies any symptoms of depressions or anxiety  Affect:  Appropriate and Congruent  Thought Process:  Coherent, Linear and Descriptions of Associations: Intact  Orientation:  Full (Time, Place, and Person)  Thought Content:  Logical, denies any hallucinations, delusions or paranoia.  Suicidal Thoughts:  Denies any thoughts, plans or intent  Homicidal Thoughts:  Denies  Memory:  Immediate;   Good Recent;   Good Remote;   Good  Judgement:  Good  Insight:  Present  Psychomotor Activity:  Normal   Concentration:  Concentration: Good and Attention Span: Good  Recall:  Good  Fund of Knowledge:  Good  Language:  Good  Akathisia:  No  Handed:  Right  AIMS (if indicated):     Assets:  Communication Skills Desire for Improvement  ADL's:  Intact  Cognition:  WNL  Sleep:  Number of Hours: 6.75   Treatment Plan Summary: Daily contact with patient to assess and evaluate symptoms and progress in treatment: See Md's SRA & treatment plan.  Observation Level/Precautions:  15 minute checks  Laboratory:  Per ED, UDS - negative, BAL 33  Psychotherapy: Group sessions   Medications: See MAR   Consultations: As needed   Discharge Concerns: safety, mood stability & maintaining sobriety.   Estimated LOS: 2-4 days  Other: Admit to the 300-hall.   Physician Treatment Plan for Primary Diagnosis: Alcohol use disorder, moderate, dependence ([place]) Long Term Goal(s): Improvement in symptoms so as ready for discharge  Short Term Goals: Ability to identify changes in lifestyle to reduce recurrence of condition will improve and Ability to disclose and discuss suicidal ideas  Physician Treatment Plan for Secondary Diagnosis: Principal Problem:   Alcohol use disorder, moderate, dependence ([place]) Active Problems:   MDD (major depressive disorder), single episode, severe with psychotic features ([place])  Long Term Goal(s): Improvement in symptoms so as ready for discharge  Short Term Goals: Ability to identify and develop effective coping behaviors will improve, Compliance with prescribed medications will improve and Ability to identify triggers associated with substance abuse/mental health issues will improve  I certify that inpatient services furnished can reasonably be expected to improve the patient's condition.    Lindell Spar, NP, pmhnp, fnp-bc 4/23/20192:47 PM   I have reviewed NP's Note, assessement, diagnosis and plan, and agree. I have also met with patient and completed suicide risk  assessment.  Craig Perry is a 57 y/o M with psychiatric history of MDD and alcohol use disorder and medical history of CAD and STEMI who was admitted voluntarily from [facility] ED after he was brought in by police when his girlfriend had called and reported that patient had made suicidal statements that he was going to cut his throat while intoxicated and in the context of argument with his girlfriend. Pt had BAL of 33 on arrival. Pt had similar presentation to ED on 3/20 at which time he was discharged to outpatient level of care. Pt was in agreement to be transferred to  [facility] for additional treatment and evaluation. He was started on alcohol withdrawal protocol with librium.  Upon initial presentation, pt shares, "My and my girlfriend was arguing - other people were coming in the house - and we were arguing. She told me to go in and called them to pick me, and that's when things got worse." Pt is somewhat vague about what his statements were to his girlfriend which prompted her to call emergency services. He acknowledges, "Sometimes I say things when I'm upset that I don't mean." He denies depression symptoms. He denies SI/HI/AH/VH. His sleep is good. His appetite is good. He denies symptoms of mania, OCD, and PTSD. He reports drinking daily, about 2-8 beers, and he reports that he had consumed about 8 beers on day of presentation to ED. He would like to cut back due to recent health concerns including STEMI.  Discussed with patient about treatment options. He does not take any psychotropic medications at home. He declines to be started on a medication for his mood, as he feels it is not needed. He declines for referral to substance use treatment, and instead he plans to follow up at [facility] in [place] for referral to [employer]. Pt is in agreement to trial of naltrexone to address cravings for alcohol. He will be continued on alcohol withdrawal protocol with librium at this time. He is in agreement  with the above plan, and he had no further questions, comments, or concerns.  PLAN OF CARE:   -Admit to inpatient level of care  -Alcohol use disorder             - Start naltrexone 31m po qDay             - Continue CIWA with librium  -Anxiety             - continue atarax 278mpo q6h prn anxiety  - Hx of CAD/STEMI              -Continue aspirin 8172mo qDay             - Continue nitrostat 0.4mg56m q5min52m3 PRN chest pain  -Insomnia             -Continue trazodone 50mg 57mhs prn insomnia  -Encourage participation in groups and therapeutic milieu  -Disposition planning will be ongoing    ChristMaris Berger

## 2017-07-22 NOTE — [facility] Group Notes (Signed)
LCSW Group Therapy Note 07/22/2017 2:01 PM  Type of Therapy/Topic: Group Therapy: Feelings about Diagnosis  Participation Level: Did Not Attend   Description of Group:  This group will allow patients to explore their thoughts and feelings about diagnoses they have received. Patients will be guided to explore their level of understanding and acceptance of these diagnoses. Facilitator will encourage patients to process their thoughts and feelings about the reactions of others to their diagnosis and will guide patients in identifying ways to discuss their diagnosis with significant others in their lives. This group will be process-oriented, with patients participating in exploration of their own experiences, giving and receiving support, and processing challenge from other group members.  Therapeutic Goals: 1. Patient will demonstrate understanding of diagnosis as evidenced by identifying two or more symptoms of the disorder 2. Patient will be able to express two feelings regarding the diagnosis 3. Patient will demonstrate their ability to communicate their needs through discussion and/or role play  Summary of Patient Progress:  Invited, chose not to attend.     Therapeutic Modalities:  Cognitive Behavioral Therapy Brief Therapy Feelings Identification    Leala Bryand Catalina AntiguaWilliams LCSWA Clinical Social Worker

## 2017-07-22 NOTE — [facility] Suicide Risk Assessment (Signed)
[facility]   Nursing information obtained from:    Demographic factors:    Current Mental Status:    Loss Factors:    Historical Factors:    Risk Reduction Factors:     Total Time spent with patient: 1 hour Principal Problem: Major depressive disorder, recurrent severe without psychotic features (HCC) Diagnosis:   Patient Active Problem List   Diagnosis Date Noted  . Alcohol use disorder, moderate, dependence (HCC) [F10.20] 07/22/2017  . Major depressive disorder, recurrent severe without psychotic features (HCC) [F33.2] 07/21/2017  . CAD (coronary artery disease) [I25.10] 06/16/2017  . Acute on chronic combined systolic and diastolic CHF (congestive heart failure) (HCC) [I50.43]   . Hypertension [I10]   . ST elevation myocardial infarction (STEMI) of inferior wall, initial episode of care [facility](HCC): Type I MI [I21.19] 04/20/2016  . 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 Chest pain [R07.9] 03/07/2013  . Tobacco abuse [Z72.0] 03/07/2013  . ETOH abuse [F10.10] 03/07/2013  . Unintentional weight loss [R63.4] 03/07/2013   Subjective Data:   Craig Perry is a 57 y/o M with psychiatric history of MDD and alcohol use disorder and medical history of CAD and STEMI who was [facility] after he was brought in by police when his girlfriend had called and reported that patient had made suicidal statements that he was going to cut his throat while intoxicated and in the context of argument with his girlfriend. Pt had BAL of 33 on arrival. Pt had similar presentation to ED on 3/20 at which time he was discharged to outpatient level of care. [facility] and [facility]. He was started on alcohol withdrawal protocol with librium.  Upon initial presentation, pt shares, "My and my girlfriend was arguing - other people were coming in the house - and we were arguing. She told me to  go in and called them to pick me, and that's when things got worse." Pt is somewhat vague about what his statements were to his girlfriend which prompted her to call emergency services. He acknowledges, "Sometimes I say things when I'm upset that I don't mean." He denies depression symptoms. He denies SI/HI/AH/VH. His sleep is good. His appetite is good. He denies symptoms of mania, OCD, and PTSD. He reports drinking daily, about 2-8 beers, and he reports that he had consumed about 8 beers on day of presentation to ED. He would like to cut back due to recent health concerns including STEMI.  Discussed with patient about treatment options. He does not take any psychotropic medications at home. He declines to be started on a medication for his mood, as he feels it is not needed. He declines for referral to substance use treatment, and instead he plans [facility] [place] for referral to [employer] meetings. Pt is in agreement to trial of naltrexone to address cravings for alcohol. He will be continued on alcohol withdrawal protocol with librium at this time. He is in agreement with the above plan, and he had no further questions, comments, or concerns.  Continued Clinical Symptoms:  Alcohol Use Disorder Identification Test Final Score (AUDIT): 22 The "Alcohol Use Disorders Identification Test", Guidelines for Use in Primary Care, Second Edition.  World Science writerHealth Organization [facility](WHO). Score between 0-7:  no or low risk or alcohol related problems. Score between 8-15:  moderate risk of alcohol related problems. Score between 16-19:  high risk of alcohol  related problems. Score 20 or above:  warrants further diagnostic evaluation for alcohol dependence and treatment.   CLINICAL FACTORS:   Depression:   Comorbid alcohol abuse/dependence Alcohol/Substance Abuse/Dependencies Medical Diagnoses and Treatments/Surgeries   Musculoskeletal: Strength & Muscle Tone: within normal limits Gait & Station:  normal Patient leans: N/A  Psychiatric Specialty Exam: Physical Exam  Nursing note and vitals reviewed.   Review of Systems  Constitutional: Negative for chills and fever.  Respiratory: Negative for cough and shortness of breath.   Cardiovascular: Negative for chest pain.  Gastrointestinal: Negative for abdominal pain, heartburn, nausea and vomiting.  Psychiatric/Behavioral: Positive for substance abuse. Negative for depression, hallucinations and suicidal ideas. The patient is not nervous/anxious and does not have insomnia.     Blood pressure 112/84, pulse 92, temperature 98.2 F (36.8 C), temperature source Oral, resp. rate 16, height 5' 8.5" (1.74 m), weight 67.1 kg (148 lb).Body mass index is 22.18 kg/m.  General Appearance: Casual and Disheveled  Eye Contact:  Good  Speech:  Garbled and Normal Rate  Volume:  Normal  Mood:  Euthymic  Affect:  Appropriate, Congruent and Full Range  Thought Process:  Coherent and Goal Directed  Orientation:  Full (Time, Place, and Person)  Thought Content:  Logical  Suicidal Thoughts:  No  Homicidal Thoughts:  No  Memory:  Immediate;   Fair Recent;   Fair Remote;   Fair  Judgement:  Poor  Insight:  Lacking  Psychomotor Activity:  Normal  Concentration:  Concentration: Fair  Recall:  [employer] of Knowledge:  Fair  Language:  Fair  Akathisia:  No  Handed:    AIMS (if indicated):     Assets:  Financial Resources/Insurance Housing Resilience Social Support  ADL's:  Intact  Cognition:  WNL  Sleep:  Number of Hours: 6.75      COGNITIVE FEATURES THAT CONTRIBUTE TO RISK:  None    SUICIDE RISK:   Minimal: No identifiable suicidal ideation.  Patients presenting with no risk factors but with morbid ruminations; may be classified as minimal risk based on the severity of the depressive symptoms  PLAN OF CARE:   -Admit to inpatient level of care  -Alcohol use disorder  - Start naltrexone 50mg  po qDay  - Continue CIWA with  librium  -Anxiety   - continue atarax 25mg  po q6h prn anxiety  - Hx of CAD/STEMI   -Continue aspirin 81mg  po qDay   - Continue nitrostat 0.4mg  SL q78mins x3 PRN chest pain  -Insomnia   -Continue trazodone 50mg  po qhs prn insomnia  -Encourage participation in groups and therapeutic milieu  -Disposition planning will be ongoing  I certify that inpatient services furnished can reasonably be expected to improve the patient's condition.   Micheal Likens, MD 07/22/2017, 4:09 PM

## 2017-07-22 NOTE — Progress Notes (Signed)
D: Patient observed up and visible in the milieu. Patient states he is doing "okay." "They told me to take the librium but I'm not sure if I need it. I guess I'll take it anyway to be safe." Patient's affect animated, anxious with congruent mood. Per self inventory and discussions with writer, rates depression at a 6/10, hopelessness at a 4/10 and anxiety at a 4/10. Rates sleep as fair, appetite as good, energy as normal and concentration as good.  States goal for today is "working on getting better." Denies pain, physical complaints. No withdrawal reported or observed.   A: Medicated per orders, no prns requested or required. Level III obs in place for safety. Emotional support offered and self inventory reviewed. Encouraged completion of Suicide Safety Plan and programming participation. Discussed POC with MD, SW.   R: Patient verbalizes understanding of POC. Patient denies SI/HI/AVH and remains safe on level III obs. Will continue to monitor closely and make verbal contact frequently.

## 2017-07-22 NOTE — Plan of Care (Signed)
Patient verbalizes understanding of information, education provided. 

## 2017-07-22 NOTE — Progress Notes (Signed)
Patient attended AA group meeting tonight.  

## 2017-07-22 NOTE — Progress Notes (Signed)
Recreation Therapy Notes  Animal-Assisted Activity (AAA) Program Checklist/Progress Notes Patient Eligibility Criteria Checklist & Daily Group note for Rec Tx Intervention  Date: 4.23.19 Time: 1430 Location: [address] Dayroom   AAA/T Program Assumption of Risk Form signed by Patient/ or Parent Legal Guardian   Patient is free of allergies or sever asthma   Patient reports no fear of animals   Patient reports no history of cruelty to animals   Patient understands his/her participation is voluntary   Patient washes hands before animal contact   Patient washes hands after animal contact    Education: Hand Washing, Appropriate Animal Interaction   Education Outcome: Acknowledges understanding/In group clarification offered/Needs additional education.   Clinical Observations/Feedback: Pt did not attend activity.    Craig Perry, LRT/CTRS         Craig Perry 07/22/2017 3:15 PM 

## 2017-07-22 NOTE — Progress Notes (Signed)
D    Pt is irritable and depressed   He reports some mild to moderate withdrawal symptoms   His interactions are minimal  His behavior is appropriate  He said his doctor was discharging him in the morning and he is ready to go back to work A   Verbal support given   Medications administered and effectiveness monitored   Q 15 min checks   Discussed how patient could improve his health by not working long hours and taking time off  R   Pt is safe at present time   Pt agreed and was receptive to verbal support and discussion

## 2017-07-22 NOTE — [facility] Group Notes (Signed)
[facility] Group Notes:  (Nursing/MHT/Case Management/Adjunct)  Date:  07/22/2017  Time:  8:55 AM  Type of Therapy:  Nurse Education  Participation Level:  Active  Participation Quality:  Appropriate  Affect:  Flat  Cognitive:  Appropriate  Insight:  Improving  Engagement in Group:  Limited  Modes of Intervention:  Orientation  Summary of Progress/Problems: Patient attended morning goals group. Goal today: "Make it through the day".  Kirstie MirzaJonathan C Denell Cothern 07/22/2017, 8:55 AM

## 2017-07-23 MED ORDER — NITROGLYCERIN 0.4 MG SL SUBL: 0.4000 mg | SUBLINGUAL_TABLET | SUBLINGUAL | 0 refills | Status: DC | PRN

## 2017-07-23 MED ORDER — ASPIRIN 81 MG PO TBEC: 81.0000 mg | DELAYED_RELEASE_TABLET | Freq: Every day | ORAL | 0 refills | Status: DC

## 2017-07-23 MED ORDER — TRAZODONE HCL 50 MG PO TABS
50.0000 mg | ORAL_TABLET | Freq: Every evening | ORAL | 0 refills | Status: DC | PRN
Start: 2017-07-23 — End: 2017-11-18

## 2017-07-23 MED ORDER — NALTREXONE HCL 50 MG PO TABS: 50.0000 mg | ORAL_TABLET | Freq: Every day | ORAL | 0 refills | Status: DC

## 2017-07-23 NOTE — Progress Notes (Signed)
Patient ID: Craig Perry, male   DOB: 1961/02/23, 57 y.o.   MRN: [id number]  D: Patient denies SI/HI/AVH this morning, verbalizes readiness for discharge.  Discharge orders have been placed, and currently awaiting sample meds delivery from pharmacy.  Pt is calm/cooperative with care, and denies any current concerns.    A: All meds were given this morning as ordered, Q15 minute checks maintained for safety.    R: Will continue to monitor.

## 2017-07-23 NOTE — Tx Team (Signed)
Interdisciplinary Treatment and Diagnostic Plan Update  07/23/2017 Time of Session: 9:00am Craig Perry MRN: [id number]  Principal Diagnosis: Major depressive disorder, recurrent severe without psychotic features (HCC)  Secondary Diagnoses: Principal Problem:   Major depressive disorder, recurrent severe without psychotic features (HCC) Active Problems:   Alcohol use disorder, moderate, dependence (HCC)   Current Medications:  Current Facility-Administered Medications  Medication Dose Route Frequency Provider Last Rate Last Dose  . acetaminophen (TYLENOL) tablet 650 mg  650 mg Oral Q6H PRN Money, Gerlene Burdock, FNP      . alum & mag hydroxide-simeth (MAALOX/MYLANTA) 200-200-20 MG/5ML suspension 30 mL  30 mL Oral Q4H PRN Money, Gerlene Burdock, FNP      . aspirin EC tablet 81 mg  81 mg Oral Daily Armandina Stammer I, NP   81 mg at 07/23/17 0741  . chlordiazePOXIDE (LIBRIUM) capsule 25 mg  25 mg Oral Q6H PRN Money, Gerlene Burdock, FNP      . chlordiazePOXIDE (LIBRIUM) capsule 25 mg  25 mg Oral TID Money, Gerlene Burdock, FNP   25 mg at 07/23/17 0741   Followed by  . chlordiazePOXIDE (LIBRIUM) capsule 25 mg  25 mg Oral BH-qamhs Money, Gerlene Burdock, FNP       Followed by  . [START ON 07/25/2017] chlordiazePOXIDE (LIBRIUM) capsule 25 mg  25 mg Oral Daily Money, Gerlene Burdock, FNP      . hydrOXYzine (ATARAX/VISTARIL) tablet 25 mg  25 mg Oral Q6H PRN Money, Gerlene Burdock, FNP      . loperamide (IMODIUM) capsule 2-4 mg  2-4 mg Oral PRN Money, Gerlene Burdock, FNP      . magnesium hydroxide (MILK OF MAGNESIA) suspension 30 mL  30 mL Oral Daily PRN Money, Feliz Beam B, FNP      . multivitamin with minerals tablet 1 tablet  1 tablet Oral Daily Money, Gerlene Burdock, FNP   1 tablet at 07/23/17 0740  . naltrexone (DEPADE) tablet 50 mg  50 mg Oral Daily Armandina Stammer I, NP   50 mg at 07/23/17 0741  . nitroGLYCERIN (NITROSTAT) SL tablet 0.4 mg  0.4 mg Sublingual Q5 Min x 3 PRN Nwoko, Agnes I, NP      . ondansetron (ZOFRAN-ODT) disintegrating tablet 4 mg  4  mg Oral Q6H PRN Money, Feliz Beam B, FNP      . thiamine (VITAMIN B-1) tablet 100 mg  100 mg Oral Daily Money, Travis B, FNP   100 mg at 07/23/17 0740  . traZODone (DESYREL) tablet 50 mg  50 mg Oral QHS PRN Money, Gerlene Burdock, FNP       PTA Medications: Medications Prior to Admission  Medication Sig Dispense Refill Last Dose  . aspirin 81 MG EC tablet Take 1 tablet (81 mg total) by mouth daily. (Patient not taking: Reported on 07/20/2017) 30 tablet 6 Not Taking at Unknown time  . nitroGLYCERIN (NITROSTAT) 0.4 MG SL tablet Place 1 tablet (0.4 mg total) under the tongue every 5 (five) minutes x 3 doses as needed for chest pain. 25 tablet 12 unknown    Patient Stressors: Financial difficulties Health problems Marital or family conflict Medication change or noncompliance Occupational concerns Substance abuse  Patient Strengths: Ability for insight Average or above average intelligence Capable of independent living General fund of knowledge  Treatment Modalities: Medication Management, Group therapy, Case management,  1 to 1 session with clinician, Psychoeducation, Recreational therapy.   Physician Treatment Plan for Primary Diagnosis: Major depressive disorder, recurrent severe without psychotic features (HCC) Long Term Goal(s):  Improvement in symptoms so as ready for discharge Improvement in symptoms so as ready for discharge   Short Term Goals: Ability to identify changes in lifestyle to reduce recurrence of condition will improve Ability to disclose and discuss suicidal ideas Ability to identify and develop effective coping behaviors will improve Compliance with prescribed medications will improve Ability to identify triggers associated with substance abuse/mental health issues will improve  Medication Management: Evaluate patient's response, side effects, and tolerance of medication regimen.  Therapeutic Interventions: 1 to 1 sessions, Unit Group sessions and Medication  administration.  Evaluation of Outcomes: Adequate for Discharge  Physician Treatment Plan for Secondary Diagnosis: Principal Problem:   Major depressive disorder, recurrent severe without psychotic features (HCC) Active Problems:   Alcohol use disorder, moderate, dependence (HCC)  Long Term Goal(s): Improvement in symptoms so as ready for discharge Improvement in symptoms so as ready for discharge   Short Term Goals: Ability to identify changes in lifestyle to reduce recurrence of condition will improve Ability to disclose and discuss suicidal ideas Ability to identify and develop effective coping behaviors will improve Compliance with prescribed medications will improve Ability to identify triggers associated with substance abuse/mental health issues will improve     Medication Management: Evaluate patient's response, side effects, and tolerance of medication regimen.  Therapeutic Interventions: 1 to 1 sessions, Unit Group sessions and Medication administration.  Evaluation of Outcomes: Adequate for Discharge   RN Treatment Plan for Primary Diagnosis: Major depressive disorder, recurrent severe without psychotic features (HCC) Long Term Goal(s): Knowledge of disease and therapeutic regimen to maintain health will improve  Short Term Goals: Ability to remain free from injury will improve, Ability to verbalize frustration and anger appropriately will improve, Ability to demonstrate self-control, Ability to participate in decision making will improve, Ability to verbalize feelings will improve, Ability to disclose and discuss suicidal ideas, Ability to identify and develop effective coping behaviors will improve and Compliance with prescribed medications will improve  Medication Management: RN will administer medications as ordered by provider, will assess and evaluate patient's response and provide education to patient for prescribed medication. RN will report any adverse and/or side  effects to prescribing provider.  Therapeutic Interventions: 1 on 1 counseling sessions, Psychoeducation, Medication administration, Evaluate responses to treatment, Monitor vital signs and CBGs as ordered, Perform/monitor CIWA, COWS, AIMS and Fall Risk screenings as ordered, Perform wound care treatments as ordered.  Evaluation of Outcomes: Adequate for Discharge   LCSW Treatment Plan for Primary Diagnosis: Major depressive disorder, recurrent severe without psychotic features (HCC) Long Term Goal(s): Safe transition to appropriate next level of care at discharge, Engage patient in therapeutic group addressing interpersonal concerns.  Short Term Goals: Engage patient in aftercare planning with referrals and resources, Increase social support, Increase ability to appropriately verbalize feelings, Increase emotional regulation, Facilitate acceptance of mental health diagnosis and concerns, Facilitate patient progression through stages of change regarding substance use diagnoses and concerns, Identify triggers associated with mental health/substance abuse issues and Increase skills for wellness and recovery  Therapeutic Interventions: Assess for all discharge needs, 1 to 1 time with Social worker, Explore available resources and support systems, Assess for adequacy in community support network, Educate family and significant other(s) on suicide prevention, Complete Psychosocial Assessment, Interpersonal group therapy.  Evaluation of Outcomes: Adequate for Discharge   Progress in Treatment: Attending groups: No. Participating in groups: No. Taking medication as prescribed: Yes. Toleration medication: Yes. Family/Significant other contact made: No, will contact:  patient refusing consent at this time  Patient understands diagnosis: Yes. Discussing patient identified problems/goals with staff: Yes. Medical problems stabilized or resolved: Yes. Denies suicidal/homicidal ideation:  Yes. Issues/concerns per patient self-inventory: No. Other:  New problem(s) identified: None   New Short Term/Long Term Goal(s):Detox, medication stabilization, elimination of SI thoughts, development of comprehensive mental wellness plan.   Patient Goals: "I just wanted help with my alcoholism"  Discharge Plan or Barriers: Patient plans to return to [place], [place] to live with his step daughter. The patient is following up with [facility] for outpatient medication management and therapy services.   Reason for Continuation of Hospitalization: None   Estimated Length of Stay: Discharging, Wednesday, 07/23/17.  Attendees: Patient: Craig Perry  07/23/2017 8:42 AM  Physician: Dr. Jolyne Loa, MD 07/23/2017 8:42 AM  Nursing: Tyler Aas. Dorris Carnes, RN 07/23/2017 8:42 AM  RN Care Manager: Juliann Pares 07/23/2017 8:42 AM  Social Worker: Baldo Daub, LCSWA 07/23/2017 8:42 AM  Recreational Therapist: Juliann Pares 07/23/2017 8:42 AM  Other: X 07/23/2017 8:42 AM  Other: X 07/23/2017 8:42 AM  Other:X 07/23/2017 8:42 AM    Scribe for Treatment Team: Maeola Sarah, LCSWA 07/23/2017 8:42 AM

## 2017-07-23 NOTE — [facility] Suicide Risk Assessment (Signed)
[facility]   Principal Problem: Major depressive disorder, recurrent severe without psychotic features [facility](HCC) Discharge Diagnoses:  Patient Active Problem List   Diagnosis Date Noted  . Alcohol use disorder, moderate, dependence (HCC) [F10.20] 07/22/2017  . Major depressive disorder, recurrent severe without psychotic features (HCC) [F33.2] 07/21/2017  . CAD (coronary artery disease) [I25.10] 06/16/2017  . Acute on chronic combined systolic and diastolic CHF (congestive heart failure) (HCC) [I50.43]   . Hypertension [I10]   . ST elevation myocardial infarction (STEMI) of inferior wall, initial episode of care [facility](HCC): Type I MI [I21.19] 04/20/2016  . 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 Chest pain [R07.9] 03/07/2013  . Tobacco abuse [Z72.0] 03/07/2013  . ETOH abuse [F10.10] 03/07/2013  . Unintentional weight loss [R63.4] 03/07/2013    Total Time spent with patient: 30 minutes  Musculoskeletal: Strength & Muscle Tone: within normal limits Gait & Station: normal Patient leans: N/A  Psychiatric Specialty Exam: Review of Systems  Constitutional: Negative for chills and fever.  Respiratory: Negative for cough and shortness of breath.   Cardiovascular: Negative for chest pain.  Gastrointestinal: Negative for abdominal pain, heartburn, nausea and vomiting.  Psychiatric/Behavioral: Negative for depression, hallucinations and suicidal ideas. The patient is not nervous/anxious and does not have insomnia.     Blood pressure 111/89, pulse (!) 113, temperature 97.9 F (36.6 C), temperature source Oral, resp. rate 18, height 5' 8.5" (1.74 m), weight 67.1 kg (148 lb).Body mass index is 22.18 kg/m.  General Appearance: Casual and Fairly Groomed  Patent attorneyye Contact::  Good  Speech:  Clear and Coherent and Normal Rate  Volume:  Normal  Mood:  Euthymic  Affect:  Appropriate and Congruent  Thought Process:  Coherent and Goal Directed  Orientation:  Full  (Time, Place, and Person)  Thought Content:  Logical  Suicidal Thoughts:  No  Homicidal Thoughts:  No  Memory:  Immediate;   Fair Recent;   Fair Remote;   Fair  Judgement:  Fair  Insight:  Lacking  Psychomotor Activity:  Normal  Concentration:  Good  Recall:  Good  Fund of Knowledge:Good  Language: Good  Akathisia:  No  Handed:    AIMS (if indicated):     Assets:  Manufacturing systems engineerCommunication Skills Physical Health Resilience Social Support  Sleep:  Number of Hours: 5.75  Cognition: WNL  ADL's:  Intact   Mental Status Per Nursing Assessment::   On Admission:     Demographic Factors:  Male, Caucasian and Low socioeconomic status  Loss Factors: Financial problems/change in socioeconomic status  Historical Factors: Impulsivity  Risk Reduction Factors:   Responsible for children under 59 years of age, Sense of responsibility to family, Positive social support, Positive therapeutic relationship and Positive coping skills or problem solving skills  Continued Clinical Symptoms:  Severe Anxiety and/or Agitation Depression:   Comorbid alcohol abuse/dependence Alcohol/Substance Abuse/Dependencies  Cognitive Features That Contribute To Risk:  None    Suicide Risk:  Minimal: No identifiable suicidal ideation.  Patients presenting with no risk factors but with morbid ruminations; may be classified as minimal risk based on the severity of the depressive symptoms  Follow-up Information    Services, [facility]. Go on 07/30/2017.   Why:  Appointment is Wednesday, 07/30/17 at 9:00am. Please be sure to bring your Photo ID, SSN, any insurance information and your discharge paperwork from your hospitalization. If you cannot make this date, please call the provided number and reschedule.  Contact information: [address] [place] [id number] [phone]  Subjective Data:  Craig Perry is a 57 y/o M with psychiatric history of MDD and alcohol use disorder and medical history of  CAD and STEMI who was admitted voluntarily from [facility] ED after he was brought in by police when his girlfriend had called and reported that patient had made suicidal statements that he was going to cut his throat while intoxicated and in the context of argument with his girlfriend. Pt had BAL of 33 on arrival. Pt had similar presentation to ED on 3/20 at which time he was discharged to outpatient level of care. Pt was in agreement to be transferred to [facility] for additional treatment and evaluation. He was started on alcohol withdrawal protocol with librium. He was started on naltrexone to address symptoms of cravings. He reported improvement and stability of his mood symptoms during his stay.  Today upon evaluation, pt shares, "Things are going great." He denies any specific concerns. He is sleeping well. His appetite is good. He denies physical complaints. He denies SI/HI/AH/VH. He reports he is tolerating his medication well, and he feels that naltrexone has been helpful for his symptoms of cravings. He is in agreement to continue his current regimen without changes. He plans to have follow up at [facility] in [place]. He was able to engage in safety planning including plan to return to [facility] or contact emergency services if he feels unable to maintain his own safety or the safety of others. Pt had no further questions, comments, or concerns.   Plan Of Care/Follow-up recommendations:   -Discharge to outpatient level of care  -Alcohol use disorder             - Continue naltrexone 50mg  po qDay             - Discontinue CIWA with librium  -Anxiety             - continue atarax 25mg  po q6h prn anxiety  - Hx of CAD/STEMI              -Continue aspirin 81mg  po qDay             - Continue nitrostat 0.4mg  SL q62mins x3 PRN chest pain  -Insomnia             -Continue trazodone 50mg  po qhs prn insomnia  Activity:  as tolerated Diet:  normal Tests:  NA Other:  see above for DC  plan  Micheal Likens, MD 07/23/2017, 8:29 AM

## 2017-07-23 NOTE — Progress Notes (Signed)
Patient ID: Craig Perry, male   DOB: 03-Nov-1960, 57 y.o.   MRN: [id number]    Patient educated on his discharge instructions and verbalized understanding.  Pt left [facility] with all of his discharge instructions, prescription scripts and a week's worth of sample medications, and all personal belongings.  Transportation was provided to [facility] hospital by [employer] transportation who met patient at the hospital lobby.

## 2017-07-23 NOTE — Progress Notes (Signed)
  [facility] :  Will you be returning to the same living situation after discharge:  No. The patient is going to stay with his daughter-in-law in [place], [place] at discharge.  At discharge, do you have transportation home?: No. Patient will be [facility] by [employer] at discharge. Patient reports he will be able to get to his home once he [place], [place].  Do you have the ability to pay for your medications: No.  Release of information consent forms completed and in the chart;  Patient's signature needed at discharge.  Patient to Follow up at: Follow-up Information    [facility], [facility]. Go on 07/30/2017.   Why:  Appointment is Wednesday, 07/30/17 at 9:00am. Please be sure to bring your Photo ID, SSN, any insurance information and your discharge paperwork from your hospitalization. If you cannot make this date, please call the provided number and reschedule.  Contact information: [address] [place] [id number] [phone]           Next level of care provider has access to [facility]:yes  Safety Planning and Suicide Prevention discussed: Yes,  with the patient  Have you used any form of tobacco in the last 30 days? (Cigarettes, Smokeless Tobacco, Cigars, and/or Pipes): Yes  Has patient been referred to the Quitline?: Patient refused referral  Patient has been referred for addiction treatment: Pt. refused referral  Maeola SarahJolan E Drexler Maland, LCSWA 07/23/2017, 9:43 AM

## 2017-07-23 NOTE — Discharge Summary (Addendum)
Physician Discharge Summary Note  Patient:  Craig Perry is an 57 y.o., male MRN:  [id number]  DOB:  [birth year]  Patient phone:  [phone] (home)   Patient address:   [address] [place] [address],   Total Time spent with patient: Greater than 30 minutes  Date of Admission:  07/21/2017 Date of Discharge: 07-23-17  Reason for Admission: Reports indicated suicidal threats to cut his throat while intoxicated during an argument with girlfriend.  Principal Problem: Major depressive disorder, recurrent severe without psychotic features [facility]) Discharge Diagnoses: Patient Active Problem List   Diagnosis Date Noted  . Alcohol use disorder, moderate, dependence (HCC) [F10.20] 07/22/2017  . Major depressive disorder, recurrent severe without psychotic features (HCC) [F33.2] 07/21/2017  . CAD (coronary artery disease) [I25.10] 06/16/2017  . Acute on chronic combined systolic and diastolic CHF (congestive heart failure) (HCC) [I50.43]   . Hypertension [I10]   . ST elevation myocardial infarction (STEMI) of inferior wall, initial episode of care [facility]): Type I MI [I21.19] 04/20/2016  . Alcoholic cardiomyopathy (HCC) [I69.6] 03/08/2013  . Chest pain [R07.9] 03/07/2013  . Tobacco abuse [Z72.0] 03/07/2013  . ETOH abuse [F10.10] 03/07/2013  . Unintentional weight loss [R63.4] 03/07/2013   Past Psychiatric History: Hx. Alcohol use disorder  Past Medical History:  Past Medical History:  Diagnosis Date  . Alcohol abuse   . CAD (coronary artery disease)    a. 2014 cath with non obst CAD.    b. 04/2016: inferior STEMI s/p DES to RCA. 70% LAD wtih no intervention  . Hypertension   . Myocardial infarction (HCC)   . Pneumothorax   . Stroke [facility])     Past Surgical History:  Procedure Laterality Date  . ABDOMINAL SURGERY    . CARDIAC CATHETERIZATION N/A 04/20/2016   Procedure: Left Heart Cath and Coronary Angiography;  Surgeon: Marykay Lex, MD;  Location: [facility] INVASIVE CV LAB;   Service: Cardiovascular;  Laterality: N/A;  . CARDIAC CATHETERIZATION N/A 04/20/2016   Procedure: Coronary Stent Intervention;  Surgeon: Marykay Lex, MD;  Location: [facility] INVASIVE CV LAB;  Service: Cardiovascular;  Laterality: N/A;  . COLON SURGERY    . LEFT HEART CATHETERIZATION WITH CORONARY ANGIOGRAM N/A 03/08/2013   Procedure: LEFT HEART CATHETERIZATION WITH CORONARY ANGIOGRAM;  Surgeon: Micheline Chapman, MD;  Location: [facility] CATH LAB;  Service: Cardiovascular;  Laterality: N/A;  . LIVER SURGERY    . SPLENECTOMY     Family History:  Family History  Problem Relation Age of Onset  . Heart attack Mother   . Diabetes Mother   . Stroke Mother   . Heart attack Father   . Diabetes Father    Family Psychiatric  History: See H&P Social History:  Social History   Substance and Sexual Activity  Alcohol Use Yes  . Alcohol/week: 7.2 oz  . Types: 12 Cans of beer per week   Comment: daily 18 beers (reports he drank this amount yesterday)      Social History   Substance and Sexual Activity  Drug Use No    Social History   Socioeconomic History  . Marital status: Single    Spouse name: Not on file  . Number of children: Not on file  . Years of education: Not on file  . Highest education level: Not on file  Occupational History  . Not on file  Social Needs  . Financial resource strain: Not on file  . Food insecurity:    Worry: Not on file  Inability: Not on file  . Transportation needs:    Medical: Not on file    Non-medical: Not on file  Tobacco Use  . Smoking status: Current Every Day Smoker    Packs/day: 2.00    Years: 45.00    Pack years: 90.00    Types: Cigarettes  . Smokeless tobacco: Never Used  Substance and Sexual Activity  . Alcohol use: Yes    Alcohol/week: 7.2 oz    Types: 12 Cans of beer per week    Comment: daily 18 beers (reports he drank this amount yesterday)   . Drug use: No  . Sexual activity: Not Currently  Lifestyle  . Physical activity:    Days  per week: Not on file    Minutes per session: Not on file  . Stress: Not on file  Relationships  . Social connections:    Talks on phone: Not on file    Gets together: Not on file    Attends religious service: Not on file    Active member of club or organization: Not on file    Attends meetings of clubs or organizations: Not on file    Relationship status: Not on file  Other Topics Concern  . Not on file  Social History Narrative  . Not on file   Hospital Course: (Per Md's discharge SRA): Salvatore Decenthomas Budzynski is a 57 y/o M with psychiatric history of MDD and alcohol use disorder and medical history of CAD and STEMI who was [facility] after he was brought in by police when his girlfriend had called and reported that patient had made suicidal statements that he was going to cut his throat while intoxicated and in the context of argument with his girlfriend. Pt had BAL of 33 on arrival. Pt had similar presentation to ED on 3/20 at which time he was discharged to outpatient level of care. Pt was in [facility] and [facility]. He was started on alcohol withdrawal protocol with librium. He was started on naltrexone to address symptoms of cravings. He reported improvement and stability of his mood symptoms during his stay.  Besides the use of Librium detoxification treatment protocols for alcohol detox & Naltrexone 50 mg for alcohol cravings during [facility], Maisie Fushomas was also resumed on ASA 81 mg for his hx of cardiac issues & NTG tablets 0.4 mg Q 5 minutes x 3 prn for Hx. acute chest pains. He was also medicated & discharged on Trazodone 50 mg po prn for insomnia. He was enrolled & briefly participated in the group counseling sessions being offered & held on this unit. He learned coping some skills. He tolerated his treatment regimen without any adverse effects or reactions reported.  Today upon his  discharge evaluation with the attending psychiatrist, pt shares, "Things are going great." He denies any specific concerns. He is sleeping well. His appetite is good. He denies physical complaints. He denies SI/HI/AH/VH. He reports he is tolerating his medication well, and he feels that naltrexone has been helpful for his symptoms of cravings. He is in agreement to continue his current regimen without changes. He plans to [facility] [place]. He was able to engage in safety planning including [facility] or contact emergency services if he feels unable to maintain his own safety or the safety of others. Pt had no further questions, comments, or concerns.  Upon  discharge, Vidur presents mentally & medically stable. He is being discharged to continue mental health care, medication management & further substance abuse treatment on an outpatient basis as noted below. He is provided with all the necessary information needed to make this appointment without problems. He received from the [facility] pharmacy, a 7 days worth, supply samples of his [facility] discharge medications. He left [facility] with all personal belongings in no apparent distress. Transportation per [employer].  Physical Findings: AIMS: Facial and Oral Movements Muscles of Facial Expression: None, normal Lips and Perioral Area: None, normal Jaw: None, normal Tongue: None, normal,Extremity Movements Upper (arms, wrists, hands, fingers): None, normal Lower (legs, knees, ankles, toes): None, normal, Trunk Movements Neck, shoulders, hips: None, normal, Overall Severity Severity of abnormal movements (highest score from questions above): None, normal Incapacitation due to abnormal movements: None, normal Patient's awareness of abnormal movements (rate only patient's report): No Awareness, Dental Status Current problems with teeth and/or dentures?: Yes Does patient usually wear dentures?: No  CIWA:  CIWA-Ar  Total: 3 COWS:     Musculoskeletal: Strength & Muscle Tone: within normal limits Gait & Station: normal Patient leans: N/A  Psychiatric Specialty Exam: Physical Exam  Constitutional: He appears well-developed.  HENT:  Head: Normocephalic.  Eyes: Pupils are equal, round, and reactive to light.  Neck: Normal range of motion.  Cardiovascular: Normal rate.  Hx. Cardiac issues  Respiratory: Effort normal.  GI: Soft.  Genitourinary:  Genitourinary Comments: Deferred  Musculoskeletal: Normal range of motion.  Neurological: He is alert.  Skin: Skin is warm.    Review of Systems  Constitutional: Negative.   HENT: Negative.   Eyes: Negative.   Respiratory: Negative.   Cardiovascular: Negative.  Negative for chest pain, palpitations and leg swelling.       Hx. Cardiac issues with s/p stent placement.  Gastrointestinal: Negative.   Genitourinary: Negative.   Musculoskeletal: Negative.   Skin: Negative.   Neurological: Negative.   Endo/Heme/Allergies: Negative.   Psychiatric/Behavioral: Positive for substance abuse (Hx. alcoholism). Negative for depression, hallucinations, memory loss and suicidal ideas. The patient is not nervous/anxious and does not have insomnia.     Blood pressure 111/89, pulse (!) 113, temperature 97.9 F (36.6 C), temperature source Oral, resp. rate 18, height 5' 8.5" (1.74 m), weight 67.1 kg (148 lb).Body mass index is 22.18 kg/m.  See Md's SRA   Have you used any form of tobacco in the last 30 days? (Cigarettes, Smokeless Tobacco, Cigars, and/or Pipes): Yes  Has this patient used any form of tobacco in the last 30 days? (Cigarettes, Smokeless Tobacco, Cigars, and/or Pipes): N/A  Blood Alcohol level:  Lab Results  Component Value Date   ETH 30 (H) 07/20/2017   ETH 81 (H) 06/18/2017   Metabolic Disorder Labs:  Lab Results  Component Value Date   HGBA1C 5.9 (H) 04/20/2016   MPG 123 04/20/2016   MPG 120 (H) 03/07/2013   No results found for:  PROLACTIN Lab Results  Component Value Date   CHOL 204 (H) 07/22/2017   TRIG 164 (H) 07/22/2017   HDL 45 07/22/2017   CHOLHDL 4.5 07/22/2017   VLDL 33 07/22/2017   LDLCALC 126 (H) 07/22/2017   LDLCALC 99 06/16/2017    See Psychiatric Specialty Exam and Suicide Risk Assessment completed by Attending Physician prior to discharge.  Discharge destination:  Other:  Daughter inlaw's home.  Is patient on multiple antipsychotic therapies at discharge:  No   Has Patient had three or more failed trials  of antipsychotic monotherapy by history:  No  Recommended Plan for Multiple Antipsychotic Therapies: NA  Allergies as of 07/23/2017   No Known Allergies     Medication List    TAKE these medications     Indication  aspirin 81 MG EC tablet Take 1 tablet (81 mg total) by mouth daily. (May buy this from over the counter at yr local pharmacy): For heart health What changed:  additional instructions  Indication:  Heart health   naltrexone 50 MG tablet Commonly known as:  DEPADE Take 1 tablet (50 mg total) by mouth daily. For alcoholism Start taking on:  07/24/2017  Indication:  Excessive Use of Alcohol   nitroGLYCERIN 0.4 MG SL tablet Commonly known as:  NITROSTAT Place 1 tablet (0.4 mg total) under the tongue every 5 (five) minutes x 3 doses as needed for chest pain.  Indication:  Acute Angina Pectoris   traZODone 50 MG tablet Commonly known as:  DESYREL Take 1 tablet (50 mg total) by mouth at bedtime as needed for sleep.  Indication:  Trouble Sleeping      Follow-up Information    [facility], [facility]. Go on 07/30/2017.   Why:  Appointment is Wednesday, 07/30/17 at 9:00am. Please be sure to bring your Photo ID, SSN, any insurance information and your discharge paperwork from your hospitalization. If you cannot make this date, please call the provided number and reschedule.  Contact information: [address] [place] [address] [phone]          Follow-up  recommendations: Activity:  As tolerated Diet: As recommended by your primary care doctor. Keep all scheduled follow-up appointments as recommended.   Comments: Patient is instructed prior to discharge to: Take all medications as prescribed by his/her mental healthcare provider. Report any adverse effects and or reactions from the medicines to his/her outpatient provider promptly. Patient has been instructed & cautioned: To not engage in alcohol and or illegal drug use while on prescription medicines. In the event of worsening symptoms, patient is instructed to call the crisis hotline, 911 and or go to the nearest ED for appropriate evaluation and treatment of symptoms. To follow-up with his/her primary care provider for your other medical issues, concerns and or health care needs.   Signed: Armandina Stammer, NP, PMHNP, FNP-BC 07/23/2017, 9:26 AM   Patient seen, Suicide Assessment Completed.  Disposition Plan Reviewed

## 2017-07-23 NOTE — Progress Notes (Signed)
Recreation Therapy Notes  Date: 4.24.19 Time: 0930 Location: [address] Dayroom  Group Topic: Stress Management  Goal Area(s) Addresses:  Patient will verbalize importance of using healthy stress management.  Patient will identify positive emotions associated with healthy stress management.   Behavioral Response: Engaged  Intervention: Stress Management  Activity :  Guided Imagery.  LRT introduced the stress management technique of guided imagery.  LRT read a script leading patients on a journey to the beach.  Patients were to listen and follow along as script was read.  Education:  Stress Management, Discharge Planning.   Education Outcome: Acknowledges edcuation/In group clarification offered/Needs additional education  Clinical Observations/Feedback: Pt attended and participated in group.    Caroll RancherMarjette Rain [place], LRT/CTRS     Caroll RancherLindsay, Sophea Rackham A 07/23/2017 12:09 PM

## 2017-11-18 ENCOUNTER — Observation Stay (HOSPITAL_COMMUNITY)
Admission: EM | Admit: 2017-11-18 | Discharge: 2017-11-19 | Disposition: A | Discharge status: Home / Self Care | Payer: Self-pay | Attending: Internal Medicine | Admitting: Internal Medicine

## 2017-11-18 ENCOUNTER — Encounter (HOSPITAL_COMMUNITY): Payer: Self-pay

## 2017-11-18 ENCOUNTER — Other Ambulatory Visit: Payer: Self-pay

## 2017-11-18 ENCOUNTER — Emergency Department (HOSPITAL_COMMUNITY): Payer: Self-pay

## 2017-11-18 DIAGNOSIS — R110 Nausea: Secondary | ICD-10-CM

## 2017-11-18 DIAGNOSIS — I10 Essential (primary) hypertension: Secondary | ICD-10-CM | POA: Diagnosis present

## 2017-11-18 DIAGNOSIS — F1020 Alcohol dependence, uncomplicated: Secondary | ICD-10-CM | POA: Diagnosis present

## 2017-11-18 DIAGNOSIS — I209 Angina pectoris, unspecified: Secondary | ICD-10-CM

## 2017-11-18 DIAGNOSIS — R079 Chest pain, unspecified: Principal | ICD-10-CM | POA: Diagnosis present

## 2017-11-18 DIAGNOSIS — Z720 Tobacco use: Secondary | ICD-10-CM | POA: Diagnosis present

## 2017-11-18 DIAGNOSIS — I200 Unstable angina: Secondary | ICD-10-CM

## 2017-11-18 DIAGNOSIS — F17210 Nicotine dependence, cigarettes, uncomplicated: Secondary | ICD-10-CM | POA: Insufficient documentation

## 2017-11-18 DIAGNOSIS — I426 Alcoholic cardiomyopathy: Secondary | ICD-10-CM | POA: Diagnosis present

## 2017-11-18 HISTORY — DX: Patient's noncompliance with other medical treatment and regimen: Z91.19

## 2017-11-18 HISTORY — DX: Cocaine abuse, uncomplicated: F14.10

## 2017-11-18 HISTORY — DX: Patient's noncompliance with other medical treatment and regimen due to unspecified reason: Z91.199

## 2017-11-18 LAB — BASIC METABOLIC PANEL
ANION GAP: 9 (ref 5–15)
BUN: 10 mg/dL (ref 6–20)
CALCIUM: 9.4 mg/dL (ref 8.9–10.3)
CO2: 26 mmol/L (ref 22–32)
CREATININE: 0.84 mg/dL (ref 0.61–1.24)
Chloride: 103 mmol/L (ref 98–111)
GFR calc non Af Amer: >60 mL/min (ref >60)
Glucose, Bld: 119 mg/dL — ABNORMAL HIGH (ref 70–99)
Potassium: 4.1 mmol/L (ref 3.5–5.1)
SODIUM: 138 mmol/L (ref 135–145)

## 2017-11-18 LAB — CBC
HCT: 50.4 % (ref 39.0–52.0)
Hemoglobin: 17.5 g/dL — ABNORMAL HIGH (ref 13.0–17.0)
MCH: 31.1 pg (ref 26.0–34.0)
MCHC: 34.7 g/dL (ref 30.0–36.0)
MCV: 89.7 fL (ref 78.0–100.0)
Platelets: 150 10*3/uL (ref 150–400)
RBC: 5.62 MIL/uL (ref 4.22–5.81)
RDW: 15.2 % (ref 11.5–15.5)
WBC: 5.3 10*3/uL (ref 4.0–10.5)

## 2017-11-18 LAB — I-STAT TROPONIN, ED
TROPONIN I, POC: 0.03 ng/mL (ref 0.00–0.08)
TROPONIN I, POC: 0.01 ng/mL (ref 0.00–0.08)

## 2017-11-18 LAB — TROPONIN I
Troponin I: <0.03 ng/mL (ref <0.03)
Troponin I: <0.03 ng/mL (ref <0.03)

## 2017-11-18 LAB — HEMOGLOBIN A1C
HEMOGLOBIN A1C: 5.7 % — AB (ref 4.8–5.6)
Mean Plasma Glucose: 116.89 mg/dL

## 2017-11-18 MED ORDER — LORAZEPAM 1 MG PO TABS: 1.0000 mg | ORAL_TABLET | Freq: Four times a day (QID) | ORAL | Status: DC | PRN

## 2017-11-18 MED ORDER — ONDANSETRON HCL 4 MG PO TABS: 4.0000 mg | ORAL_TABLET | Freq: Four times a day (QID) | ORAL | Status: DC | PRN

## 2017-11-18 MED ORDER — ACETAMINOPHEN 650 MG RE SUPP: 650.0000 mg | Freq: Four times a day (QID) | RECTAL | Status: DC | PRN

## 2017-11-18 MED ORDER — ASPIRIN 81 MG PO CHEW
324.0000 mg | CHEWABLE_TABLET | Freq: Once | ORAL | Status: AC
  Administered 2017-11-18: 324 mg via ORAL
  Filled 2017-11-18: qty 4

## 2017-11-18 MED ORDER — ASPIRIN 325 MG PO TABS
325.0000 mg | ORAL_TABLET | Freq: Every day | ORAL | Status: DC
  Filled 2017-11-18: qty 1

## 2017-11-18 MED ORDER — METOPROLOL TARTRATE 25 MG PO TABS
25.0000 mg | ORAL_TABLET | Freq: Two times a day (BID) | ORAL | Status: DC
  Administered 2017-11-18 (×2): 25 mg via ORAL
  Filled 2017-11-18 (×3): qty 1

## 2017-11-18 MED ORDER — THIAMINE HCL 100 MG/ML IJ SOLN: 100.0000 mg | Freq: Every day | INTRAMUSCULAR | Status: DC

## 2017-11-18 MED ORDER — SODIUM CHLORIDE 0.9 % IV BOLUS
500.0000 mL | Freq: Once | INTRAVENOUS | Status: AC
  Administered 2017-11-18: 500 mL via INTRAVENOUS

## 2017-11-18 MED ORDER — LORAZEPAM 2 MG/ML IJ SOLN: 1.0000 mg | Freq: Four times a day (QID) | INTRAMUSCULAR | Status: DC | PRN

## 2017-11-18 MED ORDER — FOLIC ACID 1 MG PO TABS
1.0000 mg | ORAL_TABLET | Freq: Every day | ORAL | Status: DC
  Administered 2017-11-18 – 2017-11-19 (×2): 1 mg via ORAL
  Filled 2017-11-18 (×2): qty 1

## 2017-11-18 MED ORDER — ACETAMINOPHEN 325 MG PO TABS: 650.0000 mg | ORAL_TABLET | Freq: Four times a day (QID) | ORAL | Status: DC | PRN

## 2017-11-18 MED ORDER — NICOTINE 21 MG/24HR TD PT24
21.0000 mg | MEDICATED_PATCH | Freq: Every day | TRANSDERMAL | Status: DC
  Filled 2017-11-18: qty 1

## 2017-11-18 MED ORDER — ENOXAPARIN SODIUM 40 MG/0.4ML ~~LOC~~ SOLN
40.0000 mg | SUBCUTANEOUS | Status: DC
  Administered 2017-11-18: 40 mg via SUBCUTANEOUS
  Filled 2017-11-18: qty 0.4

## 2017-11-18 MED ORDER — ADULT MULTIVITAMIN W/MINERALS CH
1.0000 | ORAL_TABLET | Freq: Every day | ORAL | Status: DC
  Administered 2017-11-18 – 2017-11-19 (×2): 1 via ORAL
  Filled 2017-11-18 (×2): qty 1

## 2017-11-18 MED ORDER — VITAMIN B-1 100 MG PO TABS
100.0000 mg | ORAL_TABLET | Freq: Every day | ORAL | Status: DC
  Administered 2017-11-18 – 2017-11-19 (×2): 100 mg via ORAL
  Filled 2017-11-18 (×2): qty 1

## 2017-11-18 MED ORDER — ONDANSETRON HCL 4 MG/2ML IJ SOLN: 4.0000 mg | Freq: Four times a day (QID) | INTRAMUSCULAR | Status: DC | PRN

## 2017-11-18 NOTE — ED Notes (Signed)
Patient transported to X-ray 

## 2017-11-18 NOTE — ED Provider Notes (Signed)
[facility] Emergency Department Provider Note MRN:  [id number]  Arrival date & time: 11/18/17     Chief Complaint   Nausea and arms tingling   History of Present Illness   Craig Perry is a 57 y.o. year-old male with a history of CAD status post stent placement presenting to the ED with chief complaint of nausea and paresthesias.  Patient explains that he is recently been working 80 to 90-hour work weeks.  Yesterday at work, while walking in the heat, experienced intermittent chest pain, nausea.  Told his boss he did not feel well, home.  Intermittent central pressure-like chest pain throughout the night while trying to sleep.  Woke this morning at 3 AM to feed his child, experienced sudden onset nausea, mild shortness of breath, as well as bilateral arm paresthesias.  Explains that these symptoms were exactly the same the last time he had a heart attack.  Denies recent fever cough, no abdominal pain.  No exacerbating or relieving factors.  Review of Systems  A complete 10 system review of systems was obtained and all systems are negative except as noted in the HPI and PMH.   Patient's Health History    Past Medical History:  Diagnosis Date  . Alcohol abuse   . CAD (coronary artery disease)    a. 2014 cath with non obst CAD.    b. 04/2016: inferior STEMI s/p DES to RCA. 70% LAD wtih no intervention  . Hypertension   . Myocardial infarction (HCC)   . Pneumothorax   . Stroke [facility](HCC)     Past Surgical History:  Procedure Laterality Date  . ABDOMINAL SURGERY    . CARDIAC CATHETERIZATION N/A 04/20/2016   Procedure: Left Heart Cath and Coronary Angiography;  Surgeon: Marykay Lexavid W Harding, MD;  [facility];  Service: Cardiovascular;  Laterality: N/A;  . CARDIAC CATHETERIZATION N/A 04/20/2016   Procedure: Coronary Stent Intervention;  Surgeon: Marykay Lexavid W Harding, MD;  [facility];  Service: Cardiovascular;  Laterality: N/A;  . COLON SURGERY      . LEFT HEART CATHETERIZATION WITH CORONARY ANGIOGRAM N/A 03/08/2013   Procedure: LEFT HEART CATHETERIZATION WITH CORONARY ANGIOGRAM;  Surgeon: Micheline ChapmanMichael D Cooper, MD;  [facility];  Service: Cardiovascular;  Laterality: N/A;  . LIVER SURGERY    . SPLENECTOMY      Family History  Problem Relation Age of Onset  . Heart attack Mother   . Diabetes Mother   . Stroke Mother   . Heart attack Father   . Diabetes Father     Social History   Socioeconomic History  . Marital status: Single    Spouse name: Not on file  . Number of children: Not on file  . Years of education: Not on file  . Highest education level: Not on file  Occupational History  . Not on file  Social Needs  . Financial resource strain: Not on file  . Food insecurity:    Worry: Not on file    Inability: Not on file  . Transportation needs:    Medical: Not on file    Non-medical: Not on file  Tobacco Use  . Smoking status: Current Every Day Smoker    Packs/day: 2.00    Years: 45.00    Pack years: 90.00    Types: Cigarettes  . Smokeless tobacco: Never Used  Substance and Sexual Activity  . Alcohol use: Yes    Alcohol/week: 12.0 standard drinks  Types: 12 Cans of beer per week    Comment: 3-6 beers a day.  formerly 18/day  . Drug use: No  . Sexual activity: Not Currently  Lifestyle  . Physical activity:    Days per week: Not on file    Minutes per session: Not on file  . Stress: Not on file  Relationships  . Social connections:    Talks on phone: Not on file    Gets together: Not on file    Attends religious service: Not on file    Active member of club or organization: Not on file    Attends meetings of clubs or organizations: Not on file    Relationship status: Not on file  . Intimate partner violence:    Fear of current or ex partner: Not on file    Emotionally abused: Not on file    Physically abused: Not on file    Forced sexual activity: Not on file  Other Topics Concern  . Not on  file  Social History Narrative  . Not on file     Physical Exam  Vital Signs and Nursing Notes reviewed Vitals:   11/18/17 1021 11/18/17 1030  BP: (!) 142/96 (!) 139/98  Pulse: 93 98  Resp: 16 15  Temp: 98 F (36.7 C)   SpO2: 96% 95%    CONSTITUTIONAL: Chronically ill-appearing, NAD NEURO:  Alert and oriented x 3, no focal deficits EYES:  eyes equal and reactive ENT/NECK:  no LAD, no JVD CARDIO: Tachycardic rate, well-perfused, normal S1 and S2 PULM:  CTAB no wheezing or rhonchi GI/GU:  normal bowel sounds, non-distended, non-tender MSK/SPINE:  No gross deformities, no edema SKIN:  no rash, atraumatic PSYCH:  Appropriate speech and behavior  Diagnostic and Interventional Summary    EKG Interpretation  Date/Time:  Tuesday November 18 2017 07:52:50 EDT Ventricular Rate:  107 PR Interval:    QRS Duration: 93 QT Interval:  335 QTC Calculation: 447 R Axis:   70 Text Interpretation:  Sinus tachycardia Borderline repolarization abnormality Confirmed by Kennis Carina [phone]) on 11/18/2017 8:06:30 AM      Labs Reviewed  CBC - Abnormal; Notable for the following components:      Result Value   Hemoglobin 17.5 (*)    All other components within normal limits  BASIC METABOLIC PANEL - Abnormal; Notable for the following components:   Glucose, Bld 119 (*)    All other components within normal limits  I-STAT TROPONIN, ED  I-STAT TROPONIN, ED    DG Chest 2 View  Final Result      Medications  aspirin chewable tablet 324 mg (324 mg Oral Given 11/18/17 0813)  sodium chloride 0.9 % bolus 500 mL (0 mLs Intravenous Stopped 11/18/17 0848)     Procedures Critical Care  ED Course and Medical Decision Making  I have reviewed the triage vital signs and the nursing notes.  Pertinent labs & imaging results that were available during my care of the patient were reviewed by me and considered in my medical decision making (see below for details). Clinical Course as of Nov 18 1099    Tue Nov 18, 2017  0804 Concern for acute coronary syndrome in this 57 year old male with a history of CAD and stent placement, here with atypical symptoms but symptoms consistent with his prior MI.  No evidence of DVT, little to no concern for PE at this time.  Work-up pending.   [MB]    Clinical Course User Index [MB] Mariem Skolnick,  Elmer SowMichael M, MD    Troponins negative x2, but given the concerning history and patient's past medical history, patient was admitted to hospital service for further care and evaluation.  Elmer SowMichael M. Pilar PlateBero, MD [facility] [email or web address]  Final Clinical Impressions(s) / ED Diagnoses     ICD-10-CM   1. Unstable angina (HCC) I20.0   2. Chest pain R07.9 DG Chest 2 View    DG Chest 2 View  3. Nausea R11.0     ED Discharge Orders    None         Sabas SousBero, Nial Hawe M, MD 11/18/17 1102

## 2017-11-18 NOTE — ED Triage Notes (Signed)
Pt reports left work early yesterday due to n/v.  Denies any diarrhea.  Reports today feels very weak and both arms tingling.  Reports felt the same way when he had a heart attack.   Reports had some chest pain last night but none today.

## 2017-11-18 NOTE — H&P (Signed)
History and Physical  Craig Skeenhomas W Duerksen VWU:[id number] DOB: 15-Mar-1961 DOA: 11/18/2017   PCP: Patient, No Pcp Per   Patient coming from: Home  Chief Complaint: chest pain  HPI:  Craig Perry is a 57 y.o. 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  Patient states that he had some chest discomfort and nausea and tingling in his bilateral lower extremities at work on 11/17/2017.  He denies any worsening shortness of breath, fevers, chills, cough, hemoptysis. He states that he completed a six-month course of antiplatelet agents, but soon thereafter was not able to afford any of his medications and has not taken any of them since then.  In fact, the patient has not taken any aspirin whatsoever for at least 6 months.  He woke up around 3 AM on 11/18/2017 with some chest discomfort and tingling in his arms.  It resolved after a few minutes.  He went back to sleep.  He woke up around 5 AM with chest discomfort again and some nausea.  As result, he presented to emergency department for further evaluation.  In the emergency department, the patient was afebrile hemodynamically stable saturating 96% on room air.  BMP and CBC were unremarkable except for platelet of 150,000.  Chest x-ray was negative for acute findings.  Initial point-of-care troponins were negative.  EKG shows sinus rhythm without any ST-T wave changes.  Assessment/Plan: Angina -Cardiology consult -He is chest pain-free at this time -Restart aspirin -Lipid panel -Place on telemetry -06/17/2017 Myoview--high risk--> he left AMA prior to results of the study  Chronic systolic and diastolic CHF/alcoholic cardiomyopathy -04/21/2016 echo EF 25-30%, grade 1 DD, trivial  MR -clinically euvolemic -start BB  Essential hypertension -He is noncompliant with medications -Start metoprolol  Tobacco abuse -I have discussed tobacco cessation with the patient.  I have counseled the patient regarding the negative impacts of continued tobacco use including but not limited to lung cancer, COPD, and cardiovascular disease.  I have discussed alternatives to tobacco and modalities that may help facilitate tobacco cessation including but not limited to biofeedback, hypnosis, and medications.  Total time spent with tobacco counseling was 4 minutes.  Alcohol abuse -CIWA protocol         Past Medical History:  Diagnosis Date  . Alcohol abuse   . CAD (coronary artery disease)    a. 2014 cath with non obst CAD.    b. 04/2016: inferior STEMI s/p DES to RCA. 70% LAD wtih no intervention  . Hypertension   . Myocardial infarction (HCC)   . Pneumothorax   . Stroke [facility](HCC)    Past Surgical History:  Procedure Laterality Date  . ABDOMINAL SURGERY    . CARDIAC CATHETERIZATION N/A 04/20/2016   Procedure: Left Heart Cath and Coronary Angiography;  Surgeon: Marykay Lexavid W Harding, MD;  [facility];  Service: Cardiovascular;  Laterality: N/A;  . CARDIAC CATHETERIZATION N/A 04/20/2016   Procedure: Coronary Stent Intervention;  Surgeon: Marykay Lexavid W Harding, MD;  [facility];  Service: Cardiovascular;  Laterality: N/A;  . COLON SURGERY    . LEFT HEART CATHETERIZATION WITH CORONARY ANGIOGRAM N/A 03/08/2013   Procedure: LEFT HEART CATHETERIZATION WITH CORONARY ANGIOGRAM;  Surgeon: Micheline ChapmanMichael D Cooper, MD;  [facility];  Service:  Cardiovascular;  Laterality: N/A;  . LIVER SURGERY    . SPLENECTOMY     Social History:  reports that he has been smoking cigarettes. He has a 90.00 pack-year smoking history. He has never used smokeless tobacco. He reports that he drinks about 12.0 standard drinks of alcohol per week. He reports that he does not use  drugs.   Family History  Problem Relation Age of Onset  . Heart attack Mother   . Diabetes Mother   . Stroke Mother   . Heart attack Father   . Diabetes Father      No Known Allergies   Prior to Admission medications   Not on File    Review of Systems:  Constitutional:  No weight loss, night sweats, Fevers, chills, fatigue.  Head&Eyes: No headache.  No vision loss.  No eye pain or scotoma ENT:  No Difficulty swallowing,Tooth/dental problems,Sore throat,  No ear ache, post nasal drip,  Cardio-vascular:  No Orthopnea, PND, swelling in lower extremities,  dizziness, palpitations  GI:  No  abdominal pain, nausea, vomiting, diarrhea, loss of appetite, hematochezia, melena, heartburn, indigestion, Resp:   No cough. No coughing up of blood .No wheezing.No chest wall deformity  Skin:  no rash or lesions.  GU:  no dysuria, change in color of urine, no urgency or frequency. No flank pain.  Musculoskeletal:  No joint pain or swelling. No decreased range of motion. No back pain.  Psych:  No change in mood or affect. No depression or anxiety. Neurologic: No headache, no dysesthesia, no focal weakness, no vision loss. No syncope  Physical Exam: Vitals:   11/18/17 0830 11/18/17 0924 11/18/17 1021 11/18/17 1030  BP: (!) 139/93 (!) 142/99 (!) 142/96 (!) 139/98  Pulse: 91 88 93 98  Resp: (!) 22 18 16 15   Temp:   98 F (36.7 C)   TempSrc:   Oral   SpO2: 95% 97% 96% 95%  Weight:      Height:       General:  A&O x 3, NAD, nontoxic, pleasant/cooperative Head/Eye: No conjunctival hemorrhage, no icterus, /AT, No nystagmus ENT:  No icterus,  No thrush, good dentition, no pharyngeal exudate Neck:  No masses, no lymphadenpathy, no bruits CV:  RRR, no rub, no gallop, no S3 Lung:  Diminished BS, but CTAB, good air movement, no wheeze, no rhonchi Abdomen: soft/NT, +BS, nondistended, no peritoneal signs Ext: No cyanosis, No rashes, No petechiae, No lymphangitis, No edema Neuro:  CNII-XII intact, strength 4/5 in bilateral upper and lower extremities, no dysmetria  Labs on Admission:  Basic Metabolic Panel: Recent Labs  Lab 11/18/17 0805  NA 138  K 4.1  CL 103  CO2 26  GLUCOSE 119*  BUN 10  CREATININE 0.84  CALCIUM 9.4   Liver Function Tests: No results for input(s): AST, ALT, ALKPHOS, BILITOT, PROT, ALBUMIN in the last 168 hours. No results for input(s): LIPASE, AMYLASE in the last 168 hours. No results for input(s): AMMONIA in the last 168 hours. CBC: Recent Labs  Lab 11/18/17 0805  WBC 5.3  HGB 17.5*  HCT 50.4  MCV 89.7  PLT 150   Coagulation Profile: No results for input(s): INR, PROTIME in the last 168 hours. Cardiac Enzymes: No results for input(s): CKTOTAL, CKMB, CKMBINDEX, TROPONINI in the last 168 hours. BNP: Invalid input(s): POCBNP CBG: No results for input(s): GLUCAP in the last 168 hours. Urine analysis:    Component Value Date/Time   COLORURINE YELLOW 04/10/2013 0015   APPEARANCEUR CLEAR  04/10/2013 0015   LABSPEC <1.005 (L) 04/10/2013 0015   PHURINE 5.5 04/10/2013 0015   GLUCOSEU NEGATIVE 04/10/2013 0015   HGBUR NEGATIVE 04/10/2013 0015   BILIRUBINUR NEGATIVE 04/10/2013 0015   KETONESUR NEGATIVE 04/10/2013 0015   PROTEINUR NEGATIVE 04/10/2013 0015   UROBILINOGEN 0.2 04/10/2013 0015   NITRITE NEGATIVE 04/10/2013 0015   LEUKOCYTESUR NEGATIVE 04/10/2013 0015   Sepsis Labs: @LABRCNTIP (procalcitonin:4,lacticidven:4) )No results found for this or any previous visit (from the past 240 hour(s)).   Radiological Exams on Admission: Dg Chest 2 View  Result Date: 11/18/2017 CLINICAL DATA:  Chest pain. EXAM: CHEST - 2 VIEW COMPARISON:  Chest x-ray dated June 17, 2017. FINDINGS: The heart size and mediastinal contours are within normal limits. Normal pulmonary vascularity. The lungs remain hyperinflated with emphysematous changes. No focal consolidation, pleural effusion, or pneumothorax. Unchanged nipple shadows. No acute osseous  abnormality. IMPRESSION: COPD.  No active cardiopulmonary disease. Electronically Signed   By: Obie DredgeWilliam T Derry M.D.   On: 11/18/2017 09:01    EKG: Independently reviewed. Sinus, no ST-T changes    Time spent:60 minutes Code Status:   FULL Family Communication:  No Family at bedside Disposition Plan: expect 1-2 day hospitalization Consults called: cardiology DVT Prophylaxis: Foard Lovenox  Catarina HartshornDavid Jona Erkkila, DO  [facility] Pager [phone]  If 7PM-7AM, please contact night-coverage [email or web address] Password [email or web address] 11/18/2017, 11:36 AM

## 2017-11-19 ENCOUNTER — Observation Stay (HOSPITAL_BASED_OUTPATIENT_CLINIC_OR_DEPARTMENT_OTHER): Payer: Self-pay

## 2017-11-19 ENCOUNTER — Encounter (HOSPITAL_COMMUNITY): Payer: Self-pay | Admitting: Physician Assistant

## 2017-11-19 DIAGNOSIS — R079 Chest pain, unspecified: Secondary | ICD-10-CM

## 2017-11-19 DIAGNOSIS — R0789 Other chest pain: Secondary | ICD-10-CM

## 2017-11-19 LAB — LIPID PANEL
CHOL/HDL RATIO: 5.1 ratio
Cholesterol: 183 mg/dL (ref 0–200)
HDL: 36 mg/dL — AB (ref >40)
LDL CALC: 86 mg/dL (ref 0–99)
TRIGLYCERIDES: 303 mg/dL — AB (ref <150)
VLDL: 61 mg/dL — AB (ref 0–40)

## 2017-11-19 LAB — CBC
HEMATOCRIT: 47.6 % (ref 39.0–52.0)
HEMOGLOBIN: 16.1 g/dL (ref 13.0–17.0)
MCH: 30.7 pg (ref 26.0–34.0)
MCHC: 33.8 g/dL (ref 30.0–36.0)
MCV: 90.7 fL (ref 78.0–100.0)
Platelets: 132 10*3/uL — ABNORMAL LOW (ref 150–400)
RBC: 5.25 MIL/uL (ref 4.22–5.81)
RDW: 15.3 % (ref 11.5–15.5)
WBC: 5.7 10*3/uL (ref 4.0–10.5)

## 2017-11-19 LAB — TROPONIN I

## 2017-11-19 LAB — ECHOCARDIOGRAM COMPLETE
Height: 71 in
Weight: 2320 oz

## 2017-11-19 MED ORDER — ATORVASTATIN CALCIUM 40 MG PO TABS: 40.0000 mg | ORAL_TABLET | Freq: Every day | ORAL | Status: DC

## 2017-11-19 MED ORDER — FOLIC ACID 1 MG PO TABS: 1.0000 mg | ORAL_TABLET | Freq: Every day | ORAL | Status: DC

## 2017-11-19 MED ORDER — ASPIRIN EC 81 MG PO TBEC
81.0000 mg | DELAYED_RELEASE_TABLET | Freq: Every day | ORAL | Status: DC
  Administered 2017-11-19: 81 mg via ORAL
  Filled 2017-11-19: qty 1

## 2017-11-19 MED ORDER — ASPIRIN 81 MG PO TBEC: 81.0000 mg | DELAYED_RELEASE_TABLET | Freq: Every day | ORAL | Status: DC

## 2017-11-19 MED ORDER — CARVEDILOL 3.125 MG PO TABS: 3.1250 mg | ORAL_TABLET | Freq: Two times a day (BID) | ORAL | 2 refills | Status: DC

## 2017-11-19 MED ORDER — CARVEDILOL 3.125 MG PO TABS
3.1250 mg | ORAL_TABLET | Freq: Two times a day (BID) | ORAL | Status: DC
  Administered 2017-11-19: 3.125 mg via ORAL
  Filled 2017-11-19: qty 1

## 2017-11-19 MED ORDER — ADULT MULTIVITAMIN W/MINERALS CH: 1.0000 | ORAL_TABLET | Freq: Every day | ORAL | Status: DC

## 2017-11-19 MED ORDER — ATORVASTATIN CALCIUM 40 MG PO TABS: 40.0000 mg | ORAL_TABLET | Freq: Every day | ORAL | 2 refills | Status: DC

## 2017-11-19 NOTE — Progress Notes (Signed)
F/u scheduled 9/23 with Leda GauzeM. Lenze PA-C, appt placed on AVS. Charlton Boule PA-C

## 2017-11-19 NOTE — Consult Note (Addendum)
Cardiology Consultation:   Patient ID: Craig Perry; [id number]; 01-Feb-1961   Admit date: 11/18/2017 Date of Consult: 11/19/2017  Primary Care Provider: Patient, No Pcp Per Primary Cardiologist: Pt only saw NP 04/2016 in f/u and needed to establish -> will be Dr.Nikola Marone  Chief Complaint: chest pain  Patient Profile:   Craig Perry is a 57 y.o. male with a hx of CAD (STEMI 04/2016 s/p DES to RCA with significant residual disease in LAD), cocaine/alcohol abuse, alcoholic cardiomyopathy (EF 35% in 2014, then EF 40-45% by cath and 25-30% by echo 04/2016 at time of STEMI), HTN, prior stroke, noncompliance who is being seen today for the evaluation of chest pain at the request of Dr. Arbutus Leasat.  History of Present Illness:   He has a history of alcoholic cardiomyopathy EF 35% in 2014 with nonobstructive CAD at that time. In 04/2016 he was admitted for STEMI, treated with DES to the RCA x1. There was significant residual disease in the mid LAD and LVEF 45-50% with inferior hypokinesis. 2D echo that admission LVEF 25-30%. He was placed on Brilinta and aspirin. He was seen in follow-up once after that admission but never returned to the office. He did have a repeat stress test to evaluate the residual LAD disease 05/2016 which showed  "Large, severe intensity, apical to basal defect involving the inferoseptal, inferior, and inferolateral walls that is consistent with infarct scar. There is partial reversibility consistent with moderate peri-infarct ischemia," EF 43%. He did not return for follow-up to discuss. He was last seen by cardiology team 05/2017 during admission for chest pain/arm parasthesias after taking Viagra and partying with a friend drinking 18 beers. EKG was nonacute except for sinus tach but he r/o for MI. It was recommended that he resume meds for CAD and CHF. Lexiscan nuclear stress test showed large inferior wall infarct from apex to base, no ischemia, EF 30% with inferior wall hypokinesis.  He signed out AMA before the study could be reviewed or acted on by cardiology. In 06/2017 he was admitted to behavioral health with sucidal threats to cut his throat while intoxicated during argument with girlfriend.  He reports h/o crack cocaine use and states last use was 3 months ago. He states he no longer drinks like he did before but indicates this is now still about 6 beers per day. He has not been taking any medications for several months citing to inability to afford them. On Monday he was walking across parking lot at work and vomited so came home to rest. He states on Tuesday (yesterday) he developed intermittent chest discomfort with some arm tingling while getting ready for work. This story seems to vary slightly from admitting H/P where he had woken up with symptoms and had gone back to sleep, then recurred around 5am. The discomfort would last a few seconds to a minute then resolve spontaneously. Due to persistence of intermittent discomfort he came to ER where troponins neg x 5. Labs show preDM A1C 5.7, plt 132 today, LDL 86, trig 303. CXR c/w COPD, NAD. VSS except was hypertensive and tachycardic upon arrival (BP 161/103, HR 118). He received 324mg  ASA, lovenox (DVT ppx), metoprolol 25mg  BID, MVI, thiamine, folvite. He is overall feeling better currently.  Past Medical History:  Diagnosis Date  . Alcohol abuse   . CAD (coronary artery disease)    a. 2014 cath with non obst CAD, EF 35%.    b. 04/2016: inferior STEMI s/p DES to RCA. 70% LAD with  no intervention.  . Cocaine abuse (HCC)   . History of noncompliance with medical treatment, presenting hazards to health   . Hypertension   . Myocardial infarction (HCC)   . Pneumothorax   . Stroke [facility](HCC)     Past Surgical History:  Procedure Laterality Date  . ABDOMINAL SURGERY    . CARDIAC CATHETERIZATION N/A 04/20/2016   Procedure: Left Heart Cath and Coronary Angiography;  Surgeon: Marykay Lexavid W Harding, MD;  [facility];   Service: Cardiovascular;  Laterality: N/A;  . CARDIAC CATHETERIZATION N/A 04/20/2016   Procedure: Coronary Stent Intervention;  Surgeon: Marykay Lexavid W Harding, MD;  [facility];  Service: Cardiovascular;  Laterality: N/A;  . COLON SURGERY    . LEFT HEART CATHETERIZATION WITH CORONARY ANGIOGRAM N/A 03/08/2013   Procedure: LEFT HEART CATHETERIZATION WITH CORONARY ANGIOGRAM;  Surgeon: Micheline ChapmanMichael D Cooper, MD;  [facility];  Service: Cardiovascular;  Laterality: N/A;  . LIVER SURGERY    . SPLENECTOMY       Inpatient Medications: Scheduled Meds: . aspirin  325 mg Oral Daily  . enoxaparin (LOVENOX) injection  40 mg Subcutaneous Q24H  . folic acid  1 mg Oral Daily  . metoprolol tartrate  25 mg Oral BID  . multivitamin with minerals  1 tablet Oral Daily  . nicotine  21 mg Transdermal Daily  . thiamine  100 mg Oral Daily   Or  . thiamine  100 mg Intravenous Daily   Continuous Infusions:  PRN Meds: acetaminophen **OR** acetaminophen, LORazepam **OR** LORazepam, ondansetron **OR** ondansetron (ZOFRAN) IV  Home Meds: Prior to Admission medications   Not on File    Allergies:   No Known Allergies  Social History:   Social History   Socioeconomic History  . Marital status: Single    Spouse name: Not on file  . Number of children: Not on file  . Years of education: Not on file  . Highest education level: Not on file  Occupational History  . Not on file  Social Needs  . Financial resource strain: Not on file  . Food insecurity:    Worry: Not on file    Inability: Not on file  . Transportation needs:    Medical: Not on file    Non-medical: Not on file  Tobacco Use  . Smoking status: Current Every Day Smoker    Packs/day: 2.00    Years: 45.00    Pack years: 90.00    Types: Cigarettes  . Smokeless tobacco: Never Used  Substance and Sexual Activity  . Alcohol use: Yes    Comment: 6 beers a day.  formerly 18/day  . Drug use: Yes    Types: Cocaine  . Sexual  activity: Not Currently  Lifestyle  . Physical activity:    Days per week: Not on file    Minutes per session: Not on file  . Stress: Not on file  Relationships  . Social connections:    Talks on phone: Not on file    Gets together: Not on file    Attends religious service: Not on file    Active member of club or organization: Not on file    Attends meetings of clubs or organizations: Not on file    Relationship status: Not on file  . Intimate partner violence:    Fear of current or ex partner: Not on file    Emotionally abused: Not on file    Physically abused: Not on file  Forced sexual activity: Not on file  Other Topics Concern  . Not on file  Social History Narrative  . Not on file    Family History:   The patient's family history includes Diabetes in his father and mother; Heart attack in his father and mother; Stroke in his mother.  ROS:  Please see the history of present illness.  All other ROS reviewed and negative.     Physical Exam/Data:   Vitals:   11/18/17 1948 11/18/17 2235 11/18/17 2357 11/19/17 0553  BP: 115/74 118/69 119/84 140/83  Pulse: 82 75 77 86  Resp:   16 18  Temp: 97.7 F (36.5 C)  98 F (36.7 C) 97.7 F (36.5 C)  TempSrc: Oral  Oral Oral  SpO2: 94%  93% 99%  Weight:      Height:       No intake or output data in the 24 hours ending 11/19/17 0857 Filed Weights   11/18/17 0751  Weight: 65.8 kg   Body mass index is 20.22 kg/m.  General: Well developed thin WM in no acute distress. Heavy tobacco odor in room Head: Normocephalic, atraumatic, sclera non-icteric, no xanthomas, nares are without discharge.  Neck: Negative for carotid bruits. JVD not elevated. Lungs: Clear bilaterally to auscultation without wheezes, rales, or rhonchi. Breathing is unlabored. Heart: RRR with S1 S2. No murmurs, rubs, or gallops appreciated. Abdomen: Soft, non-tender, non-distended with normoactive bowel sounds. No hepatomegaly. No rebound/guarding. No  obvious abdominal masses. Msk:  Strength and tone appear normal for age. Extremities: No clubbing or cyanosis. No edema.  Distal pedal pulses are 2+ and equal bilaterally. Neuro: Alert and oriented X 3. No facial asymmetry. No focal deficit. Moves all extremities spontaneously. Psych:  Responds to questions appropriately with a normal affect.  EKG:  The EKG was personally reviewed and demonstrates NSR nonspecific STT changes with ST sagging in V5-V6 but not markedly changed from 05/2017  Laboratory Data:  Chemistry Recent Labs  Lab 11/18/17 0805  NA 138  K 4.1  CL 103  CO2 26  GLUCOSE 119*  BUN 10  CREATININE 0.84  CALCIUM 9.4  GFRNONAA >60  GFRAA >60  ANIONGAP 9    No results for input(s): PROT, ALBUMIN, AST, ALT, ALKPHOS, BILITOT in the last 168 hours. Hematology Recent Labs  Lab 11/18/17 0805 11/19/17 0515  WBC 5.3 5.7  RBC 5.62 5.25  HGB 17.5* 16.1  HCT 50.4 47.6  MCV 89.7 90.7  MCH 31.1 30.7  MCHC 34.7 33.8  RDW 15.2 15.3  PLT 150 132*   Cardiac Enzymes Recent Labs  Lab 11/18/17 1225 11/18/17 1745 11/19/17 0003  TROPONINI <0.03 <0.03 <0.03    Recent Labs  Lab 11/18/17 0821 11/18/17 1025  TROPIPOC 0.03 0.01    Radiology/Studies:  Dg Chest 2 View  Result Date: 11/18/2017 CLINICAL DATA:  Chest pain. EXAM: CHEST - 2 VIEW COMPARISON:  Chest x-ray dated June 17, 2017. FINDINGS: The heart size and mediastinal contours are within normal limits. Normal pulmonary vascularity. The lungs remain hyperinflated with emphysematous changes. No focal consolidation, pleural effusion, or pneumothorax. Unchanged nipple shadows. No acute osseous abnormality. IMPRESSION: COPD.  No active cardiopulmonary disease. Electronically Signed   By: Obie Dredge M.D.   On: 11/18/2017 09:01    Assessment and Plan:   1. Intermittent chest pain 2. Known CAD as above 3. Alcoholic cardiomyopathy with chronic systolic dysfunction 4. Habitual noncompliance 5. Ongoing alcohol  abuse 6. Crack cocaine use (last used 3 mo ago per  patient) 7. HTN, now controlled  Challenging situation. Mr. Varkey biggest threat to himself is the ongoing alcohol abuse, intermittent crack cocaine use and medication noncompliance. He has not demonstrated ability to take medications reliably or follow up in the outpatient setting. Because of this I do not believe he is a candidate for invasive ischemic evaluation - even if significant obstruction were found, if he were to require DAPT and stop taking it, this could put him at significant risk for acute stent thrombosis. He appears euvolemic at present time. Nuclear stress test 05/2017 showed scar but no ischemia. Will discuss further eval with Dr. Wyline Mood. Check UDS as there was some hesitation in admitting to cocaine use. May be beneficial to use carvedilol instead of Lopressor in that case. We discussed the life threatening risk of his behaviors and the interaction in cardiac meds with cocaine. Will order social work and care management consult as these would be more helpful than just the prescriptions themselves. Consider addition of long acting nitrate. I don't see a specific need for higher dose ASA, so would consider reducing to 81mg  daily unless IM has him on this for another reason. Would also consider addition of statin.    For questions or updates, please contact [facility] Please consult [email or web address] for contact info under Cardiology/STEMI.    Signed, Laurann Montana, PA-C  11/19/2017 8:57 AM   Attending note Patient seen and discussed with PA Dunn, I agree with her documentation above. 57 yo male history of CAD with prior inferior STEMI s/p DES to RCA with residual LAD disease, chronic systolic HF, EtoH abuse. There has been prior issues with compliance with medical appointments, medication compliance. He stopped his brillinta after his previous stent after 6 months on his own. Notes indicate leaving a prior admission AMA prior to  stress test results being available.   Presents with chest pain. Stabbing like pain mid to left chest, lasting just a few seconds. Ongoing for last few days, No other associated symptoms. Last episode last night while laying in bed, somewhat positional last night.    05/2017 nuclear stress: large inreior infarct, no ischemia. High risk due to LVEF 30% Jan 2018 echo LVEF 25-30%, diffuse hypokinesis, grade I diastolic dysfunction  WBC 5.3 Hgb 17.5 Plt 150 K 4.1 Cr 0.84  Trop neg x 5 CXR no acute process EKG sinus tach, no acute ischemic changes  Patient with known history of CAD with prior MI presents with chest pain. Historically his care has been complicated by polysubstance abuse, noncompliance with meds including DAPT, poor compliance with follow up and previously leaving admissions AMA before workups completed. At this time no strong objective evidence of ischemia by EKG or enzymes, echo is pending. UDS is pending, patient gives history of crack cocaine use. Nuclear stress 05/2017 without ischemia. He is a poor candidate for cath due to noncompliance and previous failure to take DAPT as prescribed, would not consider cath except in the highest risk situation. At this time would treat medically. Will start diet this AM, likely can d/c later today after echo. Change ASA to 81mg , change lopressor to coreg 3.125mg  bid in setting of systolic dysfunction (cocaine interaction with beta blockers is theoretical, it is reasonable to use beta blocker particularly with alpha and beta properties). No ACE/ARB at this time due to soft bp's at times, can reconsider at f/u. Start atorva 40mg  daily.    Dina Rich MD

## 2017-11-19 NOTE — Progress Notes (Signed)
*  PRELIMINARY RESULTS* Echocardiogram 2D Echocardiogram has been performed.  Craig Perry, Craig Perry 11/19/2017, 1:53 PM

## 2017-11-19 NOTE — Progress Notes (Signed)
Confirmed that secretary had called cardiology. The consult is in the book and was called at 0803 on 11/19/2017

## 2017-11-19 NOTE — Progress Notes (Signed)
Removed IV-clean, dry, intact. Reviewed d/c paperwork with patient. Reviewed new medications. Answered all questions. Walked stable patient and belongings to main entrance where he was waiting for friend to pick up.

## 2017-11-19 NOTE — Discharge Summary (Signed)
Physician Discharge Summary  Era Skeenhomas W Oceguera YQM:[id number] DOB: 06/29/1960 DOA: 11/18/2017  PCP: Patient, No Pcp Per  Admit date: 11/18/2017 Discharge date: 11/19/2017  Time spent: 45 minutes  Recommendations for Outpatient Follow-up:  -Will be discharged home today. -Follow up has been arranged with cardiology on 9/23.   Discharge Diagnoses:  Active Problems:   Chest pain   Tobacco abuse   Alcoholic cardiomyopathy (HCC)   Hypertension   Alcohol use disorder, moderate, dependence (HCC)   Angina pectoris (HCC)   Discharge Condition: Stable and improved  Filed Weights   11/18/17 0751  Weight: 65.8 kg    History of present illness:  As per Dr. Arbutus Leasat on 8/20: Craig Perry is a 57 y.o. 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  Patient states that he had some chest discomfort and nausea and tingling in his bilateral lower extremities at work on 11/17/2017.  He denies any worsening shortness of breath, fevers, chills, cough, hemoptysis. He states that he completed a six-month course of antiplatelet agents, but soon thereafter was not able to afford any of his medications and has not taken any of them since then.  In fact, the patient has not taken any aspirin whatsoever for at least 6 months.  He woke up around 3 AM on 11/18/2017 with some chest discomfort and tingling in his arms.  It resolved after a few minutes.  He went back to sleep.  He woke up around 5 AM with chest discomfort again and some nausea.  As result, he presented to emergency department for further evaluation.  In the emergency department, the patient was afebrile hemodynamically stable saturating 96% on room air.  BMP and CBC were unremarkable  except for platelet of 150,000.  Chest x-ray was negative for acute findings.  Initial point-of-care troponins were negative.  EKG shows sinus rhythm without any ST-T wave changes.  Hospital Course:   Chest pain -In a patient with history of alcoholic cardiomyopathy with a known ejection fraction of 30%, tobacco abuse, hypertension, severe two-vessel coronary artery disease with inferior ST elevated MI and placement of a drug-eluting stent to RCA in January 2018. -He has a history of medical noncompliance and in fact took himself off Brilinta after only 6 months.  He has not been taking any other medications.  He was admitted to the hospital in March 2019 and had a stress test but left AMA prior to results.  That was a high risk study due to an ejection fraction of 30%. -He has now ruled out for ACS, denies chest pain. -Has been seen in consultation by cardiology who is recommending medical management at this point, they would only do cardiac catheterization in very high risk situations given his history of medical noncompliance. -Will be started on aspirin, atorvastatin, carvedilol. -Discussed with cardiology initiation of ACE inhibitor/ARB/Arni, these will not be started at this time due to necessity of close monitoring of renal function and this patient is known to be noncompliant.  If he follows through with his outpatient appointments consideration can be given at that time to initiate 1 of these medications.   Procedures:  None   Consultations:  Cardiology  Discharge Instructions  Discharge Instructions    Diet - low sodium heart healthy   Complete  by:  As directed    Increase activity slowly   Complete by:  As directed      Allergies as of 11/19/2017   No Known Allergies     Medication List    TAKE these medications   aspirin 81 MG EC tablet Take 1 tablet (81 mg total) by mouth daily. Start taking on:  11/20/2017   atorvastatin 40 MG tablet Commonly known as:   LIPITOR Take 1 tablet (40 mg total) by mouth daily at 6 PM.   carvedilol 3.125 MG tablet Commonly known as:  COREG Take 1 tablet (3.125 mg total) by mouth 2 (two) times daily with a meal.   folic acid 1 MG tablet Commonly known as:  FOLVITE Take 1 tablet (1 mg total) by mouth daily. Start taking on:  11/20/2017   multivitamin with minerals Tabs tablet Take 1 tablet by mouth daily. Start taking on:  11/20/2017      No Known Allergies Follow-up Information    Dyann KiefLenze, Michele M, PA-C Follow up.   Specialty:  Cardiology Why:  [facility] - [facility] - 12/22/17 at 11:30am. Arrive 15 minutes prior to appointment to check in. Elon JesterMichele is one of the PAs that works with Dr. Wyline MoodBranch and the cardiology team. Contact information: [address] [place] [id number] [phone]            The results of significant diagnostics from this hospitalization (including imaging, microbiology, ancillary and laboratory) are listed below for reference.    Significant Diagnostic Studies: Dg Chest 2 View  Result Date: 11/18/2017 CLINICAL DATA:  Chest pain. EXAM: CHEST - 2 VIEW COMPARISON:  Chest x-ray dated June 17, 2017. FINDINGS: The heart size and mediastinal contours are within normal limits. Normal pulmonary vascularity. The lungs remain hyperinflated with emphysematous changes. No focal consolidation, pleural effusion, or pneumothorax. Unchanged nipple shadows. No acute osseous abnormality. IMPRESSION: COPD.  No active cardiopulmonary disease. Electronically Signed   By: Obie DredgeWilliam T Derry M.D.   On: 11/18/2017 09:01    Microbiology: No results found for this or any previous visit (from the past 240 hour(s)).   Labs: Basic Metabolic Panel: Recent Labs  Lab 11/18/17 0805  NA 138  K 4.1  CL 103  CO2 26  GLUCOSE 119*  BUN 10  CREATININE 0.84  CALCIUM 9.4   Liver Function Tests: No results for input(s): AST, ALT, ALKPHOS, BILITOT, PROT, ALBUMIN in the last 168  hours. No results for input(s): LIPASE, AMYLASE in the last 168 hours. No results for input(s): AMMONIA in the last 168 hours. CBC: Recent Labs  Lab 11/18/17 0805 11/19/17 0515  WBC 5.3 5.7  HGB 17.5* 16.1  HCT 50.4 47.6  MCV 89.7 90.7  PLT 150 132*   Cardiac Enzymes: Recent Labs  Lab 11/18/17 1225 11/18/17 1745 11/19/17 0003  TROPONINI <0.03 <0.03 <0.03   BNP: BNP (last 3 results) No results for input(s): BNP in the last 8760 hours.  ProBNP (last 3 results) No results for input(s): PROBNP in the last 8760 hours.  CBG: No results for input(s): GLUCAP in the last 168 hours.     Signed:  Chaya Janstela Hernandez Acosta  [facility] Pager: [phone] 11/19/2017, 3:42 PM

## 2017-11-19 NOTE — Clinical Social Work Note (Signed)
Clinical Social Work Assessment  Patient Details  Name: Craig Perry MRN: [id number] Date of Birth: June 23, 1960  Date of referral:  11/19/17               Reason for consult:  Substance Use/ETOH Abuse                Permission sought to share information with:    Permission granted to share information::     Name::        Agency::     Relationship::     Contact Information:     Housing/Transportation Living arrangements for the past 2 months:  Single Family Home Source of Information:  Patient Patient Interpreter Needed:  None Criminal Activity/Legal Involvement Pertinent to Current Situation/Hospitalization:  No - Comment as needed Significant Relationships:  Significant Other Lives with:  Significant Other Do you feel safe going back to the place where you live?  Yes Need for family participation in patient care:  Yes (Comment)  Care giving concerns:  None identified.   Social Worker assessment / plan: Patient was provided multiple inpatient and out patient substance/alcohol abuse resources/treatments.  Resources were review with patient. Patient stated "not really" when asked if he desired treatment.  He stated "I can handle that by myself. I done slowed down a lot." Patient detailed that he does not drink as much alcohol as he use to drink. Patient stated that he would review the information. Patient indicated that he has attended AA meeting in the past and found them to be beneficial.   LCSW signing off.   Employment status:  Unemployed Health and safety inspectornsurance information:  Self Pay (Medicaid Pending) PT Recommendations:  Not assessed at this time Information / Referral to community resources:  Outpatient Substance Abuse Treatment Options, Residential Substance Abuse Treatment Options  Patient/Family's Response to care: Patient is not yet ready for treatment.    Patient/Family's Understanding of and Emotional Response to Diagnosis, Current Treatment, and Prognosis:  Patient  understands his diagnosis, treatment and prognosis.   Emotional Assessment Appearance:  Appears stated age Attitude/Demeanor/Rapport:    Affect (typically observed):  Calm Orientation:  Oriented to Self, Oriented to Place, Oriented to  Time, Oriented to Situation Alcohol / Substance use:  Alcohol Use Psych involvement (Current and /or in the community):  No (Comment)  Discharge Needs  Concerns to be addressed:  Substance Abuse Concerns Readmission within the last 30 days:  Yes Current discharge risk:  None Barriers to Discharge:  Active Substance Use   Annice NeedySettle, Shateria Paternostro D, LCSW 11/19/2017, 3:23 PM

## 2017-12-17 NOTE — Progress Notes (Signed)
Cardiology Office Note    Date:  12/22/2017   ID:  Craig Perry 1960-11-16, MRN [id number]  PCP:  Patient, No Pcp Per  Cardiologist: Dina Rich, MD EPS: None  No chief complaint on file.   History of Present Illness:  Craig Perry is a 57 y.o. 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 Nuclear stress test 05/2017 scar, no ischemia.   Admitted 11/19/17 with chest pain in setting of alcohol abuse and intermittent crack cocaine use, medication noncompliance with stopping dual antiplatelet therapy 6 months after stent, several admissions leaving AMA before work-up complete.  Lopressor was changed to Coreg 3.125 mg twice daily in the setting of systolic dysfunction. Dr. Wyline Mood indicated  cocaine interaction with beta-blockers is theoretical and it was reasonable to use.  Consider ACE/ARB follow-up.  Blood pressure was soft at times.  2D echo 11/19/2017 LVEF 35%.  Patient comes in today accompanied by his 98-1/2-year-old granddaughter who he is raising.  He continues to have chest pain 10-20 times per day.  Has not used nitroglycerin.  Smokes a pack a day since he is cut back on his alcohol.  Used to drink 2-12 packs of beer a day but says his last 12 pack is last him 3 days.  Does not do any exertion.  Going to apply for disability.  Says he is taking his medications.  Needs fresh nitroglycerin.     Past Medical History:  Diagnosis Date  . Alcohol abuse   . CAD (coronary artery disease)    a. 2014 cath with non obst CAD, EF 35%.    b. 04/2016: inferior STEMI s/p DES to RCA. 70% LAD with no intervention.  . Cocaine abuse (HCC)   . History of noncompliance with medical treatment, presenting hazards to health   . Hypertension   . Myocardial infarction (HCC)   . Pneumothorax   .  Stroke [facility])     Past Surgical History:  Procedure Laterality Date  . ABDOMINAL SURGERY    . CARDIAC CATHETERIZATION N/A 04/20/2016   Procedure: Left Heart Cath and Coronary Angiography;  Surgeon: Marykay Lex, MD;  Location: [facility] INVASIVE CV LAB;  Service: Cardiovascular;  Laterality: N/A;  . CARDIAC CATHETERIZATION N/A 04/20/2016   Procedure: Coronary Stent Intervention;  Surgeon: Marykay Lex, MD;  Location: [facility] INVASIVE CV LAB;  Service: Cardiovascular;  Laterality: N/A;  . COLON SURGERY    . LEFT HEART CATHETERIZATION WITH CORONARY ANGIOGRAM N/A 03/08/2013   Procedure: LEFT HEART CATHETERIZATION WITH CORONARY ANGIOGRAM;  Surgeon: Micheline Chapman, MD;  Location: [facility] CATH LAB;  Service: Cardiovascular;  Laterality: N/A;  . LIVER SURGERY    . SPLENECTOMY      Current Medications: Current Meds  Medication Sig  . aspirin EC 81 MG EC tablet Take 1 tablet (81 mg total) by mouth daily.  Marland Kitchen atorvastatin (LIPITOR) 40 MG tablet Take 1 tablet (40 mg total) by mouth daily at 6 PM.  . folic acid (FOLVITE) 1 MG tablet Take 1 tablet (1 mg total) by mouth daily.  . Multiple Vitamin (MULTIVITAMIN WITH MINERALS) TABS tablet Take 1 tablet by mouth daily.  . [DISCONTINUED] carvedilol (COREG) 3.125 MG tablet Take 1 tablet (3.125 mg total) by mouth 2 (two) times daily with a meal.     Allergies:  Patient has no known allergies.   Social History   Socioeconomic History  . Marital status: Single    Spouse name: Not on file  . Number of children: Not on file  . Years of education: Not on file  . Highest education level: Not on file  Occupational History  . Not on file  Social Needs  . Financial resource strain: Not on file  . Food insecurity:    Worry: Not on file    Inability: Not on file  . Transportation needs:    Medical: Not on file    Non-medical: Not on file  Tobacco Use  . Smoking status: Current Every Day Smoker    Packs/day: 2.00    Years: 45.00    Pack years: 90.00    Types:  Cigarettes  . Smokeless tobacco: Never Used  Substance and Sexual Activity  . Alcohol use: Yes    Comment: 6 beers a day.  formerly 18/day  . Drug use: Yes    Types: Cocaine  . Sexual activity: Not Currently  Lifestyle  . Physical activity:    Days per week: Not on file    Minutes per session: Not on file  . Stress: Not on file  Relationships  . Social connections:    Talks on phone: Not on file    Gets together: Not on file    Attends religious service: Not on file    Active member of club or organization: Not on file    Attends meetings of clubs or organizations: Not on file    Relationship status: Not on file  Other Topics Concern  . Not on file  Social History Narrative  . Not on file     Family History:  The patient's family history includes Diabetes in his father and mother; Heart attack in his father and mother; Stroke in his mother.   ROS:   Please see the history of present illness.    Review of Systems  Cardiovascular: Positive for chest pain and dyspnea on exertion.   All other systems reviewed and are negative.   PHYSICAL EXAM:   VS:  BP 122/68 (BP Location: Left Arm)   Pulse 84   Ht 5' 11.5" (1.816 m)   Wt 152 lb (68.9 kg)   SpO2 97%   BMI 20.90 kg/m   Physical Exam  GEN: Thin, looks older than stated age, smells of cigarettes, in no acute distress  Neck: no JVD, carotid bruits, or masses Cardiac:RRR; no murmurs, rubs, or gallops  Respiratory: Decreased breath sounds without rales GI: soft, nontender, nondistended, + BS Ext: without cyanosis, clubbing, or edema, Good distal pulses bilaterally Neuro:  Alert and Oriented x 3 Psych: euthymic mood, full affect  Wt Readings from Last 3 Encounters:  12/22/17 152 lb (68.9 kg)  11/18/17 145 lb (65.8 kg)  07/20/17 160 lb (72.6 kg)      Studies/Labs Reviewed:   EKG:  EKG is not ordered today.  Recent Labs: 07/20/2017: ALT 10 07/22/2017: TSH 3.324 11/18/2017: BUN 10; Creatinine, Ser 0.84; Potassium  4.1; Sodium 138 11/19/2017: Hemoglobin 16.1; Platelets 132   Lipid Panel    Component Value Date/Time   CHOL 183 11/19/2017 0515   TRIG 303 (H) 11/19/2017 0515   HDL 36 (L) 11/19/2017 0515   CHOLHDL 5.1 11/19/2017 0515   VLDL 61 (H) 11/19/2017 0515   LDLCALC 86 11/19/2017 0515    Additional studies/ records that were reviewed today include:  2D echo 11/19/2017 Study Conclusions   -  Left ventricle: The cavity size was normal. Wall thickness was   increased in a pattern of mild LVH. Systolic function was   moderately to severely reduced. The estimated ejection fraction   was = 35%. The study is not technically sufficient to allow   evaluation of LV diastolic function. - Aortic valve: Mildly calcified annulus. Normal thickness   leaflets. Valve area (VTI): 2.17 cm^2. Valve area (Vmax): 2.8   cm^2. - Atrial septum: No defect or patent foramen ovale was identified. - Technically difficult study.    ASSESSMENT:    1. Alcoholic cardiomyopathy (HCC)   2. Coronary artery disease involving native coronary artery of native heart without angina pectoris   3. Essential hypertension   4. ETOH abuse   5. Tobacco abuse      PLAN:  In order of problems listed above:  Alcoholic cardiomyopathy most recent ejection fraction 35% on echo 11/19/2017-compensated without heart failure.  Will increase carvedilol to 6.25 mg twice daily.  Follow-up with Dr. Wyline Mood in 2 months.  No ACE inhibitor or ARB at this time because of soft blood pressures at times.  CAD status post STEMI 04/2016 treated with DES to the RCA with significant residual disease in the LAD.  History of noncompliance with stopping dual antiplatelet therapy 6 months after stent.  Now back on low-dose aspirin.  Essential hypertension blood pressure runs on the low side.  Stable today.  Alcohol abuse/Cocaine abuse says he has not used cocaine in several months.  Is trying to cut back on his alcohol consumption.  Tobacco abuse  continues to smoke a pack a day back on this.  Smoking cessation discussed.  Medication Adjustments/Labs and Tests Ordered: Current medicines are reviewed at length with the patient today.  Concerns regarding medicines are outlined above.  Medication changes, Labs and Tests ordered today are listed in the Patient Instructions below. There are no Patient Instructions on file for this visit.   Elson Clan, PA-C  12/22/2017 11:24 AM    [facility] [address] [place], [place], [place]  [address] Phone: [phone]; Fax: [phone]

## 2017-12-22 ENCOUNTER — Ambulatory Visit (INDEPENDENT_AMBULATORY_CARE_PROVIDER_SITE_OTHER): Payer: Self-pay | Admitting: Physician Assistant

## 2017-12-22 ENCOUNTER — Encounter: Payer: Self-pay | Admitting: Physician Assistant

## 2017-12-22 VITALS — BP 122/68 | HR 84 | Ht 71.5 in | Wt 152.0 lb

## 2017-12-22 DIAGNOSIS — Z720 Tobacco use: Secondary | ICD-10-CM

## 2017-12-22 DIAGNOSIS — I426 Alcoholic cardiomyopathy: Secondary | ICD-10-CM

## 2017-12-22 DIAGNOSIS — I2510 Atherosclerotic heart disease of native coronary artery without angina pectoris: Secondary | ICD-10-CM

## 2017-12-22 DIAGNOSIS — F1010 Alcohol abuse, uncomplicated: Secondary | ICD-10-CM

## 2017-12-22 DIAGNOSIS — I10 Essential (primary) hypertension: Secondary | ICD-10-CM

## 2017-12-22 MED ORDER — CARVEDILOL 6.25 MG PO TABS: 6.2500 mg | ORAL_TABLET | Freq: Two times a day (BID) | ORAL | 3 refills | Status: DC

## 2017-12-22 NOTE — Patient Instructions (Addendum)
Medication Instructions:  Your physician has recommended you make the following change in your medication:  Increase Coreg to 6.25 mg Two Times Daily    Labwork: NONE   Testing/Procedures: NONE   Follow-Up: Your physician recommends that you schedule a follow-up appointment in: 2 Months with Dr. Wyline MoodBranch    Any Other Special Instructions Will Be Listed Below (If Applicable). Your physician discussed the hazards of tobacco use. Tobacco use cessation is recommended and techniques and options to help you quit were discussed.  Stop drinking alcohol     If you need a refill on your cardiac medications before your next appointment, please call your pharmacy.  Thank you for choosing [facility]!

## 2018-02-18 NOTE — Progress Notes (Signed)
Cardiology Office Note    Date:  02/23/2018   ID:  Craig Perry, Craig Perry 04/10/1960, MRN [id number]  PCP:  Patient, No Pcp Per  Cardiologist: Dina Rich, MD EPS: None  Chief Complaint  Patient presents with  . Follow-up    History of Present Illness:  Craig Perry is a 57 y.o. 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 Nuclear stress test 05/2017 scar, no ischemia.    Admitted 11/19/17 with chest pain in setting of alcohol abuse and intermittent crack cocaine use, medication noncompliance with stopping dual antiplatelet therapy 6 months after stent, several admissions leaving AMA before work-up complete.  Lopressor was changed to Coreg 3.125 mg twice daily in the setting of systolic dysfunction. Dr. Wyline Mood indicated  cocaine interaction with beta-blockers is theoretical and it was reasonable to use.  Consider ACE/ARB follow-up.  Blood pressure was soft at times.  2D echo 11/19/2017 LVEF 35%.   I saw the patient 12/22/2017 at which time he was having chest pain 10-20 times a day.  Had not tried nitroglycerin.  He continued to smoke and was trying to cut back on his alcohol drinking 12 pack every 3 days. I increased coreg to 6.25 mg bid.  Patient comes in today accompanied by his granddaughter.  He complains of chest pain and bilateral arm pain for about 5 days straight.  He ran out of his carvedilol a week ago and the chest pain went away.  He got refills and took a carvedilol yesterday morning and felt fine.  He did not take any last night or this morning.  Blood pressure and pulse are both up today.  Complains of Lipitor keeping him up at night.    Past Medical History:  Diagnosis Date  . Alcohol abuse   . CAD (coronary artery disease)    a. 2014 cath with non obst CAD, EF 35%.     b. 04/2016: inferior STEMI s/p DES to RCA. 70% LAD with no intervention.  . Cocaine abuse (HCC)   . History of noncompliance with medical treatment, presenting hazards to health   . Hypertension   . Myocardial infarction (HCC)   . Pneumothorax   . Stroke [facility])     Past Surgical History:  Procedure Laterality Date  . ABDOMINAL SURGERY    . CARDIAC CATHETERIZATION N/A 04/20/2016   Procedure: Left Heart Cath and Coronary Angiography;  Surgeon: Marykay Lex, MD;  Location: [facility] INVASIVE CV LAB;  Service: Cardiovascular;  Laterality: N/A;  . CARDIAC CATHETERIZATION N/A 04/20/2016   Procedure: Coronary Stent Intervention;  Surgeon: Marykay Lex, MD;  Location: [facility] INVASIVE CV LAB;  Service: Cardiovascular;  Laterality: N/A;  . COLON SURGERY    . LEFT HEART CATHETERIZATION WITH CORONARY ANGIOGRAM N/A 03/08/2013   Procedure: LEFT HEART CATHETERIZATION WITH CORONARY ANGIOGRAM;  Surgeon: Micheline Chapman, MD;  Location: [facility] CATH LAB;  Service: Cardiovascular;  Laterality: N/A;  . LIVER SURGERY    . SPLENECTOMY      Current Medications: Current Meds  Medication Sig  . aspirin EC 81 MG EC tablet Take 1 tablet (81 mg total) by mouth daily.  Marland Kitchen atorvastatin (LIPITOR) 40 MG tablet Take 1 tablet (40 mg total) by mouth daily at 6 PM.  . carvedilol (COREG) 6.25 MG tablet Take 1 tablet (6.25  mg total) by mouth 2 (two) times daily.  . folic acid (FOLVITE) 1 MG tablet Take 1 tablet (1 mg total) by mouth daily.  . Multiple Vitamin (MULTIVITAMIN WITH MINERALS) TABS tablet Take 1 tablet by mouth daily.     Allergies:   Patient has no known allergies.   Social History   Socioeconomic History  . Marital status: Single    Spouse name: Not on file  . Number of children: Not on file  . Years of education: Not on file  . Highest education level: Not on file  Occupational History  . Not on file  Social Needs  . Financial resource strain: Not on file  . Food insecurity:    Worry: Not on file     Inability: Not on file  . Transportation needs:    Medical: Not on file    Non-medical: Not on file  Tobacco Use  . Smoking status: Current Every Day Smoker    Packs/day: 2.00    Years: 45.00    Pack years: 90.00    Types: Cigarettes  . Smokeless tobacco: Never Used  Substance and Sexual Activity  . Alcohol use: Yes    Comment: 6 beers a day.  formerly 18/day  . Drug use: Yes    Types: Cocaine  . Sexual activity: Not Currently  Lifestyle  . Physical activity:    Days per week: Not on file    Minutes per session: Not on file  . Stress: Not on file  Relationships  . Social connections:    Talks on phone: Not on file    Gets together: Not on file    Attends religious service: Not on file    Active member of club or organization: Not on file    Attends meetings of clubs or organizations: Not on file    Relationship status: Not on file  Other Topics Concern  . Not on file  Social History Narrative  . Not on file     Family History:  The patient's family history includes Diabetes in his father and mother; Heart attack in his father and mother; Stroke in his mother.   ROS:   Please see the history of present illness.    Review of Systems  Constitution: Negative.  HENT: Negative.   Cardiovascular: Positive for chest pain and dyspnea on exertion.  Respiratory: Negative.   Endocrine: Negative.   Hematologic/Lymphatic: Negative.   Musculoskeletal: Negative.   Gastrointestinal: Negative.   Genitourinary: Negative.   Neurological: Negative.    All other systems reviewed and are negative.   PHYSICAL EXAM:   VS:  BP (!) 142/96   Pulse (!) 108   Ht 5\' 11"  (1.803 m)   Wt 156 lb (70.8 kg)   SpO2 94%   BMI 21.76 kg/m   Physical Exam  GEN: Thin, in no acute distress  Neck: no JVD, carotid bruits, or masses Cardiac:RRR; positive S4, 1/6 systolic murmur at the left sternal border  Respiratory: Decreased breath sounds throughout GI: soft, nontender, nondistended, +  BS Ext: without cyanosis, clubbing, or edema, Good distal pulses bilaterally Neuro:  Alert and Oriented x 3 Psych: euthymic mood, full affect  Wt Readings from Last 3 Encounters:  02/23/18 156 lb (70.8 kg)  12/22/17 152 lb (68.9 kg)  11/18/17 145 lb (65.8 kg)      Studies/Labs Reviewed:   EKG:  EKG is not ordered today.   Recent Labs: 07/20/2017: ALT 10 07/22/2017: TSH 3.324 11/18/2017: BUN 10; Creatinine,  Ser 0.84; Potassium 4.1; Sodium 138 11/19/2017: Hemoglobin 16.1; Platelets 132   Lipid Panel    Component Value Date/Time   CHOL 183 11/19/2017 0515   TRIG 303 (H) 11/19/2017 0515   HDL 36 (L) 11/19/2017 0515   CHOLHDL 5.1 11/19/2017 0515   VLDL 61 (H) 11/19/2017 0515   LDLCALC 86 11/19/2017 0515    Additional studies/ records that were reviewed today include:   2D echo 11/19/2017 Study Conclusions   - Left ventricle: The cavity size was normal. Wall thickness was   increased in a pattern of mild LVH. Systolic function was   moderately to severely reduced. The estimated ejection fraction   was = 35%. The study is not technically sufficient to allow   evaluation of LV diastolic function. - Aortic valve: Mildly calcified annulus. Normal thickness   leaflets. Valve area (VTI): 2.17 cm^2. Valve area (Vmax): 2.8   cm^2. - Atrial septum: No defect or patent foramen ovale was identified. - Technically difficult study.        ASSESSMENT:    1. Alcoholic cardiomyopathy (HCC)   2. Coronary artery disease involving native coronary artery, angina presence unspecified, unspecified whether native or transplanted heart   3. Essential hypertension   4. ETOH abuse   5. Tobacco abuse      PLAN:  In order of problems listed above:  Alcoholic cardiomyopathy ejection fraction 35% on echo 11/19/2017 I increased his carvedilol last office visit to 6.25 mg twice daily.  Could not add ACE inhibitor or ARB because of soft blood pressures.  Patient is only taking his carvedilol  intermittently.  Blood pressure and pulse were both elevated today.  I have asked him to take this consistently to help improve his LV function.  Patient will come back on Monday for blood pressure and pulse check  CAD status post STEMI 04/2016 treated with DES to the RCA with significant residual disease in the LAD.  History of noncompliance he stopped his dual antiplatelet therapy 6 months after stenting.  Now he is treated with low-dose aspirin.  Patient had 6 days of chest pain that he thought was related to carvedilol.  He ran out of it and felt better but took it yesterday without problems.  Resume carvedilol.  He is not doing cocaine.  Call us if he has recurrent chest pain on carvedilol and will switch his medications.  Follow-up with Dr. Wyline Mood.  Essential hypertension blood pressure is up today has not taken carvedilol last night or this morning.  Will resume and recheck next Monday.  Alcohol abuse/cocaine abuse has decreased his alcohol to 12 pack every 4 days.  No longer using cocaine.  Tobacco abuse has cut back from 2 packs a day to 1 pack/day.      Medication Adjustments/Labs and Tests Ordered: Current medicines are reviewed at length with the patient today.  Concerns regarding medicines are outlined above.  Medication changes, Labs and Tests ordered today are listed in the Patient Instructions below. Patient Instructions  Medication Instructions:  PLEASE TAKE COREG 6.25 MG TWO TIMES DAILY   Labwork: NONE  Testing/Procedures: NONE  Follow-Up: Your physician recommends that you schedule a follow-up appointment in: 1 WEEK NURSE VISIT  Your physician recommends that you schedule a follow-up appointment in:  January 2020 WITH DR. BRANCH     Any Other Special Instructions Will Be Listed Below (If Applicable).     If you need a refill on your cardiac medications before your next appointment, please call  your pharmacy.      Elson ClanSigned, Zymier Rodgers, PA-C  02/23/2018  11:13 AM    [facility] [address], [place], [place]  [address] Phone: [phone]; Fax: [phone]

## 2018-02-23 ENCOUNTER — Encounter: Payer: Self-pay | Admitting: Physician Assistant

## 2018-02-23 ENCOUNTER — Ambulatory Visit (INDEPENDENT_AMBULATORY_CARE_PROVIDER_SITE_OTHER): Payer: Self-pay | Admitting: Physician Assistant

## 2018-02-23 VITALS — BP 142/96 | HR 108 | Ht 71.0 in | Wt 156.0 lb

## 2018-02-23 DIAGNOSIS — Z720 Tobacco use: Secondary | ICD-10-CM

## 2018-02-23 DIAGNOSIS — F1010 Alcohol abuse, uncomplicated: Secondary | ICD-10-CM

## 2018-02-23 DIAGNOSIS — I10 Essential (primary) hypertension: Secondary | ICD-10-CM

## 2018-02-23 DIAGNOSIS — I2510 Atherosclerotic heart disease of native coronary artery without angina pectoris: Secondary | ICD-10-CM

## 2018-02-23 DIAGNOSIS — I426 Alcoholic cardiomyopathy: Secondary | ICD-10-CM

## 2018-02-23 NOTE — Patient Instructions (Signed)
Medication Instructions:  PLEASE TAKE COREG 6.25 MG TWO TIMES DAILY   Labwork: NONE  Testing/Procedures: NONE  Follow-Up: Your physician recommends that you schedule a follow-up appointment in: 1 WEEK NURSE VISIT  Your physician recommends that you schedule a follow-up appointment in:  January 2020 WITH DR. BRANCH     Any Other Special Instructions Will Be Listed Below (If Applicable).     If you need a refill on your cardiac medications before your next appointment, please call your pharmacy.

## 2018-03-02 ENCOUNTER — Ambulatory Visit (INDEPENDENT_AMBULATORY_CARE_PROVIDER_SITE_OTHER): Payer: Self-pay

## 2018-03-02 VITALS — BP 118/78 | HR 74 | Ht 71.0 in | Wt 158.0 lb

## 2018-03-02 DIAGNOSIS — Z0130 Encounter for examination of blood pressure without abnormal findings: Secondary | ICD-10-CM

## 2018-03-02 NOTE — Progress Notes (Signed)
Patient is back on Coreg 6.25 mg BID   He is here for BP/HR check and to speak with M.Lenze PA-C

## 2018-03-02 NOTE — Patient Instructions (Addendum)
No change in medications    Follow up as planned with dr.branch on 04/29/2018  At 9 am [place] office as planned

## 2018-04-29 ENCOUNTER — Encounter: Payer: Self-pay | Admitting: Cardiology

## 2018-04-29 ENCOUNTER — Ambulatory Visit (INDEPENDENT_AMBULATORY_CARE_PROVIDER_SITE_OTHER): Payer: Self-pay | Admitting: Cardiology

## 2018-04-29 VITALS — BP 118/68 | HR 101 | Ht 71.0 in | Wt 149.0 lb

## 2018-04-29 DIAGNOSIS — I5022 Chronic systolic (congestive) heart failure: Secondary | ICD-10-CM

## 2018-04-29 DIAGNOSIS — I2510 Atherosclerotic heart disease of native coronary artery without angina pectoris: Secondary | ICD-10-CM

## 2018-04-29 NOTE — Progress Notes (Signed)
Clinical Summary [name] is a 58 y.o.male seen today for follow up of the following medical problems.    1. CAD -  prior inferior STEMI s/p DES to RCA with residual LAD disease 05/2017 nuclear stress: large inreior infarct, no ischemia. High risk due to LVEF 30%  - isolated episode of pain before Christmas. None since  2. Chronic systolic HF - Jan 2018 echo LVEF 25-30%, diffuse hypokinesis, grade I diastolic dysfunction  - chronic SOB somewhat better with decreased smoking - no recent edema.  - mixed compliance with meds, difficulty affording. - has not been on ACEI/ARB/ARNI/aldactone due to soft bp's   3. EtOH abuse - last drink Dec 26  4. Medication noncompliance prevoiusly he stopped his brillinta after his previous stent after 6 months on his own. Past Medical History:  Diagnosis Date  . Alcohol abuse   . CAD (coronary artery disease)    a. 2014 cath with non obst CAD, EF 35%.    b. 04/2016: inferior STEMI s/p DES to RCA. 70% LAD with no intervention.  . Cocaine abuse (HCC)   . History of noncompliance with medical treatment, presenting hazards to health   . Hypertension   . Myocardial infarction (HCC)   . Pneumothorax   . Stroke [facility](HCC)      No Known Allergies   Current Outpatient Medications  Medication Sig Dispense Refill  . aspirin EC 81 MG EC tablet Take 1 tablet (81 mg total) by mouth daily.    Marland Kitchen. atorvastatin (LIPITOR) 40 MG tablet Take 1 tablet (40 mg total) by mouth daily at 6 PM. 30 tablet 2  . carvedilol (COREG) 6.25 MG tablet Take 1 tablet (6.25 mg total) by mouth 2 (two) times daily. 180 tablet 3  . folic acid (FOLVITE) 1 MG tablet Take 1 tablet (1 mg total) by mouth daily.    . Multiple Vitamin (MULTIVITAMIN WITH MINERALS) TABS tablet Take 1 tablet by mouth daily.     No current facility-administered medications for this visit.      Past Surgical History:  Procedure Laterality Date  . ABDOMINAL SURGERY    . CARDIAC CATHETERIZATION N/A  04/20/2016   Procedure: Left Heart Cath and Coronary Angiography;  Surgeon: Marykay Lexavid W Harding, MD;  [facility];  Service: Cardiovascular;  Laterality: N/A;  . CARDIAC CATHETERIZATION N/A 04/20/2016   Procedure: Coronary Stent Intervention;  Surgeon: Marykay Lexavid W Harding, MD;  [facility];  Service: Cardiovascular;  Laterality: N/A;  . COLON SURGERY    . LEFT HEART CATHETERIZATION WITH CORONARY ANGIOGRAM N/A 03/08/2013   Procedure: LEFT HEART CATHETERIZATION WITH CORONARY ANGIOGRAM;  Surgeon: Micheline ChapmanMichael D Cooper, MD;  [facility];  Service: Cardiovascular;  Laterality: N/A;  . LIVER SURGERY    . SPLENECTOMY       No Known Allergies    Family History  Problem Relation Age of Onset  . Heart attack Mother   . Diabetes Mother   . Stroke Mother   . Heart attack Father   . Diabetes Father      Social History [name] reports that he has been smoking cigarettes. He has a 90.00 pack-year smoking history. He has never used smokeless tobacco. [name] reports current alcohol use.   Review of Systems CONSTITUTIONAL: No weight loss, fever, chills, weakness or fatigue.  HEENT: Eyes: No visual loss, blurred vision, double vision or yellow sclerae.No hearing loss, sneezing, congestion, runny nose or sore throat.  SKIN:  No rash or itching.  CARDIOVASCULAR: per hpi RESPIRATORY: per hpi GASTROINTESTINAL: No anorexia, nausea, vomiting or diarrhea. No abdominal pain or blood.  GENITOURINARY: No burning on urination, no polyuria NEUROLOGICAL: No headache, dizziness, syncope, paralysis, ataxia, numbness or tingling in the extremities. No change in bowel or bladder control.  MUSCULOSKELETAL: No muscle, back pain, joint pain or stiffness.  LYMPHATICS: No enlarged nodes. No history of splenectomy.  PSYCHIATRIC: No history of depression or anxiety.  ENDOCRINOLOGIC: No reports of sweating, cold or heat intolerance. No polyuria or polydipsia.  Marland Kitchen   Physical  Examination Vitals:   04/29/18 0909  BP: 118/68  Pulse: (!) 101  SpO2: 94%   Vitals:   04/29/18 0909  Weight: 149 lb (67.6 kg)  Height: 5\' 11"  (1.803 m)    Gen: resting comfortably, no acute distress HEENT: no scleral icterus, pupils equal round and reactive, no palptable cervical adenopathy,  CV: RRR, no m/r,g no jvd Resp: Clear to auscultation bilaterally GI: abdomen is soft, non-tender, non-distended, normal bowel sounds, no hepatosplenomegaly MSK: extremities are warm, no edema.  Skin: warm, no rash Neuro:  no focal deficits Psych: appropriate affect   Diagnostic Studies  10/2017 echo Study Conclusions  - Left ventricle: The cavity size was normal. Wall thickness was   increased in a pattern of mild LVH. Systolic function was   moderately to severely reduced. The estimated ejection fraction   was = 35%. The study is not technically sufficient to allow   evaluation of LV diastolic function. - Aortic valve: Mildly calcified annulus. Normal thickness   leaflets. Valve area (VTI): 2.17 cm^2. Valve area (Vmax): 2.8   cm^2. - Atrial septum: No defect or patent foramen ovale was identified. - Technically difficult study.      Assessment and Plan  1. CAD - no significant ongoing symptoms, continue current meds - poor compliance with DAPT after previous stent, higher threshold in the future for cath.   2. Chronic systolic HF - management has been complicated by poor compliance. He has been off his meds x 1 week, going to pick up from pharmacy on Friday - has not been on ACE/ARB/ARNI/aldactone due to intermittent soft bps - not ICD candidate due to poor compliance - continue current meds      Antoine Poche, M.D.

## 2018-04-29 NOTE — Patient Instructions (Signed)
Medication Instructions:   Your physician recommends that you continue on your current medications as directed. Please refer to the Current Medication list given to you today.  Labwork:  none  Testing/Procedures:  none  Follow-Up:  Your physician recommends that you schedule a follow-up appointment in: 4 months.   Any Other Special Instructions Will Be Listed Below (If Applicable).  If you need a refill on your cardiac medications before your next appointment, please call your pharmacy. 

## 2019-02-23 ENCOUNTER — Other Ambulatory Visit: Payer: Self-pay

## 2019-02-23 DIAGNOSIS — Z20822 Contact with and (suspected) exposure to covid-19: Secondary | ICD-10-CM

## 2019-02-24 LAB — NOVEL CORONAVIRUS, NAA: SARS-CoV-2, NAA: NOT DETECTED

## 2019-02-26 ENCOUNTER — Telehealth: Payer: Self-pay | Admitting: General Practice

## 2019-02-26 NOTE — Telephone Encounter (Signed)
Patient is calling with Negative COVID test results. Patient expressed Understanding.

## 2019-10-24 ENCOUNTER — Emergency Department (HOSPITAL_COMMUNITY): Payer: Self-pay

## 2019-10-24 ENCOUNTER — Other Ambulatory Visit: Payer: Self-pay

## 2019-10-24 ENCOUNTER — Emergency Department (HOSPITAL_COMMUNITY)
Admission: EM | Admit: 2019-10-24 | Discharge: 2019-10-24 | Disposition: A | Discharge status: Home / Self Care | Payer: Self-pay | Attending: Emergency Medicine | Admitting: Emergency Medicine

## 2019-10-24 ENCOUNTER — Encounter (HOSPITAL_COMMUNITY): Payer: Self-pay

## 2019-10-24 DIAGNOSIS — Z20822 Contact with and (suspected) exposure to covid-19: Secondary | ICD-10-CM | POA: Insufficient documentation

## 2019-10-24 DIAGNOSIS — Z955 Presence of coronary angioplasty implant and graft: Secondary | ICD-10-CM | POA: Insufficient documentation

## 2019-10-24 DIAGNOSIS — Z7982 Long term (current) use of aspirin: Secondary | ICD-10-CM | POA: Insufficient documentation

## 2019-10-24 DIAGNOSIS — I2510 Atherosclerotic heart disease of native coronary artery without angina pectoris: Secondary | ICD-10-CM | POA: Insufficient documentation

## 2019-10-24 DIAGNOSIS — Z79899 Other long term (current) drug therapy: Secondary | ICD-10-CM | POA: Insufficient documentation

## 2019-10-24 DIAGNOSIS — I110 Hypertensive heart disease with heart failure: Secondary | ICD-10-CM | POA: Insufficient documentation

## 2019-10-24 DIAGNOSIS — R079 Chest pain, unspecified: Secondary | ICD-10-CM | POA: Insufficient documentation

## 2019-10-24 DIAGNOSIS — I5043 Acute on chronic combined systolic (congestive) and diastolic (congestive) heart failure: Secondary | ICD-10-CM | POA: Insufficient documentation

## 2019-10-24 DIAGNOSIS — F17210 Nicotine dependence, cigarettes, uncomplicated: Secondary | ICD-10-CM | POA: Insufficient documentation

## 2019-10-24 LAB — CBC
HCT: 46 % (ref 39.0–52.0)
Hemoglobin: 15.3 g/dL (ref 13.0–17.0)
MCH: 30.8 pg (ref 26.0–34.0)
MCHC: 33.3 g/dL (ref 30.0–36.0)
MCV: 92.6 fL (ref 80.0–100.0)
Platelets: 171 10*3/uL (ref 150–400)
RBC: 4.97 MIL/uL (ref 4.22–5.81)
RDW: 12.8 % (ref 11.5–15.5)
WBC: 5.9 10*3/uL (ref 4.0–10.5)
nRBC: 0 % (ref 0.0–0.2)

## 2019-10-24 LAB — BASIC METABOLIC PANEL
Anion gap: 10 (ref 5–15)
BUN: 10 mg/dL (ref 6–20)
CO2: 21 mmol/L — ABNORMAL LOW (ref 22–32)
Calcium: 8.3 mg/dL — ABNORMAL LOW (ref 8.9–10.3)
Chloride: 99 mmol/L (ref 98–111)
Creatinine, Ser: 0.78 mg/dL (ref 0.61–1.24)
GFR calc Af Amer: >60 mL/min (ref >60)
GFR calc non Af Amer: >60 mL/min (ref >60)
Glucose, Bld: 92 mg/dL (ref 70–99)
Potassium: 3.7 mmol/L (ref 3.5–5.1)
Sodium: 130 mmol/L — ABNORMAL LOW (ref 135–145)

## 2019-10-24 LAB — TROPONIN I (HIGH SENSITIVITY)
Troponin I (High Sensitivity): 10 ng/L (ref <18)
Troponin I (High Sensitivity): 11 ng/L (ref <18)

## 2019-10-24 LAB — HEPATIC FUNCTION PANEL
ALT: 11 U/L (ref 0–44)
AST: 15 U/L (ref 15–41)
Albumin: 3.6 g/dL (ref 3.5–5.0)
Alkaline Phosphatase: 57 U/L (ref 38–126)
Bilirubin, Direct: <0.1 mg/dL (ref 0.0–0.2)
Total Bilirubin: 0.2 mg/dL — ABNORMAL LOW (ref 0.3–1.2)
Total Protein: 6.8 g/dL (ref 6.5–8.1)

## 2019-10-24 LAB — SARS CORONAVIRUS 2 BY RT PCR (HOSPITAL ORDER, PERFORMED IN ~~LOC~~ HOSPITAL LAB): SARS Coronavirus 2: NEGATIVE

## 2019-10-24 NOTE — Discharge Instructions (Addendum)
As discussed, your test today are reassuring, however with your past medical history is very important for you to contact your cardiologist tomorrow for a follow-up appointment this week.  I recommend taking a full adult aspirin 324 mg daily to protect your heart until you are seen by Dr. Wyline Mood.    You have been offered admission for overnight observation and follow-up with cardiology in the morning, however you have opted go home this evening. Return here immediately if you have any return of your symptoms.

## 2019-10-24 NOTE — ED Triage Notes (Signed)
Pt brought to ED via [facility] for chest pain started yesterday around 1500. Pt given 1 nitro and ASA 324 mg PTA.

## 2019-10-24 NOTE — ED Provider Notes (Signed)
[facility] EMERGENCY DEPARTMENT Provider Note   CSN: [id number] Arrival date & time: 10/24/19  1318     History Chief Complaint  Patient presents with  . Chest Pain    Craig Perry is a 59 y.o. male with a history including MI in 2018 with stenting, history of hypertension, pneumothorax, CVA, alcohol abuse stating he had 3 beers today, also history of cocaine abuse but denies in the last 30 days presenting with right sided chest pressure which started yesterday around 3 PM. He was resting at the time of onset.  His symptoms remind him of his last MI.  Initially he thought it may be muscle strain as he spent the day before using power tools and endorses some chest wall soreness, but when the pain persisted without movement he became more concerned for possible cardiac involvement.  He denies increased shortness of breath but endorses mild shortness of breath (which is his baseline), no nausea or vomiting, no palpitations, dizziness or diaphoresis.  He was given aspirin 324 and also 1 nitroglycerin tablet during transport and reports resolution of his pain at this time.  The history is provided by the patient and the spouse.    HPI: A 64 year old patient with a history of CVA presents for evaluation of chest pain. Initial onset of pain was more than 6 hours ago. The patient's chest pain is described as heaviness/pressure/tightness, is not worse with exertion and is relieved by nitroglycerin. The patient's chest pain is not middle- or left-sided, is not well-localized, is not sharp and does not radiate to the arms/jaw/neck. The patient does not complain of nausea and denies diaphoresis. The patient has smoked in the past 90 days. The patient has no history of peripheral artery disease, denies any history of treated diabetes, has no relevant family history of coronary artery disease (first degree relative at less than age 49), is not hypertensive, has no history of hypercholesterolemia and does  not have an elevated BMI (>=30).   Past Medical History:  Diagnosis Date  . Alcohol abuse   . CAD (coronary artery disease)    a. 2014 cath with non obst CAD, EF 35%.    b. 04/2016: inferior STEMI s/p DES to RCA. 70% LAD with no intervention.  . Cocaine abuse (HCC)   . History of noncompliance with medical treatment, presenting hazards to health   . Hypertension   . Myocardial infarction (HCC)   . Pneumothorax   . Stroke [facility])     Patient Active Problem List   Diagnosis Date Noted  . Angina pectoris (HCC) 11/18/2017  . Alcohol use disorder, moderate, dependence (HCC) 07/22/2017  . Major depressive disorder, recurrent severe without psychotic features (HCC) 07/21/2017  . CAD (coronary artery disease) 06/16/2017  . Acute on chronic combined systolic and diastolic CHF (congestive heart failure) (HCC)   . Hypertension   . ST elevation myocardial infarction (STEMI) of inferior wall, initial episode of care [facility]): Type I MI 04/20/2016  . Alcoholic cardiomyopathy (HCC) 76/19/5093  . Chest pain 03/07/2013  . Tobacco abuse 03/07/2013  . ETOH abuse 03/07/2013  . Unintentional weight loss 03/07/2013    Past Surgical History:  Procedure Laterality Date  . ABDOMINAL SURGERY    . CARDIAC CATHETERIZATION N/A 04/20/2016   Procedure: Left Heart Cath and Coronary Angiography;  Surgeon: Marykay Lex, MD;  Location: [facility] INVASIVE CV LAB;  Service: Cardiovascular;  Laterality: N/A;  . CARDIAC CATHETERIZATION N/A 04/20/2016   Procedure: Coronary Stent Intervention;  Surgeon: Onalee Hua  Starr Lake, MD;  Location: [facility] INVASIVE CV LAB;  Service: Cardiovascular;  Laterality: N/A;  . COLON SURGERY    . LEFT HEART CATHETERIZATION WITH CORONARY ANGIOGRAM N/A 03/08/2013   Procedure: LEFT HEART CATHETERIZATION WITH CORONARY ANGIOGRAM;  Surgeon: Micheline Chapman, MD;  Location: [facility] CATH LAB;  Service: Cardiovascular;  Laterality: N/A;  . LIVER SURGERY    . SPLENECTOMY         Family History  Problem Relation  Age of Onset  . Heart attack Mother   . Diabetes Mother   . Stroke Mother   . Heart attack Father   . Diabetes Father     Social History   Tobacco Use  . Smoking status: Current Every Day Smoker    Packs/day: 0.75    Years: 45.00    Pack years: 33.75    Types: Cigarettes  . Smokeless tobacco: Never Used  Vaping Use  . Vaping Use: Former  Substance Use Topics  . Alcohol use: Yes    Comment: 6 beers a day.  formerly 18/day  . Drug use: Not Currently    Types: Cocaine    Home Medications Prior to Admission medications   Medication Sig Start Date End Date Taking? Authorizing Provider  aspirin EC 81 MG EC tablet Take 1 tablet (81 mg total) by mouth daily. Patient not taking: Reported on 10/24/2019 11/20/17   Philip Aspen, Limmie Patricia, MD  atorvastatin (LIPITOR) 40 MG tablet Take 1 tablet (40 mg total) by mouth daily at 6 PM. Patient not taking: Reported on 10/24/2019 11/19/17   Philip Aspen, Limmie Patricia, MD  carvedilol (COREG) 6.25 MG tablet Take 1 tablet (6.25 mg total) by mouth 2 (two) times daily. Patient not taking: Reported on 10/24/2019 12/22/17   Dyann Kief, PA-C  folic acid (FOLVITE) 1 MG tablet Take 1 tablet (1 mg total) by mouth daily. Patient not taking: Reported on 10/24/2019 11/20/17   Philip Aspen, Limmie Patricia, MD  Multiple Vitamin (MULTIVITAMIN WITH MINERALS) TABS tablet Take 1 tablet by mouth daily. Patient not taking: Reported on 10/24/2019 11/20/17   Philip Aspen, Limmie Patricia, MD    Allergies    Patient has no known allergies.  Review of Systems   Review of Systems  Constitutional: Negative for chills, diaphoresis and fever.  HENT: Negative for congestion and sore throat.   Eyes: Negative.   Respiratory: Positive for shortness of breath. Negative for chest tightness.   Cardiovascular: Positive for chest pain.  Gastrointestinal: Negative for abdominal pain, nausea and vomiting.  Genitourinary: Negative.   Musculoskeletal: Negative for arthralgias,  joint swelling and neck pain.  Skin: Negative.  Negative for rash and wound.  Neurological: Negative for dizziness, weakness, light-headedness, numbness and headaches.  Psychiatric/Behavioral: Negative.     Physical Exam Updated Vital Signs BP (!) 142/93   Pulse 94   Temp 98.7 F (37.1 C) (Oral)   Resp 19   Ht 5' 11.5" (1.816 m)   Wt 72.6 kg   SpO2 96%   BMI 22.00 kg/m   Physical Exam Vitals and nursing note reviewed.  Constitutional:      Appearance: He is well-developed.  HENT:     Head: Normocephalic and atraumatic.  Eyes:     Conjunctiva/sclera: Conjunctivae normal.  Cardiovascular:     Rate and Rhythm: Normal rate and regular rhythm.     Heart sounds: Normal heart sounds. No murmur heard.   Pulmonary:     Effort: Pulmonary effort is normal.  Breath sounds: Normal breath sounds. No wheezing, rhonchi or rales.  Abdominal:     General: Bowel sounds are normal.     Palpations: Abdomen is soft.     Tenderness: There is no abdominal tenderness.  Musculoskeletal:        General: Normal range of motion.     Cervical back: Normal range of motion.     Right lower leg: No tenderness. No edema.     Left lower leg: No tenderness. No edema.  Skin:    General: Skin is warm and dry.  Neurological:     Mental Status: He is alert.     ED Results / Procedures / Treatments   Labs (all labs ordered are listed, but only abnormal results are displayed) Labs Reviewed  BASIC METABOLIC PANEL - Abnormal; Notable for the following components:      Result Value   Sodium 130 (*)    CO2 21 (*)    Calcium 8.3 (*)    All other components within normal limits  HEPATIC FUNCTION PANEL - Abnormal; Notable for the following components:   Total Bilirubin 0.2 (*)    All other components within normal limits  SARS CORONAVIRUS 2 BY RT PCR (HOSPITAL ORDER, PERFORMED IN [facility] LAB)  CBC  TROPONIN I (HIGH SENSITIVITY)  TROPONIN I (HIGH SENSITIVITY)    EKG EKG  Interpretation  Date/Time:  Sunday October 24 2019 13:28:03 EDT Ventricular Rate:  96 PR Interval:    QRS Duration: 96 QT Interval:  340 QTC Calculation: 430 R Axis:   78 Text Interpretation: Sinus rhythm Borderline repolarization abnormality Confirmed by Bethann Berkshire [phone]) on 10/24/2019 3:07:46 PM   Radiology DG Chest 2 View  Result Date: 10/24/2019 CLINICAL DATA:  Chest pain, history coronary artery disease post MI and coronary stenting, stroke, hypertension, smoker EXAM: CHEST - 2 VIEW COMPARISON:  11/18/2017 FINDINGS: Normal heart size, mediastinal contours, and pulmonary vascularity. RIGHT apical scarring. Lungs otherwise clear. No pulmonary infiltrate, pleural effusion or pneumothorax. Scattered endplate spur formation thoracic spine with osseous demineralization. IMPRESSION: No acute abnormalities. Electronically Signed   By: Ulyses Southward M.D.   On: 10/24/2019 14:11    Procedures Procedures (including critical care time)  Medications Ordered in ED Medications - No data to display  ED Course  I have reviewed the triage vital signs and the nursing notes.  Pertinent labs & imaging results that were available during my care of the patient were reviewed by me and considered in my medical decision making (see chart for details).    MDM Rules/Calculators/A&P HEAR Score: 4                        Patient with persistent right-sided chest pain since yesterday which resolved upon arrival after receiving nitroglycerin in route.  His symptoms are suggestive of his prior MI.  Labs, imaging and EKG reviewed and discussed with patient.  His delta troponin is negative which is reassuring, especially given the fact that his symptoms started yesterday.  We discussed admission given the description of his symptoms and his prior history, however has been pain-free since arrival and he is very not willing to be admitted for overnight observation.  He has a heart score 4.  With delta troponin  negative and a unremarkable EKG I think it is reasonable for him to be disposed home but with very close cardiac follow-up.  He was strongly encouraged to call Dr. Verna Czech office in the morning  for a follow-up appointment this week.  He was also given strict return precautions for any return of his symptoms.  Patient understands and agrees with the plan. Final Clinical Impression(s) / ED Diagnoses Final diagnoses:  Chest pain, unspecified type    Rx / DC Orders ED Discharge Orders    None       Victoriano Laindol, Kennetha Pearman, PA-C 10/24/19 1804    Bethann BerkshireZammit, Joseph, MD 10/25/19 [phone]

## 2019-10-26 ENCOUNTER — Other Ambulatory Visit: Payer: Self-pay

## 2019-10-26 ENCOUNTER — Encounter: Payer: Self-pay | Admitting: Student

## 2019-10-26 ENCOUNTER — Ambulatory Visit (INDEPENDENT_AMBULATORY_CARE_PROVIDER_SITE_OTHER): Payer: Self-pay | Admitting: Student

## 2019-10-26 VITALS — BP 134/78 | HR 88 | Ht 71.5 in | Wt 145.0 lb

## 2019-10-26 DIAGNOSIS — F1910 Other psychoactive substance abuse, uncomplicated: Secondary | ICD-10-CM

## 2019-10-26 DIAGNOSIS — I5022 Chronic systolic (congestive) heart failure: Secondary | ICD-10-CM

## 2019-10-26 DIAGNOSIS — E785 Hyperlipidemia, unspecified: Secondary | ICD-10-CM

## 2019-10-26 DIAGNOSIS — I2510 Atherosclerotic heart disease of native coronary artery without angina pectoris: Secondary | ICD-10-CM

## 2019-10-26 DIAGNOSIS — I10 Essential (primary) hypertension: Secondary | ICD-10-CM

## 2019-10-26 MED ORDER — ATORVASTATIN CALCIUM 40 MG PO TABS
40.0000 mg | ORAL_TABLET | Freq: Every day | ORAL | 3 refills | Status: DC
Start: 2019-10-26 — End: 2019-12-30

## 2019-10-26 MED ORDER — ASPIRIN EC 81 MG PO TBEC
81.0000 mg | DELAYED_RELEASE_TABLET | Freq: Every day | ORAL | 3 refills | Status: DC
Start: 2019-10-26 — End: 2020-03-11

## 2019-10-26 NOTE — Patient Instructions (Addendum)
Medication Instructions:  Your physician has recommended you make the following change in your medication:   Restart Aspirin 81 mg Daily  Restart Atorvastatin 40 mg Daily   *If you need a refill on your cardiac medications before your next appointment, please call your pharmacy*   Lab Work: NONE   If you have labs (blood work) drawn today and your tests are completely normal, you will receive your results only by: Marland Kitchen MyChart Message (if you have MyChart) OR . A paper copy in the mail If you have any lab test that is abnormal or we need to change your treatment, we will call you to review the results.   Testing/Procedures: NONE   Follow-Up: At [facility] you and your health needs are our priority.  As part of our continuing mission to provide you with exceptional heart care, we have created designated Provider Care Teams.  These Care Teams include your primary Cardiologist (physician) and Advanced Practice Providers (APPs -  Physician Assistants and Nurse Practitioners) who all work together to provide you with the care you need, when you need it.  We recommend signing up for the patient portal called "MyChart".  Sign up information is provided on this After Visit Summary.  MyChart is used to connect with patients for Virtual Visits (Telemedicine).  Patients are able to view lab/test results, encounter notes, upcoming appointments, etc.  Non-urgent messages can be sent to your provider as well.   To learn more about what you can do with MyChart, go to [email or web address].    Your next appointment:   2 month(s)  The format for your next appointment:   In Person  Provider:   Dina Rich, MD or Randall An, PA-C   Other Instructions Thank you for choosing [facility]!

## 2019-10-26 NOTE — Progress Notes (Signed)
Cardiology Office Note    Date:  10/26/2019   ID:  Craig Perry 19-Sep-1960, MRN [id number]  PCP:  Patient, No Pcp Per  Cardiologist: Craig Rich, MD    Chief Complaint  Patient presents with  . Follow-up    Emergency Dept visit    History of Present Illness:    Craig Perry is a 59 y.o. male with past medical history of CAD (s/p STEMI in 04/2016 with DES to RCA and residual disease along LAD), chronic combined systolic and diastolic CHF (EF previously 35% in 2014, 25-30% by echo in 04/2016, at 35% in 10/2017), alcohol abuse, cocaine use, HTN, HLD, prior CVA and medication noncompliance who presents to the office today for Emergency Department follow-up.  He was last examined by Dr. Wyline Perry in 04/2018 and denied any recurrent episodes of chest pain but reported having chronic dyspnea on exertion. He had previously been noncompliant with DAPT following his cath in 04/2016 and Dr. Verna Perry note mentioned there would be a higher threshold in the future for a cardiac catheterization given his medication noncompliance. He had not been on an ACE-I/ARB/ARNI due to soft BP and was continued on ASA 81 mg daily, Atorvastatin 40 mg daily and Coreg 6.25 mg twice daily. He was not felt to be a candidate for an ICD due to poor compliance as well.   In the interim, he presented to [facility] ED on 10/24/2019 for evaluation of chest pain which started the day prior to arrival. Initial and delta high-sensitivity troponin values were negative and his EKG showed no acute ischemic changes. He was therefore discharged home and informed to follow-up with Cardiology as an outpatient.  In talking with the patient today, he reports his chest pain was mostly right-sided at the time of ED evaluation and he feels like he might have pulled a muscle as he works as a Curator at a Development worker, international aid. He denies any recurrent pain since then. Reports his breathing has been at baseline and he denies any  specific orthopnea, PND or lower extremity edema. No recent exertional chest pain or palpitations.  He does continue to smoke approximately 1.0 - 1.5 ppd (previously smoking 2.5 ppd). He does report using cocaine approximately 1 month ago but denies any recurrent use. He reports prior to his heart attack he was consuming over 48 beers per day but has reduced his use to less than 6 beers per day currently. He has self-discontinued all of his medications.   Past Medical History:  Diagnosis Date  . Alcohol abuse   . CAD (coronary artery disease)    a. 2014 cath with non obst CAD, EF 35%.    b. 04/2016: inferior STEMI s/p DES to RCA. 70% LAD with no intervention.  . Cocaine abuse (HCC)   . History of noncompliance with medical treatment, presenting hazards to health   . Hypertension   . Myocardial infarction (HCC)   . Pneumothorax   . Stroke [facility])     Past Surgical History:  Procedure Laterality Date  . ABDOMINAL SURGERY    . CARDIAC CATHETERIZATION N/A 04/20/2016   Procedure: Left Heart Cath and Coronary Angiography;  Surgeon: Marykay Lex, MD;  Location: [facility] INVASIVE CV LAB;  Service: Cardiovascular;  Laterality: N/A;  . CARDIAC CATHETERIZATION N/A 04/20/2016   Procedure: Coronary Stent Intervention;  Surgeon: Marykay Lex, MD;  Location: [facility] INVASIVE CV LAB;  Service: Cardiovascular;  Laterality: N/A;  . COLON SURGERY    .  LEFT HEART CATHETERIZATION WITH CORONARY ANGIOGRAM N/A 03/08/2013   Procedure: LEFT HEART CATHETERIZATION WITH CORONARY ANGIOGRAM;  Surgeon: Micheline Chapman, MD;  Location: [facility] CATH LAB;  Service: Cardiovascular;  Laterality: N/A;  . LIVER SURGERY    . SPLENECTOMY      Current Medications: Outpatient Medications Prior to Visit  Medication Sig Dispense Refill  . aspirin EC 81 MG EC tablet Take 1 tablet (81 mg total) by mouth daily. (Patient not taking: Reported on 10/24/2019)    . atorvastatin (LIPITOR) 40 MG tablet Take 1 tablet (40 mg total) by mouth daily at 6  PM. (Patient not taking: Reported on 10/24/2019) 30 tablet 2  . carvedilol (COREG) 6.25 MG tablet Take 1 tablet (6.25 mg total) by mouth 2 (two) times daily. (Patient not taking: Reported on 10/24/2019) 180 tablet 3  . folic acid (FOLVITE) 1 MG tablet Take 1 tablet (1 mg total) by mouth daily. (Patient not taking: Reported on 10/24/2019)    . Multiple Vitamin (MULTIVITAMIN WITH MINERALS) TABS tablet Take 1 tablet by mouth daily. (Patient not taking: Reported on 10/24/2019)     No facility-administered medications prior to visit.     Allergies:   Patient has no known allergies.   Social History   Socioeconomic History  . Marital status: Single    Spouse name: Not on file  . Number of children: Not on file  . Years of education: Not on file  . Highest education level: Not on file  Occupational History  . Not on file  Tobacco Use  . Smoking status: Current Every Day Smoker    Packs/day: 1.50    Years: 45.00    Pack years: 67.50    Types: Cigarettes  . Smokeless tobacco: Never Used  Vaping Use  . Vaping Use: Former  Substance and Sexual Activity  . Alcohol use: Yes    Comment: 6 beers a day.  formerly 18/day  . Drug use: Yes    Types: Cocaine  . Sexual activity: Not Currently  Other Topics Concern  . Not on file  Social History Narrative  . Not on file   Social Determinants of Health   Financial Resource Strain:   . Difficulty of Paying Living Expenses:   Food Insecurity:   . Worried About Programme researcher, broadcasting/film/video in the Last Year:   . Barista in the Last Year:   Transportation Needs:   . Freight forwarder (Medical):   Marland Kitchen Lack of Transportation (Non-Medical):   Physical Activity:   . Days of Exercise per Week:   . Minutes of Exercise per Session:   Stress:   . Feeling of Stress :   Social Connections:   . Frequency of Communication with Friends and Family:   . Frequency of Social Gatherings with Friends and Family:   . Attends Religious Services:   . Active  Member of Clubs or Organizations:   . Attends Banker Meetings:   Marland Kitchen Marital Status:      Family History:  The patient's family history includes Diabetes in his father and mother; Heart attack in his father and mother; Stroke in his mother.   Review of Systems:   Please see the history of present illness.     General:  No chills, fever, night sweats or weight changes.  Cardiovascular:  No edema, orthopnea, palpitations, paroxysmal nocturnal dyspnea. Positive for chest pain and dyspnea on exertion.  Dermatological: No rash, lesions/masses Respiratory: No cough, dyspnea Urologic: No  hematuria, dysuria Abdominal:   No nausea, vomiting, diarrhea, bright red blood per rectum, melena, or hematemesis Neurologic:  No visual changes, wkns, changes in mental status. All other systems reviewed and are otherwise negative except as noted above.   Physical Exam:    VS:  BP (!) 134/78   Pulse 88   Ht 5' 11.5" (1.816 m)   Wt 145 lb (65.8 kg)   SpO2 95%   BMI 19.94 kg/m    General: Well developed, well nourished,male appearing in no acute distress. Head: Normocephalic, atraumatic, sclera non-icteric. Wearing a mask.  Neck: No carotid bruits. JVD not elevated.  Lungs: Respirations regular and unlabored, without wheezes or rales.  Heart: Regular rate and rhythm. No S3 or S4.  No murmur, no rubs, or gallops appreciated. Abdomen: Soft, non-tender, non-distended. No obvious abdominal masses. Msk:  Strength and tone appear normal for age. No obvious joint deformities or effusions. Extremities: No clubbing or cyanosis. No lower extremity edema.  Distal pedal pulses are 2+ bilaterally. Neuro: Alert and oriented X 3. Moves all extremities spontaneously. No focal deficits noted. Psych:  Responds to questions appropriately with a normal affect. Skin: No rashes or lesions noted  Wt Readings from Last 3 Encounters:  10/26/19 145 lb (65.8 kg)  10/24/19 160 lb (72.6 kg)  04/29/18 149 lb (67.6  kg)     Studies/Labs Reviewed:   EKG:  EKG is not ordered today. EKG from 10/24/2019 is reviewed which shows NSR, HR 96 with LVH and associated repol abnormality with ST depression along lateral leads. No acute changes when compared to prior tracings.   Recent Labs: 10/24/2019: ALT 11; BUN 10; Creatinine, Ser 0.78; Hemoglobin 15.3; Platelets 171; Potassium 3.7; Sodium 130   Lipid Panel    Component Value Date/Time   CHOL 183 11/19/2017 0515   TRIG 303 (H) 11/19/2017 0515   HDL 36 (L) 11/19/2017 0515   CHOLHDL 5.1 11/19/2017 0515   VLDL 61 (H) 11/19/2017 0515   LDLCALC 86 11/19/2017 0515    Additional studies/ records that were reviewed today include:   Cardiac Catheterization: 04/2016  INFERIOR STEMI WITH Mid RCA lesion, 100 %stenosed.  A STENT SYNERGY DES 2.5X28 (2.8 mm) drug eluting stent was successfully placed.  Post intervention, there is a 0% residual stenosis.  3rd RPLB lesion, 100 %stenosed - distal embolization  ____________________________________  Mid LAD-2 lesion, 70 %stenosed at major Diag 2. Ost 2nd Diag to 2nd Diag lesion, 40 %stenosed.  Lat 1st Mrg lesion, 100 %stenosed.  LV FUNCTION & HEMODYNAMICS  There is mild left ventricular systolic dysfunction. The left ventricular ejection fraction is 45-50% by visual estimate.  LV end diastolic pressure is moderately elevated.   Severe 2 vessel disease with 100% occlusion of the RCA treated with a DES stent. There is also distal embolization. The distal tip of the posterolateral branch. This will be treated with IV Aggrastat for 6 hours. The other diseased vessel is the mid LAD with up to 70% stenosis at D2.    Plan:  Admit to CCU for TR band removal and post STEMI PCI care   Aggrastat for 6 hours   Dual antiplatelet therapy for minimum of 3 months at which time aspirin could be stopped. - Loaded with Brilinta   Continue carvedilol, add statin   Smoking cessation counseling will be necessary.  Cardiac  rehabilitation consult - [facility] for Phase II  Based on his history of extreme alcohol use, would likely be best to minimize his hospital  stay in order to avoid potential withdrawal. The LAD lesion does not look acute.   At this point, my recommendation would be medical management of this lesion to allow for recovery from the current MI. Would then potentially reassess based on symptoms or stress test in a month. This will also allow [place] to ensure that he remains compliant with medications.   Echocardiogram: 10/2017 Study Conclusions   - Left ventricle: The cavity size was normal. Wall thickness was  increased in a pattern of mild LVH. Systolic function was  moderately to severely reduced. The estimated ejection fraction  was = 35%. The study is not technically sufficient to allow  evaluation of LV diastolic function.  - Aortic valve: Mildly calcified annulus. Normal thickness  leaflets. Valve area (VTI): 2.17 cm^2. Valve area (Vmax): 2.8  cm^2.  - Atrial septum: No defect or patent foramen ovale was identified.  - Technically difficult study.   Assessment:    1. Coronary artery disease involving native coronary artery of native heart without angina pectoris   2. Chronic systolic heart failure (HCC)   3. Essential hypertension   4. Hyperlipidemia LDL goal <70   5. Substance abuse (HCC)      Plan:   In order of problems listed above:  1. CAD - He is s/p STEMI in 04/2016 with DES to RCA and residual disease along LAD. Recently evaluated in the ED for chest pain and by his description this seems most consistent with MSK pain as it was along his right pectoral region and worse with movement of his arms. Troponin values were negative and his EKG did not show any ischemic changes. He denies any recurrent symptoms since.  - He has self-discontinued all of his medications for unclear reasons. I recommended we restart his ASA 81mg  daily and Atorvastatin 40mg  daily. Will plan  for repeat FLP and LFT's at his next office visit in 2 months if he has been compliant with medications in the interim.  2. Chronic Systolic CHF - EF was previously 35% in 2014, 25-30% by echo in 04/2016, at 35% in 10/2017. Suspect a mixed etiology in the setting of ischemia but also in the setting of chronic alcohol use.  - He denies any recent orthopnea, PND or lower extremity edema. Appears euvolemic on examination. He does consume fast food 2-3 times per week and we reviewed the importance of limiting sodium intake.  - At the time of his next visit, would recommend resuming Coreg 3.125mg  BID as he was previously on this and self-discontinued. By review of notes he is felt to not be a candidate for an ICD due to poor compliance.   3. HTN - BP is well-controlled at 134/78 during today's visit. Slowly restarting his cardiac medications as outlined above. Would plan to restart Coreg at his next visit.   4. HLD - He has been off statin therapy for 6+ months. Will restart Atorvastatin 40mg  daily. Recheck FLP and LFT's at the time of his next visit.   5. Substance Use - He continues to smoke approximately 1.0 - 1.5 ppd, consumes 4-6 beers daily (previously consuming 48 beers daily) and used Cocaine last month. Cessation was strongly advised and the risk of this behavior was reviewed with the patient.    Medication Adjustments/Labs and Tests Ordered: Current medicines are reviewed at length with the patient today.  Concerns regarding medicines are outlined above.  Medication changes, Labs and Tests ordered today are listed in the Patient Instructions below. Patient  Instructions  Medication Instructions:  Your physician has recommended you make the following change in your medication:   Restart Aspirin 81 mg Daily  Restart Atorvastatin 40 mg Daily   *If you need a refill on your cardiac medications before your next appointment, please call your pharmacy*   Lab Work: NONE   If you have labs  (blood work) drawn today and your tests are completely normal, you will receive your results only by: Marland Kitchen MyChart Message (if you have MyChart) OR . A paper copy in the mail If you have any lab test that is abnormal or we need to change your treatment, we will call you to review the results.   Testing/Procedures: NONE   Follow-Up: At [facility], you and your health needs are our priority.  As part of our continuing mission to provide you with exceptional heart care, we have created designated Provider Care Teams.  These Care Teams include your primary Cardiologist (physician) and Advanced Practice Providers (APPs -  Physician Assistants and Nurse Practitioners) who all work together to provide you with the care you need, when you need it.  We recommend signing up for the patient portal called "MyChart".  Sign up information is provided on this After Visit Summary.  MyChart is used to connect with patients for Virtual Visits (Telemedicine).  Patients are able to view lab/test results, encounter notes, upcoming appointments, etc.  Non-urgent messages can be sent to your provider as well.   To learn more about what you can do with MyChart, go to [email or web address].    Your next appointment:   2 month(s)  The format for your next appointment:   In Person  Provider:   Dina Rich, MD or Randall An, PA-C   Other Instructions Thank you for choosing [facility]!       Signed, Ellsworth Lennox, PA-C  10/26/2019 6:48 PM    [facility] [address] [place], [place] [address] Phone: [phone] Fax: [phone]

## 2019-11-01 ENCOUNTER — Ambulatory Visit: Payer: Self-pay | Admitting: Physician Assistant

## 2019-12-28 ENCOUNTER — Emergency Department (HOSPITAL_COMMUNITY)
Admission: EM | Admit: 2019-12-28 | Discharge: 2019-12-29 | Discharge status: Against Medical Advice | Payer: Self-pay | Attending: Emergency Medicine | Admitting: Emergency Medicine

## 2019-12-28 ENCOUNTER — Other Ambulatory Visit: Payer: Self-pay

## 2019-12-28 ENCOUNTER — Encounter (HOSPITAL_COMMUNITY): Payer: Self-pay | Admitting: Emergency Medicine

## 2019-12-28 DIAGNOSIS — R202 Paresthesia of skin: Secondary | ICD-10-CM | POA: Insufficient documentation

## 2019-12-28 DIAGNOSIS — G459 Transient cerebral ischemic attack, unspecified: Secondary | ICD-10-CM

## 2019-12-28 DIAGNOSIS — F10929 Alcohol use, unspecified with intoxication, unspecified: Secondary | ICD-10-CM | POA: Insufficient documentation

## 2019-12-28 DIAGNOSIS — Z5321 Procedure and treatment not carried out due to patient leaving prior to being seen by health care provider: Secondary | ICD-10-CM | POA: Insufficient documentation

## 2019-12-28 NOTE — ED Triage Notes (Signed)
[facility] - pt c/o tingling all over his body and thought he was having a stroke. Stroke screen negative for EMS. Pt heavily intoxicated.

## 2019-12-29 ENCOUNTER — Emergency Department (HOSPITAL_COMMUNITY): Payer: Self-pay

## 2019-12-29 NOTE — ED Notes (Signed)
Pt walked out without signing AMA or paperwork. Pt stating he is going to walk home.

## 2019-12-29 NOTE — Discharge Instructions (Signed)
Take your medicines as prescribed. Read the instruction provided   Please return to the ER if your symptoms return or you have new neurologic symptoms (new weakness, numbness, vision change, speech change etc.) or chest pain.

## 2019-12-30 ENCOUNTER — Other Ambulatory Visit: Payer: Self-pay

## 2019-12-30 ENCOUNTER — Ambulatory Visit (INDEPENDENT_AMBULATORY_CARE_PROVIDER_SITE_OTHER): Payer: Self-pay | Admitting: Student

## 2019-12-30 ENCOUNTER — Encounter: Payer: Self-pay | Admitting: Student

## 2019-12-30 VITALS — BP 150/92 | HR 100 | Ht 71.5 in | Wt 144.6 lb

## 2019-12-30 DIAGNOSIS — I2510 Atherosclerotic heart disease of native coronary artery without angina pectoris: Secondary | ICD-10-CM

## 2019-12-30 DIAGNOSIS — I10 Essential (primary) hypertension: Secondary | ICD-10-CM

## 2019-12-30 DIAGNOSIS — F1910 Other psychoactive substance abuse, uncomplicated: Secondary | ICD-10-CM

## 2019-12-30 DIAGNOSIS — I5022 Chronic systolic (congestive) heart failure: Secondary | ICD-10-CM

## 2019-12-30 MED ORDER — CARVEDILOL 3.125 MG PO TABS
3.1250 mg | ORAL_TABLET | Freq: Two times a day (BID) | ORAL | 3 refills | Status: DC
Start: 2019-12-30 — End: 2020-01-03

## 2019-12-30 MED ORDER — ATORVASTATIN CALCIUM 40 MG PO TABS
40.0000 mg | ORAL_TABLET | Freq: Every day | ORAL | 3 refills | Status: DC
Start: 2019-12-30 — End: 2020-01-03

## 2019-12-30 NOTE — Progress Notes (Signed)
Cardiology Office Note    Date:  12/30/2019   ID:  Craig Skeenhomas W Joswick, DOB 02-09-1961, MRN [id number]  PCP:  Patient, No Pcp Per  Cardiologist: Dina RichBranch, Jonathan, MD    Chief Complaint  Patient presents with   Follow-up    2 month visit    History of Present Illness:    Craig Perry is a 59 y.o. male with past medical history of CAD (s/p STEMI in 04/2016 with DES to RCA and residual disease along LAD), chronic combined systolic and diastolic CHF (EF previously 35% in 2014, 25-30% by echo in 04/2016, at 35% in 10/2017), alcohol abuse, cocaine use, HTN, HLD, prior CVA and medication noncompliance who presents to the office today for 3462-month follow-up.   He was last examined by myself in 09/2019 following a recent Emergency Department visit for chest pain during which troponin values were negative and his EKG was without acute ischemic changes. He did report episodes of right-sided chest pain which was worse with positional changes but without any exertional symptoms. He was still smoking 1.0 to 1.5 packs/day and consuming a reported 5-6 beers per day (previously drinking 48 beers per day prior to his MI in 2018). He had self discontinued all of his medications and it was recommended to restart ASA 81 mg daily along with Atorvastatin 40 mg daily with plans for repeat labs in 2 months. The plan was to gradually add back medications for his cardiomyopathy including resuming Coreg 3.25 mg twice daily at his next visit. He was previously not felt to be an ICD candidate due to poor compliance.  By review of notes, he presented back to the ED on 12/28/2019 for reports of tingling along his entire body and he was concerned about a CVA. Was "heavily intoxicated" by review of notes and left AMA prior to evaluation.   In talking with the patient today, he reports overall doing well since his last office visit. He says he had experienced dizziness a few days prior but symptoms resolved by the time he  got to the ED, therefore he left. He denies any recurrent symptoms since. 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 He does have intermittent dyspnea and continues to smoke approximately 1.0 ppd. He denies any recent orthopnea, PND or lower extremity edema. No recent cocaine use per his report. He is consuming approximately 4-6 beers on a daily basis.  Did not fill his previous Rx for Atorvastatin due to not having a ride to the pharmacy.  Past Medical History:  Diagnosis Date   Alcohol abuse    CAD (coronary artery disease)    a. 2014 cath with non obst CAD, EF 35%.    b. 04/2016: inferior STEMI s/p DES to RCA. 70% LAD with no intervention.   CHF (congestive heart failure) (HCC)    Cocaine abuse (HCC)    History of noncompliance with medical treatment, presenting hazards to health    Hypertension    Myocardial infarction [facility](HCC)    Pneumothorax    Stroke [facility](HCC)     Past Surgical History:  Procedure Laterality Date   ABDOMINAL SURGERY     CARDIAC CATHETERIZATION N/A 04/20/2016   Procedure: Left Heart Cath and Coronary Angiography;  Surgeon: Marykay Lexavid W Harding, MD;  [facility] CV LAB;  Service: Cardiovascular;  Laterality: N/A;   CARDIAC CATHETERIZATION N/A 04/20/2016   Procedure: Coronary Stent Intervention;  Surgeon:  Marykay Lex, MD;  Location: [facility] INVASIVE CV LAB;  Service: Cardiovascular;  Laterality: N/A;   COLON SURGERY     LEFT HEART CATHETERIZATION WITH CORONARY ANGIOGRAM N/A 03/08/2013   Procedure: LEFT HEART CATHETERIZATION WITH CORONARY ANGIOGRAM;  Surgeon: Micheline Chapman, MD;  Location: [facility] CATH LAB;  Service: Cardiovascular;  Laterality: N/A;   LIVER SURGERY     SPLENECTOMY      Current Medications: Outpatient Medications Prior to Visit  Medication Sig Dispense Refill   aspirin EC 81 MG tablet Take 1 tablet (81 mg total) by mouth daily. Swallow whole. 90 tablet  3   atorvastatin (LIPITOR) 40 MG tablet Take 1 tablet (40 mg total) by mouth daily. 90 tablet 3   No facility-administered medications prior to visit.     Allergies:   Patient has no known allergies.   Social History   Socioeconomic History   Marital status: Single    Spouse name: Not on file   Number of children: Not on file   Years of education: Not on file   Highest education level: Not on file  Occupational History   Not on file  Tobacco Use   Smoking status: Current Every Day Smoker    Packs/day: 1.50    Years: 45.00    Pack years: 67.50    Types: Cigarettes   Smokeless tobacco: Never Used  Vaping Use   Vaping Use: Former  Substance and Sexual Activity   Alcohol use: Yes    Comment: 6 beers a day.  formerly 18/day   Drug use: Yes    Types: Cocaine   Sexual activity: Not Currently  Other Topics Concern   Not on file  Social History Narrative   Not on file   Social Determinants of Health   Financial Resource Strain:    Difficulty of Paying Living Expenses: Not on file  Food Insecurity:    Worried About Running Out of Food in the Last Year: Not on file   Ran Out of Food in the Last Year: Not on file  Transportation Needs:    Lack of Transportation (Medical): Not on file   Lack of Transportation (Non-Medical): Not on file  Physical Activity:    Days of Exercise per Week: Not on file   Minutes of Exercise per Session: Not on file  Stress:    Feeling of Stress : Not on file  Social Connections:    Frequency of Communication with Friends and Family: Not on file   Frequency of Social Gatherings with Friends and Family: Not on file   Attends Religious Services: Not on file   Active Member of Clubs or Organizations: Not on file   Attends Banker Meetings: Not on file   Marital Status: Not on file     Family History:  The patient's family history includes Diabetes in his father and mother; Heart attack in his father and  mother; Stroke in his mother.   Review of Systems:   Please see the history of present illness.     General:  No chills, fever, night sweats or weight changes.  Cardiovascular:  No chest pain, edema, orthopnea, palpitations, paroxysmal nocturnal dyspnea. Positive for dyspnea on exertion (at baseline).  Dermatological: No rash, lesions/masses Respiratory: No cough, dyspnea Urologic: No hematuria, dysuria Abdominal:   No nausea, vomiting, diarrhea, bright red blood per rectum, melena, or hematemesis Neurologic:  No visual changes, wkns, changes in mental status. All other systems reviewed and are otherwise negative except  as noted above.   Physical Exam:    VS:  BP (!) 150/92    Pulse 100    Ht 5' 11.5" (1.816 m)    Wt 144 lb 9.6 oz (65.6 kg)    SpO2 98%    BMI 19.89 kg/m    General: Thin male appearing in no acute distress. Head: Normocephalic, atraumatic. Neck: No carotid bruits. JVD not elevated.  Lungs: Respirations regular and unlabored, without wheezes or rales.  Heart: Regular rate and rhythm. No S3 or S4.  No murmur, no rubs, or gallops appreciated. Abdomen: Appears non-distended. No obvious abdominal masses. Msk:  Strength and tone appear normal for age. No obvious joint deformities or effusions. Extremities: No clubbing or cyanosis. No lower extremity edema.  Distal pedal pulses are 2+ bilaterally. Neuro: Alert and oriented X 3. Moves all extremities spontaneously. No focal deficits noted. Psych:  Responds to questions appropriately with a normal affect. Skin: No rashes or lesions noted  Wt Readings from Last 3 Encounters:  12/30/19 144 lb 9.6 oz (65.6 kg)  12/28/19 146 lb (66.2 kg)  10/26/19 145 lb (65.8 kg)     Studies/Labs Reviewed:   EKG:  EKG is not ordered today.   Recent Labs: 10/24/2019: ALT 11; BUN 10; Creatinine, Ser 0.78; Hemoglobin 15.3; Platelets 171; Potassium 3.7; Sodium 130   Lipid Panel    Component Value Date/Time   CHOL 183 11/19/2017 0515    TRIG 303 (H) 11/19/2017 0515   HDL 36 (L) 11/19/2017 0515   CHOLHDL 5.1 11/19/2017 0515   VLDL 61 (H) 11/19/2017 0515   LDLCALC 86 11/19/2017 0515    Additional studies/ records that were reviewed today include:   Cardiac Catheterization: 04/2016  INFERIOR STEMI WITH Mid RCA lesion, 100 %stenosed.  A STENT SYNERGY DES 2.5X28 (2.8 mm) drug eluting stent was successfully placed.  Post intervention, there is a 0% residual stenosis.  3rd RPLB lesion, 100 %stenosed - distal embolization  ____________________________________  Mid LAD-2 lesion, 70 %stenosed at major Diag 2. Ost 2nd Diag to 2nd Diag lesion, 40 %stenosed.  Lat 1st Mrg lesion, 100 %stenosed.  LV FUNCTION & HEMODYNAMICS  There is mild left ventricular systolic dysfunction. The left ventricular ejection fraction is 45-50% by visual estimate.  LV end diastolic pressure is moderately elevated.   Severe 2 vessel disease with 100% occlusion of the RCA treated with a DES stent. There is also distal embolization. The distal tip of the posterolateral branch. This will be treated with IV Aggrastat for 6 hours. The other diseased vessel is the mid LAD with up to 70% stenosis at D2.    Plan:  Admit to CCU for TR band removal and post STEMI PCI care   Aggrastat for 6 hours   Dual antiplatelet therapy for minimum of 3 months at which time aspirin could be stopped. - Loaded with Brilinta   Continue carvedilol, add statin   Smoking cessation counseling will be necessary.  Cardiac rehabilitation consult - [facility] for Phase II  Based on his history of extreme alcohol use, would likely be best to minimize his hospital stay in order to avoid potential withdrawal. The LAD lesion does not look acute.   At this point, my recommendation would be medical management of this lesion to allow for recovery from the current MI. Would then potentially reassess based on symptoms or stress test in a month. This will also allow [place] to ensure  that he remains compliant with medications.   Echocardiogram: 10/2017  Study Conclusions   - Left ventricle: The cavity size was normal. Wall thickness was  increased in a pattern of mild LVH. Systolic function was  moderately to severely reduced. The estimated ejection fraction  was = 35%. The study is not technically sufficient to allow  evaluation of LV diastolic function.  - Aortic valve: Mildly calcified annulus. Normal thickness  leaflets. Valve area (VTI): 2.17 cm^2. Valve area (Vmax): 2.8  cm^2.  - Atrial septum: No defect or patent foramen ovale was identified.  - Technically difficult study.   Assessment:    1. Coronary artery disease involving native coronary artery of native heart without angina pectoris   2. Chronic systolic heart failure (HCC)   3. Essential hypertension   4. Substance abuse (HCC)      Plan:   In order of problems listed above:  1. CAD - He is s/p STEMI in 04/2016 with DES to RCA and residual disease along the LAD. He has baseline dyspnea on exertion in the setting of chronic tobacco use but denies any acute changes in this. No recent chest pain.  - Continue ASA 81mg  daily and will restart Atorvastatin 40mg  daily. He did not previously start this due to not having transportation to the pharmacy but his coworker has agreed to take him to pick up his prescriptions.   2. Chronic Combined Systolic and Diastolic CHF - EF was previously 35% in 2014 with repeat imaging in 10/2017 showing similar results. Medical therapy has been limited secondary to noncompliance with visits and his medications.  - He denies any recent orthopnea, PND or edema. Will plan to restart Coreg 3.125mg  BID. We reviewed the continued importance of not using Cocaine and he denies any recent use. Can hopefully restart an ARB at his next visit. He does not have insurance coverage so we have been using generic medications (previously self-discontinued all medications due to  cost). Would ultimately plan for a repeat echo once back on optimal medical therapy.   3. HTN - BP was initially elevated at 150/92, slightly improved to 142/84 on recheck. Will plan to restart Coreg as outlined above.   4. Substance Use - He continues to smoke 1.0 ppd and drinks 4-6 beers on a daily basis. Continued reduction advised.    Medication Adjustments/Labs and Tests Ordered: Current medicines are reviewed at length with the patient today.  Concerns regarding medicines are outlined above.  Medication changes, Labs and Tests ordered today are listed in the Patient Instructions below. Patient Instructions  Medication Instructions:  Your physician has recommended you make the following change in your medication:   Atorvastatin 40 mg Daily  Coreg 3.125 mg Two Times Daily   *If you need a refill on your cardiac medications before your next appointment, please call your pharmacy*   Lab Work: NONE   If you have labs (blood work) drawn today and your tests are completely normal, you will receive your results only by:  MyChart Message (if you have MyChart) OR  A paper copy in the mail If you have any lab test that is abnormal or we need to change your treatment, we will call you to review the results.   Testing/Procedures: NONE     Follow-Up: At [facility], you and your health needs are our priority.  As part of our continuing mission to provide you with exceptional heart care, we have created designated Provider Care Teams.  These Care Teams include your primary Cardiologist (physician) and Advanced Practice Providers (APPs -  Physician Assistants and Nurse Practitioners) who all work together to provide you with the care you need, when you need it.  We recommend signing up for the patient portal called "MyChart".  Sign up information is provided on this After Visit Summary.  MyChart is used to connect with patients for Virtual Visits (Telemedicine).  Patients are able to  view lab/test results, encounter notes, upcoming appointments, etc.  Non-urgent messages can be sent to your provider as well.   To learn more about what you can do with MyChart, go to [email or web address].    Your next appointment:   3 month(s)  The format for your next appointment:   In Person  Provider:   Dina Rich, MD or Randall An, PA-C   Other Instructions Thank you for choosing [facility]!       Signed, Ellsworth Lennox, PA-C  12/30/2019 7:00 PM    [facility] [address] [place], [place] [address] Phone: [phone] Fax: [phone]

## 2019-12-30 NOTE — Patient Instructions (Signed)
Medication Instructions:  Your physician has recommended you make the following change in your medication:   Atorvastatin 40 mg Daily  Coreg 3.125 mg Two Times Daily   *If you need a refill on your cardiac medications before your next appointment, please call your pharmacy*   Lab Work: NONE   If you have labs (blood work) drawn today and your tests are completely normal, you will receive your results only by: Marland Kitchen MyChart Message (if you have MyChart) OR . A paper copy in the mail If you have any lab test that is abnormal or we need to change your treatment, we will call you to review the results.   Testing/Procedures: NONE     Follow-Up: At [facility], you and your health needs are our priority.  As part of our continuing mission to provide you with exceptional heart care, we have created designated Provider Care Teams.  These Care Teams include your primary Cardiologist (physician) and Advanced Practice Providers (APPs -  Physician Assistants and Nurse Practitioners) who all work together to provide you with the care you need, when you need it.  We recommend signing up for the patient portal called "MyChart".  Sign up information is provided on this After Visit Summary.  MyChart is used to connect with patients for Virtual Visits (Telemedicine).  Patients are able to view lab/test results, encounter notes, upcoming appointments, etc.  Non-urgent messages can be sent to your provider as well.   To learn more about what you can do with MyChart, go to [email or web address].    Your next appointment:   3 month(s)  The format for your next appointment:   In Person  Provider:   Dina Rich, MD or Randall An, PA-C   Other Instructions Thank you for choosing [facility]!

## 2020-01-03 ENCOUNTER — Telehealth: Payer: Self-pay | Admitting: Student

## 2020-01-03 MED ORDER — CARVEDILOL 3.125 MG PO TABS: 3.1250 mg | ORAL_TABLET | Freq: Two times a day (BID) | ORAL | 3 refills | Status: DC

## 2020-01-03 MED ORDER — ATORVASTATIN CALCIUM 40 MG PO TABS: 40.0000 mg | ORAL_TABLET | Freq: Every day | ORAL | 3 refills | Status: DC

## 2020-01-03 NOTE — Telephone Encounter (Signed)
Refill complete 

## 2020-01-03 NOTE — Telephone Encounter (Signed)
New message    Patient cant get to [facility] to pick up meds, needs prescriptions transferred to [facility]   *STAT* If patient is at the pharmacy, call can be transferred to refill team.   1. Which medications need to be refilled? (please list name of each medication and dose if known)  atorvastatin (LIPITOR) 40 MG tablet Take 1 tablet (40 mg total) by mouth daily.    carvedilol (COREG) 3.125 MG tablet Take 1 tablet (3.125 mg total) by mouth 2 (two) times daily.     2. Which pharmacy/location (including street and city if local pharmacy) is medication to be sent to? [facility] in [place]   3. Do they need a 30 day or 90 day supply? 90

## 2020-01-10 ENCOUNTER — Other Ambulatory Visit: Payer: Self-pay | Admitting: Cardiology

## 2020-01-10 NOTE — Telephone Encounter (Signed)
New message     Pt c/o medication issue:  1. Name of Medication:  atorvastatin (LIPITOR) 40 MG tablet Take 1 tablet (40 mg total) by mouth daily.   carvedilol (COREG) 3.125 MG tablet    2. How are you currently taking this medication (dosage and times per day)?  As prescribed   3. Are you having a reaction (difficulty breathing--STAT)?  yes  4. What is your medication issue?  He is having severe dizziness with this medication when he takes it, he cant stand up

## 2020-01-10 NOTE — Telephone Encounter (Signed)
I asked patient to take atorvastatin at dinner and to check BP and HR after he takes his Coreg in the am.He was unable to do so since he was at work.He will call back in the am.

## 2020-03-06 ENCOUNTER — Observation Stay (HOSPITAL_COMMUNITY): Payer: Self-pay

## 2020-03-06 ENCOUNTER — Emergency Department (HOSPITAL_COMMUNITY): Payer: Self-pay

## 2020-03-06 ENCOUNTER — Inpatient Hospital Stay (HOSPITAL_COMMUNITY)
Admission: EM | Admit: 2020-03-06 | Discharge: 2020-03-11 | DRG: 038 | Disposition: A | Discharge status: Home Health | Payer: Self-pay | Attending: Internal Medicine | Admitting: Internal Medicine

## 2020-03-06 ENCOUNTER — Encounter (HOSPITAL_COMMUNITY): Payer: Self-pay

## 2020-03-06 ENCOUNTER — Other Ambulatory Visit: Payer: Self-pay

## 2020-03-06 DIAGNOSIS — R4701 Aphasia: Secondary | ICD-10-CM | POA: Diagnosis present

## 2020-03-06 DIAGNOSIS — I110 Hypertensive heart disease with heart failure: Secondary | ICD-10-CM | POA: Diagnosis present

## 2020-03-06 DIAGNOSIS — F1010 Alcohol abuse, uncomplicated: Secondary | ICD-10-CM | POA: Diagnosis present

## 2020-03-06 DIAGNOSIS — I63412 Cerebral infarction due to embolism of left middle cerebral artery: Principal | ICD-10-CM | POA: Diagnosis present

## 2020-03-06 DIAGNOSIS — Z8673 Personal history of transient ischemic attack (TIA), and cerebral infarction without residual deficits: Secondary | ICD-10-CM

## 2020-03-06 DIAGNOSIS — Z7982 Long term (current) use of aspirin: Secondary | ICD-10-CM

## 2020-03-06 DIAGNOSIS — F1410 Cocaine abuse, uncomplicated: Secondary | ICD-10-CM | POA: Diagnosis present

## 2020-03-06 DIAGNOSIS — I2510 Atherosclerotic heart disease of native coronary artery without angina pectoris: Secondary | ICD-10-CM | POA: Diagnosis present

## 2020-03-06 DIAGNOSIS — E46 Unspecified protein-calorie malnutrition: Secondary | ICD-10-CM | POA: Diagnosis present

## 2020-03-06 DIAGNOSIS — E785 Hyperlipidemia, unspecified: Secondary | ICD-10-CM | POA: Diagnosis present

## 2020-03-06 DIAGNOSIS — Z681 Body mass index (BMI) 19 or less, adult: Secondary | ICD-10-CM

## 2020-03-06 DIAGNOSIS — I5022 Chronic systolic (congestive) heart failure: Secondary | ICD-10-CM | POA: Diagnosis present

## 2020-03-06 DIAGNOSIS — Z79899 Other long term (current) drug therapy: Secondary | ICD-10-CM

## 2020-03-06 DIAGNOSIS — Z9119 Patient's noncompliance with other medical treatment and regimen: Secondary | ICD-10-CM

## 2020-03-06 DIAGNOSIS — E871 Hypo-osmolality and hyponatremia: Secondary | ICD-10-CM | POA: Diagnosis present

## 2020-03-06 DIAGNOSIS — Z720 Tobacco use: Secondary | ICD-10-CM | POA: Diagnosis present

## 2020-03-06 DIAGNOSIS — G8191 Hemiplegia, unspecified affecting right dominant side: Secondary | ICD-10-CM | POA: Diagnosis present

## 2020-03-06 DIAGNOSIS — I426 Alcoholic cardiomyopathy: Secondary | ICD-10-CM | POA: Diagnosis present

## 2020-03-06 DIAGNOSIS — Z9081 Acquired absence of spleen: Secondary | ICD-10-CM

## 2020-03-06 DIAGNOSIS — I63322 Cerebral infarction due to thrombosis of left anterior cerebral artery: Secondary | ICD-10-CM

## 2020-03-06 DIAGNOSIS — I252 Old myocardial infarction: Secondary | ICD-10-CM

## 2020-03-06 DIAGNOSIS — I6522 Occlusion and stenosis of left carotid artery: Secondary | ICD-10-CM | POA: Diagnosis present

## 2020-03-06 DIAGNOSIS — I10 Essential (primary) hypertension: Secondary | ICD-10-CM | POA: Diagnosis present

## 2020-03-06 DIAGNOSIS — F1020 Alcohol dependence, uncomplicated: Secondary | ICD-10-CM

## 2020-03-06 DIAGNOSIS — R29703 NIHSS score 3: Secondary | ICD-10-CM | POA: Diagnosis present

## 2020-03-06 DIAGNOSIS — I639 Cerebral infarction, unspecified: Secondary | ICD-10-CM | POA: Diagnosis present

## 2020-03-06 DIAGNOSIS — Z20822 Contact with and (suspected) exposure to covid-19: Secondary | ICD-10-CM | POA: Diagnosis present

## 2020-03-06 DIAGNOSIS — J439 Emphysema, unspecified: Secondary | ICD-10-CM | POA: Diagnosis present

## 2020-03-06 DIAGNOSIS — Z9114 Patient's other noncompliance with medication regimen: Secondary | ICD-10-CM

## 2020-03-06 DIAGNOSIS — F17210 Nicotine dependence, cigarettes, uncomplicated: Secondary | ICD-10-CM | POA: Diagnosis present

## 2020-03-06 DIAGNOSIS — Z955 Presence of coronary angioplasty implant and graft: Secondary | ICD-10-CM

## 2020-03-06 DIAGNOSIS — I63512 Cerebral infarction due to unspecified occlusion or stenosis of left middle cerebral artery: Secondary | ICD-10-CM | POA: Diagnosis present

## 2020-03-06 LAB — COMPREHENSIVE METABOLIC PANEL
ALT: 10 U/L (ref 0–44)
AST: 18 U/L (ref 15–41)
Albumin: 4.2 g/dL (ref 3.5–5.0)
Alkaline Phosphatase: 67 U/L (ref 38–126)
Anion gap: 10 (ref 5–15)
BUN: 22 mg/dL (ref 8–23)
CO2: 25 mmol/L (ref 22–32)
Calcium: 9.5 mg/dL (ref 8.9–10.3)
Chloride: 101 mmol/L (ref 98–111)
Creatinine, Ser: 0.93 mg/dL (ref 0.61–1.24)
GFR, Estimated: >60 mL/min (ref >60)
Glucose, Bld: 121 mg/dL — ABNORMAL HIGH (ref 70–99)
Potassium: 4.1 mmol/L (ref 3.5–5.1)
Sodium: 136 mmol/L (ref 135–145)
Total Bilirubin: 0.6 mg/dL (ref 0.3–1.2)
Total Protein: 7.6 g/dL (ref 6.5–8.1)

## 2020-03-06 LAB — CBC
HCT: 44.6 % (ref 39.0–52.0)
Hemoglobin: 15.4 g/dL (ref 13.0–17.0)
MCH: 31.9 pg (ref 26.0–34.0)
MCHC: 34.5 g/dL (ref 30.0–36.0)
MCV: 92.3 fL (ref 80.0–100.0)
Platelets: 202 10*3/uL (ref 150–400)
RBC: 4.83 MIL/uL (ref 4.22–5.81)
RDW: 13.6 % (ref 11.5–15.5)
WBC: 5.9 10*3/uL (ref 4.0–10.5)
nRBC: 0 % (ref 0.0–0.2)

## 2020-03-06 LAB — PROTIME-INR
INR: 1 (ref 0.8–1.2)
Prothrombin Time: 12.9 seconds (ref 11.4–15.2)

## 2020-03-06 LAB — APTT: aPTT: 33 seconds (ref 24–36)

## 2020-03-06 LAB — CBG MONITORING, ED: Glucose-Capillary: 123 mg/dL — ABNORMAL HIGH (ref 70–99)

## 2020-03-06 LAB — CBC WITH DIFFERENTIAL/PLATELET
Abs Immature Granulocytes: 0.02 10*3/uL (ref 0.00–0.07)
Basophils Absolute: 0 10*3/uL (ref 0.0–0.1)
Basophils Relative: 1 %
Eosinophils Absolute: 0.1 10*3/uL (ref 0.0–0.5)
Eosinophils Relative: 3 %
HCT: 44.7 % (ref 39.0–52.0)
Hemoglobin: 14.7 g/dL (ref 13.0–17.0)
Immature Granulocytes: 0 %
Lymphocytes Relative: 26 %
Lymphs Abs: 1.4 10*3/uL (ref 0.7–4.0)
MCH: 30.9 pg (ref 26.0–34.0)
MCHC: 32.9 g/dL (ref 30.0–36.0)
MCV: 93.9 fL (ref 80.0–100.0)
Monocytes Absolute: 0.8 10*3/uL (ref 0.1–1.0)
Monocytes Relative: 15 %
Neutro Abs: 3 10*3/uL (ref 1.7–7.7)
Neutrophils Relative %: 55 %
Platelets: 193 10*3/uL (ref 150–400)
RBC: 4.76 MIL/uL (ref 4.22–5.81)
RDW: 13.8 % (ref 11.5–15.5)
WBC: 5.3 10*3/uL (ref 4.0–10.5)
nRBC: 0 % (ref 0.0–0.2)

## 2020-03-06 LAB — RESP PANEL BY RT-PCR (FLU A&B, COVID) ARPGX2
Influenza A by PCR: NEGATIVE
Influenza B by PCR: NEGATIVE
SARS Coronavirus 2 by RT PCR: NEGATIVE

## 2020-03-06 LAB — LACTIC ACID, PLASMA
Lactic Acid, Venous: 1 mmol/L (ref 0.5–1.9)
Lactic Acid, Venous: 0.7 mmol/L (ref 0.5–1.9)

## 2020-03-06 MED ORDER — ASPIRIN 81 MG PO CHEW
324.0000 mg | CHEWABLE_TABLET | Freq: Once | ORAL | Status: AC
  Administered 2020-03-06: 324 mg via ORAL
  Filled 2020-03-06: qty 4

## 2020-03-06 MED ORDER — ENOXAPARIN SODIUM 40 MG/0.4ML ~~LOC~~ SOLN
40.0000 mg | SUBCUTANEOUS | Status: DC
  Administered 2020-03-06 – 2020-03-10 (×5): 40 mg via SUBCUTANEOUS
  Filled 2020-03-06 (×5): qty 0.4

## 2020-03-06 MED ORDER — SODIUM CHLORIDE 0.9 % IV SOLN: INTRAVENOUS | Status: DC

## 2020-03-06 MED ORDER — ASPIRIN 81 MG PO CHEW
81.0000 mg | CHEWABLE_TABLET | Freq: Every day | ORAL | Status: DC
  Administered 2020-03-07 – 2020-03-11 (×4): 81 mg via ORAL
  Filled 2020-03-06 (×5): qty 1

## 2020-03-06 MED ORDER — ATORVASTATIN CALCIUM 40 MG PO TABS
40.0000 mg | ORAL_TABLET | Freq: Every day | ORAL | Status: DC
  Administered 2020-03-07 – 2020-03-11 (×4): 40 mg via ORAL
  Filled 2020-03-06 (×5): qty 1

## 2020-03-06 MED ORDER — CLOPIDOGREL BISULFATE 75 MG PO TABS
300.0000 mg | ORAL_TABLET | Freq: Once | ORAL | Status: AC
  Administered 2020-03-06: 300 mg via ORAL
  Filled 2020-03-06: qty 4

## 2020-03-06 MED ORDER — PRAVASTATIN SODIUM 10 MG PO TABS: 10.0000 mg | ORAL_TABLET | Freq: Every day | ORAL | Status: DC

## 2020-03-06 MED ORDER — ACETAMINOPHEN 325 MG PO TABS: 650.0000 mg | ORAL_TABLET | ORAL | Status: DC | PRN

## 2020-03-06 MED ORDER — SODIUM CHLORIDE 0.9 % IV SOLN
100.0000 mL/h | INTRAVENOUS | Status: DC
  Administered 2020-03-06 – 2020-03-08 (×4): 100 mL/h via INTRAVENOUS

## 2020-03-06 MED ORDER — CLOPIDOGREL BISULFATE 75 MG PO TABS
75.0000 mg | ORAL_TABLET | Freq: Every day | ORAL | Status: DC
  Administered 2020-03-07 – 2020-03-11 (×4): 75 mg via ORAL
  Filled 2020-03-06 (×5): qty 1

## 2020-03-06 MED ORDER — STROKE: EARLY STAGES OF RECOVERY BOOK
Freq: Once | Status: AC
  Filled 2020-03-06: qty 1

## 2020-03-06 MED ORDER — ACETAMINOPHEN 650 MG RE SUPP: 650.0000 mg | RECTAL | Status: DC | PRN

## 2020-03-06 MED ORDER — ACETAMINOPHEN 160 MG/5ML PO SOLN: 650.0000 mg | ORAL | Status: DC | PRN

## 2020-03-06 MED ORDER — ATORVASTATIN CALCIUM 40 MG PO TABS
40.0000 mg | ORAL_TABLET | Freq: Every day | ORAL | Status: DC
  Administered 2020-03-06: 40 mg via ORAL
  Filled 2020-03-06: qty 1

## 2020-03-06 MED ORDER — CARVEDILOL 3.125 MG PO TABS
3.1250 mg | ORAL_TABLET | Freq: Two times a day (BID) | ORAL | Status: DC
  Administered 2020-03-06 – 2020-03-11 (×9): 3.125 mg via ORAL
  Filled 2020-03-06 (×11): qty 1

## 2020-03-06 MED ORDER — SENNOSIDES-DOCUSATE SODIUM 8.6-50 MG PO TABS: 1.0000 | ORAL_TABLET | Freq: Every evening | ORAL | Status: DC | PRN

## 2020-03-06 MED ORDER — SODIUM CHLORIDE 0.9 % IV BOLUS
500.0000 mL | Freq: Once | INTRAVENOUS | Status: AC
  Administered 2020-03-06: 500 mL via INTRAVENOUS

## 2020-03-06 NOTE — H&P (Signed)
[facility] History & Physical  Rob Tax adviser MD  ALGIE WESTRY 03/06/2020  Chief Complaint: Altered speech, mentation HPI: The patient is a 59 y.o. year-old w/ hx of HTN, MI, CVA, cocaine, severe 2V CAD w/ PCI to RCA 2018,  etoh cardiomyopathy, tobacco abuse presented to ED at [facility] today w/ altered mental status and slurred speech, hasn't felt right since yesterday.  In ED pt was having sig difficulties w/ speaking.  MRI done which showed concern for left anterior MCA stroke, acute. ED d/w neurology Dr Gerilyn Pilgrim, plan is to start patient on aspirin, plavix. Asked to see for admission.   Pt seen in the ED. Pt states is speaking much better now, at 8:15 pm. He denies any muscle weakness, numbness or visual symptoms. ROS as below.   Pt lives in [place], works as a Curator, lives alone, no family.  2 ppd tobacco use and 5-10 beers per day, never has had DT's or the shakes.    Chart review - shows last admit in 2019 for chest pain, ruled out for ACS. Pt did not complete his course of meds from his prior PCI in Jan 2018. In March 2019 had stress test and EF was 30%, left AMA prior to test results.  They recommended medical Rx given hist noncompliance would not do cath unless very high risk situation.    ROS  denies CP  no joint pain   no HA  no blurry vision  no rash  no diarrhea  no nausea/ vomiting  no dysuria  no difficulty voiding  no change in urine color   Past Medical History  Past Medical History:  Diagnosis Date  . Alcohol abuse   . CAD (coronary artery disease)    a. 2014 cath with non obst CAD, EF 35%.    b. 04/2016: inferior STEMI s/p DES to RCA. 70% LAD with no intervention.  . CHF (congestive heart failure) (HCC)   . Cocaine abuse (HCC)   . History of noncompliance with medical treatment, presenting hazards to health   . Hypertension   . Myocardial infarction (HCC)   . Pneumothorax   . Stroke [facility])    Past Surgical History  Past Surgical History:   Procedure Laterality Date  . ABDOMINAL SURGERY    . CARDIAC CATHETERIZATION N/A 04/20/2016   Procedure: Left Heart Cath and Coronary Angiography;  Surgeon: Marykay Lex, MD;  Location: [facility] INVASIVE CV LAB;  Service: Cardiovascular;  Laterality: N/A;  . CARDIAC CATHETERIZATION N/A 04/20/2016   Procedure: Coronary Stent Intervention;  Surgeon: Marykay Lex, MD;  Location: [facility] INVASIVE CV LAB;  Service: Cardiovascular;  Laterality: N/A;  . COLON SURGERY    . LEFT HEART CATHETERIZATION WITH CORONARY ANGIOGRAM N/A 03/08/2013   Procedure: LEFT HEART CATHETERIZATION WITH CORONARY ANGIOGRAM;  Surgeon: Micheline Chapman, MD;  Location: [facility] CATH LAB;  Service: Cardiovascular;  Laterality: N/A;  . LIVER SURGERY    . SPLENECTOMY     Family History  Family History  Problem Relation Age of Onset  . Heart attack Mother   . Diabetes Mother   . Stroke Mother   . Heart attack Father   . Diabetes Father    Social History  reports that he has been smoking cigarettes. He has a 67.50 pack-year smoking history. He has never used smokeless tobacco. He reports current alcohol use. He reports current drug use. Drug: Cocaine. Allergies No Known Allergies Home medications Prior to Admission medications   Medication Sig Start  Date End Date Taking? Authorizing Provider  aspirin EC 81 MG tablet Take 1 tablet (81 mg total) by mouth daily. Swallow whole. 10/26/19   Strader, Lennart Pall, PA-C  atorvastatin (LIPITOR) 40 MG tablet Take 1 tablet (40 mg total) by mouth daily. 01/03/20 04/02/20  Strader, Lennart Pall, PA-C  carvedilol (COREG) 3.125 MG tablet Take 1 tablet (3.125 mg total) by mouth 2 (two) times daily. 01/03/20 04/02/20  Ellsworth Lennox, PA-C       Exam Gen alert, no distress, disheveled and covered in motor oil No rash, cyanosis or gangrene Sclera anicteric, throat clear No jvd or bruits Chest clear bilat RRR no MRG Abd soft ntnd no mass or ascites +bs GU normal male MS no joint effusions or  deformity Ext no LE edema, no wounds or ulcers Neuro is alert, Ox 3  CN 2-12 intact, speech good , judgement intact   No UE or LE weakness    No VF deficits    Memory intact    Home meds:  - asa 81/ lipitor 40 / coreg 3.125 bid  CT head - IMPRESSION: Infarction changes within the left insula/subinsular region and adjacent left frontal lobe, which may be subacute or chronic. Consider brain MRI for further evaluation.  Chronic appearing cortically based infarcts more posteriorly within the left frontal and parietal lobes.   MRI/ MRA - IMPRESSION: 1. Acute anterior left MCA territory infarcts with associated severe stenosis of the superior left M2 MCA branch.  2. Asymmetric chronic microvascular ischemic change involving the left MCA territory with a remote infarct in the left parietal lobe. These findings suggest an element of chronic ischemia/hypoperfusion to the left MCA territory.           Assessment/ Plan: 1. Acute CVA - w/ altered speech/ aphasia, onset 12/5, improving here this afternoon. Speech appears intact now. MRI/ MRA showing L insula/ subinsular/ adjacent frontal lobe "which may be subacute or chronic".  Per neurology has started high dose aspirin 324mg  and plavix 75mg . Admit for full CVA w/u.  Consulting Dr who is aware.   2. HTN - cont coreg 3. HL - cont lipitor 4. Etoh abuse -denies hx of DT's, observe for problems.       MD 03/06/2020, 7:34 PM

## 2020-03-06 NOTE — ED Provider Notes (Signed)
[facility] EMERGENCY DEPARTMENT Provider Note   CSN: [id number] Arrival date & time: 03/06/20  1405     History Chief Complaint  Patient presents with  . Altered Mental Status    Era Craig Perry is a 59 y.o. male.  HPI Patient presents with concern of change in mental status. Patient himself stumbles and speech when trying to describe what is wrong, how he is feeling, or how long he has felt differently from normal. He seemingly works at a [employer], cannot specify when the symptoms may have started, or what they consist of. He seemingly denies pain, that he reportedly complained of chest pain to nursing staff. Level 5 caveat secondary to change in mentation      Past Medical History:  Diagnosis Date  . Alcohol abuse   . CAD (coronary artery disease)    a. 2014 cath with non obst CAD, EF 35%.    b. 04/2016: inferior STEMI s/p DES to RCA. 70% LAD with no intervention.  . CHF (congestive heart failure) (HCC)   . Cocaine abuse (HCC)   . History of noncompliance with medical treatment, presenting hazards to health   . Hypertension   . Myocardial infarction (HCC)   . Pneumothorax   . Stroke [facility](HCC)     Patient Active Problem List   Diagnosis Date Noted  . Angina pectoris (HCC) 11/18/2017  . Alcohol use disorder, moderate, dependence (HCC) 07/22/2017  . Major depressive disorder, recurrent severe without psychotic features (HCC) 07/21/2017  . CAD (coronary artery disease) 06/16/2017  . Acute on chronic combined systolic and diastolic CHF (congestive heart failure) (HCC)   . Hypertension   . ST elevation myocardial infarction (STEMI) of inferior wall, initial episode of care [facility](HCC): Type I MI 04/20/2016  . Alcoholic cardiomyopathy (HCC) 78/29/562112/10/2012  . Chest pain 03/07/2013  . Tobacco abuse 03/07/2013  . ETOH abuse 03/07/2013  . Unintentional weight loss 03/07/2013    Past Surgical History:  Procedure Laterality Date  . ABDOMINAL SURGERY    . CARDIAC  CATHETERIZATION N/A 04/20/2016   Procedure: Left Heart Cath and Coronary Angiography;  Surgeon: Marykay Lexavid W Harding, MD;  [facility];  Service: Cardiovascular;  Laterality: N/A;  . CARDIAC CATHETERIZATION N/A 04/20/2016   Procedure: Coronary Stent Intervention;  Surgeon: Marykay Lexavid W Harding, MD;  [facility];  Service: Cardiovascular;  Laterality: N/A;  . COLON SURGERY    . LEFT HEART CATHETERIZATION WITH CORONARY ANGIOGRAM N/A 03/08/2013   Procedure: LEFT HEART CATHETERIZATION WITH CORONARY ANGIOGRAM;  Surgeon: Micheline ChapmanMichael D Cooper, MD;  [facility];  Service: Cardiovascular;  Laterality: N/A;  . LIVER SURGERY    . SPLENECTOMY         Family History  Problem Relation Age of Onset  . Heart attack Mother   . Diabetes Mother   . Stroke Mother   . Heart attack Father   . Diabetes Father     Social History   Tobacco Use  . Smoking status: Current Every Day Smoker    Packs/day: 1.50    Years: 45.00    Pack years: 67.50    Types: Cigarettes  . Smokeless tobacco: Never Used  Vaping Use  . Vaping Use: Former  Substance Use Topics  . Alcohol use: Yes    Comment: 6 beers a day.  formerly 18/day  . Drug use: Yes    Types: Cocaine    Home Medications Prior to Admission medications   Medication Sig Start  Date End Date Taking? Authorizing Provider  aspirin EC 81 MG tablet Take 1 tablet (81 mg total) by mouth daily. Swallow whole. 10/26/19   Strader, Lennart Pall, PA-C  atorvastatin (LIPITOR) 40 MG tablet Take 1 tablet (40 mg total) by mouth daily. 01/03/20 04/02/20  Strader, Lennart Pall, PA-C  carvedilol (COREG) 3.125 MG tablet Take 1 tablet (3.125 mg total) by mouth 2 (two) times daily. 01/03/20 04/02/20  Ellsworth Lennox, PA-C    Allergies    Patient has no known allergies.  Review of Systems   Review of Systems  Unable to perform ROS: Mental status change    Physical Exam Updated Vital Signs BP (!) 159/96   Pulse 96   Resp (!) 28   SpO2 97%    Physical Exam Vitals and nursing note reviewed.  Constitutional:      Appearance: He is well-developed.     Comments: Thin, unkempt adult male awake and alert, though with slow, withdrawn affect.  HENT:     Head: Normocephalic and atraumatic.  Eyes:     Conjunctiva/sclera: Conjunctivae normal.  Cardiovascular:     Rate and Rhythm: Normal rate and regular rhythm.  Pulmonary:     Effort: Pulmonary effort is normal. No respiratory distress.     Breath sounds: No stridor.  Abdominal:     General: There is no distension.  Skin:    General: Skin is warm and dry.  Neurological:     Mental Status: He is alert.     Cranial Nerves: No cranial nerve deficit.     Motor: Tremor and atrophy present.     Coordination: Heel to [facility] Test normal.     Comments: Slow, repetitive speech. Otherwise cranial nerves unremarkable.     ED Results / Procedures / Treatments   Labs (all labs ordered are listed, but only abnormal results are displayed) Labs Reviewed  COMPREHENSIVE METABOLIC PANEL - Abnormal; Notable for the following components:      Result Value   Glucose, Bld 121 (*)    All other components within normal limits  CBG MONITORING, ED - Abnormal; Notable for the following components:   Glucose-Capillary 123 (*)    All other components within normal limits  RESP PANEL BY RT-PCR (FLU A&B, COVID) [id number]  CBC  LACTIC ACID, PLASMA  LACTIC ACID, PLASMA  PROTIME-INR  APTT  DIFFERENTIAL  RAPID URINE DRUG SCREEN, HOSP PERFORMED  URINALYSIS, ROUTINE W REFLEX MICROSCOPIC  I-STAT CHEM 8, ED    EKG None  Radiology CT Head Wo Contrast  Result Date: 03/06/2020 CLINICAL DATA:  Delirium. Additional history provided: Slow speech, chest pain. EXAM: CT HEAD WITHOUT CONTRAST TECHNIQUE: Contiguous axial images were obtained from the base of the skull through the vertex without intravenous contrast. COMPARISON:  Head CT 05/03/2011. FINDINGS: Brain: Mild cerebral and cerebellar atrophy.  Infarction changes within the left insula and subinsular region, as well as cortical/subcortical mid left frontal lobe/operculum, which may be subacute or chronic. These infarcts are overall small to moderate-sized. Chronic appearing cortically based infarcts more posteriorly within the left frontal and parietal lobes. There is no acute intracranial hemorrhage. No extra-axial fluid collection. No evidence of intracranial mass. No midline shift. Vascular: No hyperdense vessel.  Atherosclerotic calcifications. Skull: Normal. Negative for fracture or focal lesion. Sinuses/Orbits: Visualized orbits show no acute finding. No significant paranasal sinus disease at the imaged levels Other: 1.4 cm lipoma within the right temporoparietal scalp (series 2, image 19). IMPRESSION: Infarction changes within the left insula/subinsular region  and adjacent left frontal lobe, which may be subacute or chronic. Consider brain MRI for further evaluation. Chronic appearing cortically based infarcts more posteriorly within the left frontal and parietal lobes. Mild parenchymal atrophy. Electronically Signed   By: Jackey Loge DO   On: 03/06/2020 15:55   MR ANGIO HEAD WO CONTRAST  Addendum Date: 03/06/2020   ADDENDUM REPORT: 03/06/2020 17:13 ADDENDUM: Findings discussed with Dr. Jeraldine Loots via telephone at 5:08 PM. Electronically Signed   By: Feliberto Harts MD   On: 03/06/2020 17:13   Result Date: 03/06/2020 CLINICAL DATA:  Neuro deficit, acute stroke suspected. EXAM: MRI HEAD WITHOUT CONTRAST MRA HEAD WITHOUT CONTRAST TECHNIQUE: Multiplanar, multiecho pulse sequences of the brain and surrounding structures were obtained without intravenous contrast. Angiographic images of the head were obtained using MRA technique without contrast. COMPARISON:  Same day CT head. FINDINGS: MRI HEAD FINDINGS Brain: Acute infarcts in the anterior left MCA territory, including the anterior insula and overlying frontal lobe cortex and subcortical white  matter. Mild associated edema without substantial mass effect. There is asymmetric chronic microvascular ischemic change involving the left MCA territory with a remote infarct in the left parietal lobe. No acute hemorrhage. No hydrocephalus. No mass lesion. Skull and upper cervical spine: Normal marrow signal. Lipoma of the right scalp. Sinuses/Orbits: Mild mucosal thickening without air-fluid levels. Unremarkable orbits. MRA HEAD FINDINGS Anterior circulation: Bilateral internal carotid arteries are patent bilateral M1 MCAs are patent. There is focal severe stenosis of the left proximal superior M2 MCA branch, which supplies the region of acute infarct detailed above. The right MCA and bilateral ACA branches are patent without evidence of hemodynamically significant proximal stenosis. No aneurysm identified. Posterior circulation: Bilateral intradural vertebral arteries and basilar artery are patent. Bilateral posterior cerebral arteries are patent without evidence of hemodynamically significant proximal stenosis. IMPRESSION: 1. Acute anterior left MCA territory infarcts with associated severe stenosis of the superior left M2 MCA branch. 2. Asymmetric chronic microvascular ischemic change involving the left MCA territory with a remote infarct in the left parietal lobe. These findings suggest an element of chronic ischemia/hypoperfusion to the left MCA territory. Electronically Signed: By: Feliberto Harts MD On: 03/06/2020 17:00   MR Brain Wo Contrast (neuro protocol)  Addendum Date: 03/06/2020   ADDENDUM REPORT: 03/06/2020 17:13 ADDENDUM: Findings discussed with Dr. Jeraldine Loots via telephone at 5:08 PM. Electronically Signed   By: Feliberto Harts MD   On: 03/06/2020 17:13   Result Date: 03/06/2020 CLINICAL DATA:  Neuro deficit, acute stroke suspected. EXAM: MRI HEAD WITHOUT CONTRAST MRA HEAD WITHOUT CONTRAST TECHNIQUE: Multiplanar, multiecho pulse sequences of the brain and surrounding structures were  obtained without intravenous contrast. Angiographic images of the head were obtained using MRA technique without contrast. COMPARISON:  Same day CT head. FINDINGS: MRI HEAD FINDINGS Brain: Acute infarcts in the anterior left MCA territory, including the anterior insula and overlying frontal lobe cortex and subcortical white matter. Mild associated edema without substantial mass effect. There is asymmetric chronic microvascular ischemic change involving the left MCA territory with a remote infarct in the left parietal lobe. No acute hemorrhage. No hydrocephalus. No mass lesion. Skull and upper cervical spine: Normal marrow signal. Lipoma of the right scalp. Sinuses/Orbits: Mild mucosal thickening without air-fluid levels. Unremarkable orbits. MRA HEAD FINDINGS Anterior circulation: Bilateral internal carotid arteries are patent bilateral M1 MCAs are patent. There is focal severe stenosis of the left proximal superior M2 MCA branch, which supplies the region of acute infarct detailed above. The right MCA and  bilateral ACA branches are patent without evidence of hemodynamically significant proximal stenosis. No aneurysm identified. Posterior circulation: Bilateral intradural vertebral arteries and basilar artery are patent. Bilateral posterior cerebral arteries are patent without evidence of hemodynamically significant proximal stenosis. IMPRESSION: 1. Acute anterior left MCA territory infarcts with associated severe stenosis of the superior left M2 MCA branch. 2. Asymmetric chronic microvascular ischemic change involving the left MCA territory with a remote infarct in the left parietal lobe. These findings suggest an element of chronic ischemia/hypoperfusion to the left MCA territory. Electronically Signed: By: Feliberto Harts MD On: 03/06/2020 17:00    Procedures Procedures (including critical care time)  Medications Ordered in ED Medications  sodium chloride 0.9 % bolus 500 mL (has no administration in  time range)    Followed by  0.9 %  sodium chloride infusion (has no administration in time range)  aspirin chewable tablet 324 mg (has no administration in time range)  clopidogrel (PLAVIX) tablet 300 mg (has no administration in time range)  pravastatin (PRAVACHOL) tablet 10 mg (has no administration in time range)    ED Course  I have reviewed the triage vital signs and the nursing notes.  Pertinent labs & imaging results that were available during my care of the patient were reviewed by me and considered in my medical decision making (see chart for details).   I discussed the patient's imaging studies with the radiology team and the patient was getting his scans performed. Line with concern for right temporal parietal lesion, left insular stroke, patient will have MRI to complement his initial CT scan. MDM Rules/Calculators/A&P                          6:13 PM On repeat evaluation the patient is in similar condition.  He continues to move all extremities spontaneously.  He notes again that he has felt unwell since yesterday.  I have discussed the patient's case with our radiology colleagues given finding of abnormal MRI with concern for left anterior MCA stroke, stenosis of the proximal M2 segment of the MCA, as well as evidence for chronic infarct posteriorly. After this I discussed the patient case with our neurology, Dr. Gerilyn Pilgrim. Patient will start appropriate therapy, aspirin, Plavix, steroids.  Patient will require admission for additional evaluation, monitoring, management of his new stroke. Final Clinical Impression(s) / ED Diagnoses Final diagnoses:  Cerebrovascular accident (CVA) due to thrombosis of left anterior cerebral artery [facility])     Gerhard Munch, MD 03/06/20 1814

## 2020-03-06 NOTE — ED Triage Notes (Signed)
Pt unable to answer triage questions, slow speech. States he is having chest pain. Extremely unkept. Denies taking any medication.

## 2020-03-06 NOTE — Progress Notes (Signed)
Ok to change pravastatin to home atorvastatin 40mg  per Dr. .  Jeraldine Loots, PharmD, BCIDP, AAHIVP, CPP Infectious Disease Pharmacist 03/06/2020 6:25 PM

## 2020-03-07 ENCOUNTER — Encounter (HOSPITAL_COMMUNITY): Payer: Self-pay | Admitting: Nephrology

## 2020-03-07 ENCOUNTER — Observation Stay (HOSPITAL_COMMUNITY): Payer: Self-pay

## 2020-03-07 DIAGNOSIS — F1410 Cocaine abuse, uncomplicated: Secondary | ICD-10-CM | POA: Diagnosis present

## 2020-03-07 DIAGNOSIS — I5022 Chronic systolic (congestive) heart failure: Secondary | ICD-10-CM | POA: Diagnosis present

## 2020-03-07 DIAGNOSIS — I6389 Other cerebral infarction: Secondary | ICD-10-CM

## 2020-03-07 LAB — COMPREHENSIVE METABOLIC PANEL
ALT: 9 U/L (ref 0–44)
AST: 15 U/L (ref 15–41)
Albumin: 3.8 g/dL (ref 3.5–5.0)
Alkaline Phosphatase: 62 U/L (ref 38–126)
Anion gap: 8 (ref 5–15)
BUN: 16 mg/dL (ref 8–23)
CO2: 24 mmol/L (ref 22–32)
Calcium: 9 mg/dL (ref 8.9–10.3)
Chloride: 101 mmol/L (ref 98–111)
Creatinine, Ser: 0.72 mg/dL (ref 0.61–1.24)
GFR, Estimated: >60 mL/min (ref >60)
Glucose, Bld: 92 mg/dL (ref 70–99)
Potassium: 3.8 mmol/L (ref 3.5–5.1)
Sodium: 133 mmol/L — ABNORMAL LOW (ref 135–145)
Total Bilirubin: 0.6 mg/dL (ref 0.3–1.2)
Total Protein: 6.8 g/dL (ref 6.5–8.1)

## 2020-03-07 LAB — ECHOCARDIOGRAM COMPLETE
AR max vel: 2.51 cm2
AV Area VTI: 2.31 cm2
AV Area mean vel: 2.36 cm2
AV Mean grad: 3.1 mmHg
AV Peak grad: 5.3 mmHg
Ao pk vel: 1.16 m/s
Area-P 1/2: 3.68 cm2
S' Lateral: 4.5 cm
Weight: 2239.87 oz

## 2020-03-07 LAB — CBC
HCT: 43 % (ref 39.0–52.0)
Hemoglobin: 14.2 g/dL (ref 13.0–17.0)
MCH: 31.1 pg (ref 26.0–34.0)
MCHC: 33 g/dL (ref 30.0–36.0)
MCV: 94.1 fL (ref 80.0–100.0)
Platelets: 183 10*3/uL (ref 150–400)
RBC: 4.57 MIL/uL (ref 4.22–5.81)
RDW: 13.3 % (ref 11.5–15.5)
WBC: 5.2 10*3/uL (ref 4.0–10.5)
nRBC: 0 % (ref 0.0–0.2)

## 2020-03-07 LAB — HIV ANTIBODY (ROUTINE TESTING W REFLEX): HIV Screen 4th Generation wRfx: NONREACTIVE

## 2020-03-07 LAB — MAGNESIUM: Magnesium: 2.3 mg/dL (ref 1.7–2.4)

## 2020-03-07 LAB — LIPID PANEL
Cholesterol: 137 mg/dL (ref 0–200)
HDL: 36 mg/dL — ABNORMAL LOW (ref >40)
LDL Cholesterol: 80 mg/dL (ref 0–99)
Total CHOL/HDL Ratio: 3.8 RATIO
Triglycerides: 104 mg/dL (ref <150)
VLDL: 21 mg/dL (ref 0–40)

## 2020-03-07 LAB — PHOSPHORUS: Phosphorus: 2.6 mg/dL (ref 2.5–4.6)

## 2020-03-07 LAB — HEMOGLOBIN A1C
Hgb A1c MFr Bld: 5.7 % — ABNORMAL HIGH (ref 4.8–5.6)
Mean Plasma Glucose: 116.89 mg/dL

## 2020-03-07 MED ORDER — DIAZEPAM 5 MG PO TABS
5.0000 mg | ORAL_TABLET | Freq: Once | ORAL | Status: AC
  Administered 2020-03-07: 5 mg via ORAL
  Filled 2020-03-07: qty 1

## 2020-03-07 MED ORDER — LORAZEPAM 2 MG/ML IJ SOLN: 0.0000 mg | Freq: Two times a day (BID) | INTRAMUSCULAR | Status: DC

## 2020-03-07 MED ORDER — THIAMINE HCL 100 MG PO TABS
100.0000 mg | ORAL_TABLET | Freq: Every day | ORAL | Status: DC
  Administered 2020-03-07 – 2020-03-11 (×4): 100 mg via ORAL
  Filled 2020-03-07 (×5): qty 1

## 2020-03-07 MED ORDER — IOHEXOL 350 MG/ML SOLN
100.0000 mL | Freq: Once | INTRAVENOUS | Status: AC | PRN
  Administered 2020-03-07: 75 mL via INTRAVENOUS

## 2020-03-07 MED ORDER — LORAZEPAM 1 MG PO TABS
1.0000 mg | ORAL_TABLET | ORAL | Status: AC | PRN
  Administered 2020-03-09: 1 mg via ORAL
  Filled 2020-03-07: qty 1

## 2020-03-07 MED ORDER — FOLIC ACID 1 MG PO TABS
1.0000 mg | ORAL_TABLET | Freq: Every day | ORAL | Status: DC
  Administered 2020-03-07 – 2020-03-11 (×4): 1 mg via ORAL
  Filled 2020-03-07 (×5): qty 1

## 2020-03-07 MED ORDER — NICOTINE 21 MG/24HR TD PT24
21.0000 mg | MEDICATED_PATCH | Freq: Every day | TRANSDERMAL | Status: DC
  Administered 2020-03-07 – 2020-03-09 (×2): 21 mg via TRANSDERMAL
  Filled 2020-03-07 (×5): qty 1

## 2020-03-07 MED ORDER — THIAMINE HCL 100 MG/ML IJ SOLN: 100.0000 mg | Freq: Every day | INTRAMUSCULAR | Status: DC

## 2020-03-07 MED ORDER — LORAZEPAM 2 MG/ML IJ SOLN: 1.0000 mg | INTRAMUSCULAR | Status: AC | PRN

## 2020-03-07 MED ORDER — LORAZEPAM 2 MG/ML IJ SOLN
0.0000 mg | Freq: Four times a day (QID) | INTRAMUSCULAR | Status: AC
  Administered 2020-03-07: 1 mg via INTRAVENOUS
  Administered 2020-03-07: 2 mg via INTRAVENOUS
  Filled 2020-03-07 (×2): qty 1

## 2020-03-07 MED ORDER — ADULT MULTIVITAMIN W/MINERALS CH
1.0000 | ORAL_TABLET | Freq: Every day | ORAL | Status: DC
  Administered 2020-03-07 – 2020-03-11 (×4): 1 via ORAL
  Filled 2020-03-07 (×5): qty 1

## 2020-03-07 NOTE — Progress Notes (Signed)
*  PRELIMINARY RESULTS* Echocardiogram 2D Echocardiogram has been performed.  Jeryl Columbia 03/07/2020, 11:06 AM

## 2020-03-07 NOTE — Plan of Care (Signed)
  Problem: Acute Rehab PT Goals(only PT should resolve) Goal: PT Additional Goal #1 Outcome: Completed/Met Flowsheets (Taken 03/07/2020 1053) Additional Goal #1: Pt will verbalize understanding when educated on safety with mobility and ambulation.   Talbot Grumbling PT, DPT 03/07/20, 10:53 AM [phone]

## 2020-03-07 NOTE — TOC Initial Note (Addendum)
Transition of Care [facility]) - Initial/Assessment Note    Patient Details  Name: Craig Perry MRN: [id number] Date of Birth: [birth year]  Transition of [facility]) CM/SW Contact:    Barry Brunner, LCSW Phone Number: 03/07/2020, 10:49 AM  Clinical Narrative:                 Patient is a 59 year old male admitted for Acute stroke due to occlusion of left middle cerebral artery. CSW observed patient's insurance status. Patient reported that he also did not have a PCP. CSW inquired about patient's agreeableness to [facility] and medicaid referral. Patient agreeable to referral. CSW placed referral to [facility] and medicaid. CSW received a SA consult for patient. CSW provided SA resources to patient. TOC to follow.  Expected Discharge Plan: Home/Self Care     Patient Goals and CMS Choice        Expected Discharge Plan and Services Expected Discharge Plan: Home/Self Care                                              Prior Living Arrangements/Services   Lives with:: Self Patient language and need for interpreter reviewed:: Yes Do you feel safe going back to the place where you live?: Yes      Need for Family Participation in Patient Care: Yes (Comment) Care giver support system in place?: Yes (comment)   Criminal Activity/Legal Involvement Pertinent to Current Situation/Hospitalization: No - Comment as needed  Activities of Daily Living Home Assistive Devices/Equipment: None ADL Screening (condition at time of admission) Patient's cognitive ability adequate to safely complete daily activities?: Yes Is the patient deaf or have difficulty hearing?: No Does the patient have difficulty seeing, even when wearing glasses/contacts?: No Does the patient have difficulty concentrating, remembering, or making decisions?: No Patient able to express need for assistance with ADLs?: Yes Does the patient have difficulty dressing or bathing?: No Independently performs ADLs?:  Yes (appropriate for developmental age) Does the patient have difficulty walking or climbing stairs?: No Weakness of Legs: None Weakness of Arms/Hands: None  Permission Sought/Granted Permission sought to share information with : Family Supports Permission granted to share information with : Yes, Verbal Permission Granted     Permission granted to share info w AGENCY: [facility] and Fiancial counselor        Emotional Assessment     Affect (typically observed): Accepting Orientation: : Oriented to Self, Oriented to Situation, Oriented to  Time Alcohol / Substance Use: Not Applicable Psych Involvement: No (comment)  Admission diagnosis:  CVA (cerebral vascular accident) (HCC) [I63.9] Acute CVA (cerebrovascular accident) (HCC) [I63.9] Cerebrovascular accident (CVA) due to thrombosis of left anterior cerebral artery [facility]) [I63.322] Patient Active Problem List   Diagnosis Date Noted  . Acute stroke due to occlusion of left middle cerebral artery (HCC) 03/06/2020  . Aphasia 03/06/2020  . Acute CVA (cerebrovascular accident) (HCC) 03/06/2020  . Angina pectoris (HCC) 11/18/2017  . Alcohol use disorder, moderate, dependence (HCC) 07/22/2017  . Major depressive disorder, recurrent severe without psychotic features (HCC) 07/21/2017  . CAD (coronary artery disease) 06/16/2017  . Acute on chronic combined systolic and diastolic CHF (congestive heart failure) (HCC)   . Hypertension   . ST elevation myocardial infarction (STEMI) of inferior wall, initial episode of care [facility]): Type I MI 04/20/2016  . Alcoholic cardiomyopathy (HCC) 57/84/6962  .  Chest pain 03/07/2013  . Tobacco abuse 03/07/2013  . ETOH abuse 03/07/2013  . Unintentional weight loss 03/07/2013   PCP:  Patient, No Pcp Per Pharmacy:   [facility] [address], [place] - [address] [place] [address] Phone: [phone] Fax: [phone]     Social Determinants of Health (SDOH)  Interventions    Readmission Risk Interventions No flowsheet data found.

## 2020-03-07 NOTE — Progress Notes (Signed)
Received report from AP nurse for transfer.

## 2020-03-07 NOTE — Progress Notes (Addendum)
Patient Demographics:    Craig Perry, is a 59 y.o. male, DOB - 23-Aug-1960, ZOX:[id number]  Admit date - 03/06/2020   Admitting Physician Christhoper Busbee Mariea Clonts, MD  Outpatient Primary MD for the patient is Patient, No Pcp Per  LOS - 0   Chief Complaint  Patient presents with  . Altered Mental Status        Subjective:    Craig Perry today has no fevers, no emesis,  No chest pain,   -Confusional episodes, right-sided weakness and speech difficulty appears to be improving --Patient agreeable to go to [facility] for further vascular surgery evaluation for carotid artery occlusion  Assessment  & Plan :    Principal Problem:   Acute stroke due to occlusion of left middle cerebral artery (HCC) Active Problems:   Tobacco abuse   ETOH abuse   Chronic HFrEF (heart failure with reduced ejection fraction) /--- systolic dysfunction CHF   Alcoholic cardiomyopathy (HCC)   Hypertension   CAD (coronary artery disease)   Aphasia   Acute CVA (cerebrovascular accident) (HCC)   Cocaine abuse (HCC)   Brief Summary:- - 59 y.o. year-old w/ hx of HTN, MI, CVA, cocaine, s/p prior inferior STEMI and severe 2V CAD w/ PCI to RCA 04/2016 in the setting of heavy on-going tobacco abuse, has residual LAD disease,  etoh cardiomyopathy in the setting of alcohol abuse, h/o Systolic dysfunction CHF with diffuse hypokinesis and history of noncompliance admitted on 03/06/2020 with acute left MCA territory stroke   A/p   1)Acute left MCA territory infarct--- MRI brain/MRA head with Acute anterior left MCA territory infarcts with associated severe stenosis of the superior left M2 MCA branch. -Remote infarct in left parietal lobe noted -Carotid artery Dopplers with complete occlusion on the right CCA with reconstitution of the carotid bulb via retrograde filling via the right ECA -Carotid artery Dopplers also showed  moderate to large amount of left-sided atherosclerotic plaque with markedly elevated peak systolic velocity within the left ICA compatible with a subtotal occlusion -Discussed with on-call vascular surgeon Dr. Tawanna Cooler Early -Recommend CTA head and CTA neck -Recommends transfer to [facility] for further vascular surgery evaluation -Recommends DAPT with aspirin and Plavix -Patient agreeable to this plan--please note the patient has a history of noncompliance and history of signing out AMA previously, but this time patient is agreeable to stay for treatment -A1c is 5.7 -LDL 80, HDL 36 -Continue Lipitor--despite the fact that patient lipid panel is close to desired limits, patient should still take Lipitor/Statin for it's Pleiotropic effects (beyond cholesterol lowering benefits) -PT and OT eval appreciated  2)History of heavy alcohol use--- patient drinks at least 6 Bud light beer Daily -Very high risk for DTs -Lorazepam per CIWA protocol, thiamine and folic acid as ordered  3) heavy tobacco abuse--- smokes about 2 packs/day, give nicotine patch  4)H/o NSTEMI and Severe 2V CAD w/ PCI to RCA in 04/2016 ---has residual LAD disease, --c/n Aspirin, Plavix, Lipitor and Coreg  5)H/o Systolic  dysfunction CHF --- echo showed EF of 40 to 45% with global/ diffuse hypokinesis--- suspect some component of ischemic cardiomyopathy compounded by alcoholic cardiomyopathy-- --patient was previously unable to tolerate ACEI/ARB/ARNI due to soft BP -Consider low-dose lisinopril upon discharge if BP tolerates -  Echo with EF of 40 to 45% with global hypokinesis -Continue Coreg  6) history of cocaine abuse and medication and lifestyle noncompliance--- patient declines outpatient counseling/treatments   Disposition/Need for in-Hospital Stay- patient unable to be discharged at this time due to --- transfer to [facility] for further vascular surgery evaluation--due to acute stroke with carotid artery  occlusion  Status is: Inpatient  Remains inpatient appropriate because:transfer to [facility] campus for further vascular surgery evaluation--due to acute stroke with carotid artery occlusion   Disposition: The patient is from: Home              Anticipated d/c is to: Home              Anticipated d/c date is: 1 day              Patient currently is not medically stable to d/c. Barriers: Not Clinically Stable-   Code Status :  -  Code Status: Full Code   Family Communication:    NA (patient is alert, awake and coherent)   Consults  : Neurology Dr. Gerilyn Pilgrim and vascular surgeon Dr. Tawanna Cooler Early  DVT Prophylaxis  :   - SCDs  enoxaparin (LOVENOX) injection 40 mg Start: 03/06/20 2200    Lab Results  Component Value Date   PLT 202 03/06/2020   PLT 193 03/06/2020    Inpatient Medications  Scheduled Meds: . aspirin  81 mg Oral Daily  . atorvastatin  40 mg Oral Daily  . carvedilol  3.125 mg Oral BID  . clopidogrel  75 mg Oral Daily  . enoxaparin (LOVENOX) injection  40 mg Subcutaneous Q24H  . folic acid  1 mg Oral Daily  . LORazepam  0-4 mg Intravenous Q6H   Followed by  . [START ON 03/09/2020] LORazepam  0-4 mg Intravenous Q12H  . multivitamin with minerals  1 tablet Oral Daily  . nicotine  21 mg Transdermal Daily  . thiamine  100 mg Oral Daily   Or  . thiamine  100 mg Intravenous Daily   Continuous Infusions: . sodium chloride 100 mL/hr (03/06/20 1837)   PRN Meds:.acetaminophen **OR** acetaminophen (TYLENOL) oral liquid 160 mg/5 mL **OR** acetaminophen, LORazepam **OR** LORazepam, senna-docusate    Anti-infectives (From admission, onward)   None        Objective:   Vitals:   03/07/20 0331 03/07/20 1000 03/07/20 1342 03/07/20 1400  BP: 108/64 120/77 (!) 155/92 (!) 156/84  Pulse: 80 84 74 73  Resp: Temp: 98.4 F (36.9 C) 98 F (36.7 C) 98.5 F (36.9 C) 98.4 F (36.9 C)  TempSrc: Oral Oral Oral Oral  SpO2: 95% 92% 98% 97%  Weight:        Wt  Readings from Last 3 Encounters:  03/06/20 63.5 kg  12/30/19 65.6 kg  12/28/19 66.2 kg     Intake/Output Summary (Last 24 hours) at 03/07/2020 1455 Last data filed at 03/07/2020 1000 Gross per 24 hour  Intake 500 ml  Output 500 ml  Net 0 ml    Physical Exam  Gen:- Awake Alert,  In no apparent distress  HEENT:- [place].AT, No sclera icterus, subtle facial droop Neck-Supple Neck,No JVD,.  Lungs-  CTAB , fair symmetrical air movement CV- S1, S2 normal, regular  Abd-  +ve B.Sounds, Abd Soft, No tenderness,    Extremity/Skin:- No  edema, pedal pulses present  Psych-affect is appropriate, oriented x3 Neuro-mild right-sided neuro deficits and speech deficits appears to be improving/resolving ,  no additional NEW focal deficits since admission   Data Review:   Micro Results Recent Results (from the past 240 hour(s))  Resp Panel by RT-PCR (Flu A&B, Covid) Nasopharyngeal Swab     Status: None   Collection Time: 03/06/20  5:16 PM   Specimen: Nasopharyngeal Swab; Nasopharyngeal(NP) swabs in vial transport medium  Result Value Ref Range Status   SARS Coronavirus 2 by RT PCR NEGATIVE NEGATIVE Final    Comment: (NOTE) SARS-CoV-2 target nucleic acids are NOT DETECTED.  The SARS-CoV-2 RNA is generally detectable in upper respiratory specimens during the acute phase of infection. The lowest concentration of SARS-CoV-2 viral copies this assay can detect is 138 copies/mL. A negative result does not preclude SARS-Cov-2 infection and should not be used as the sole basis for treatment or other patient management decisions. A negative result may occur with  improper specimen collection/handling, submission of specimen other than nasopharyngeal swab, presence of viral mutation(s) within the areas targeted by this assay, and inadequate number of viral copies(<138 copies/mL). A negative result must be combined with clinical observations, patient history, and epidemiological information. The expected  result is Negative.  Fact Sheet for Patients:  BloggerCourse.com  Fact Sheet for Healthcare Providers:  SeriousBroker.it  This test is no t yet approved or cleared by the [place] FDA and  has been authorized for detection and/or diagnosis of SARS-CoV-2 by FDA under an Emergency Use Authorization (EUA). This EUA will remain  in effect (meaning this test can be used) for the duration of the COVID-19 declaration under Section 564(b)(1) of the Act, 21 U.S.C.section 360bbb-3(b)(1), unless the authorization is terminated  or revoked sooner.       Influenza A by PCR NEGATIVE NEGATIVE Final   Influenza B by PCR NEGATIVE NEGATIVE Final    Comment: (NOTE) The Xpert Xpress SARS-CoV-2/FLU/RSV plus assay is intended as an aid in the diagnosis of influenza from Nasopharyngeal swab specimens and should not be used as a sole basis for treatment. Nasal washings and aspirates are unacceptable for Xpert Xpress SARS-CoV-2/FLU/RSV testing.  Fact Sheet for Patients: BloggerCourse.com  Fact Sheet for Healthcare Providers: SeriousBroker.it  This test is not yet approved or cleared by the [place] FDA and has been authorized for detection and/or diagnosis of SARS-CoV-2 by FDA under an Emergency Use Authorization (EUA). This EUA will remain in effect (meaning this test can be used) for the duration of the COVID-19 declaration under Section 564(b)(1) of the Act, 21 U.S.C. section 360bbb-3(b)(1), unless the authorization is terminated or revoked.  Performed at [facility], [address], [place], [place] [address]     Radiology Reports DG Chest 2 View  Result Date: 03/06/2020 CLINICAL DATA:  Initial evaluation for acute CVA. EXAM: CHEST - 2 VIEW COMPARISON:  Prior radiograph from 10/24/2019. FINDINGS: Cardiac and mediastinal silhouettes are within normal limits. Mild aortic  atherosclerosis. Lungs well inflated. No focal infiltrates. No edema or effusion. No pneumothorax. No acute osseous finding. IMPRESSION: 1. No active cardiopulmonary disease. 2.  Aortic Atherosclerosis (ICD10-I70.0). Electronically Signed   By: Rise Mu M.D.   On: 03/06/2020 21:22   CT Head Wo Contrast  Result Date: 03/06/2020 CLINICAL DATA:  Delirium. Additional history provided: Slow speech, chest pain. EXAM: CT HEAD WITHOUT CONTRAST TECHNIQUE: Contiguous axial images were obtained from the base of the skull through the vertex without intravenous contrast. COMPARISON:  Head CT 05/03/2011. FINDINGS: Brain: Mild cerebral and cerebellar atrophy. Infarction changes within the left insula and subinsular region, as well as  cortical/subcortical mid left frontal lobe/operculum, which may be subacute or chronic. These infarcts are overall small to moderate-sized. Chronic appearing cortically based infarcts more posteriorly within the left frontal and parietal lobes. There is no acute intracranial hemorrhage. No extra-axial fluid collection. No evidence of intracranial mass. No midline shift. Vascular: No hyperdense vessel.  Atherosclerotic calcifications. Skull: Normal. Negative for fracture or focal lesion. Sinuses/Orbits: Visualized orbits show no acute finding. No significant paranasal sinus disease at the imaged levels Other: 1.4 cm lipoma within the right temporoparietal scalp (series 2, image 19). IMPRESSION: Infarction changes within the left insula/subinsular region and adjacent left frontal lobe, which may be subacute or chronic. Consider brain MRI for further evaluation. Chronic appearing cortically based infarcts more posteriorly within the left frontal and parietal lobes. Mild parenchymal atrophy. Electronically Signed   By: Jackey LogeKyle  Golden DO   On: 03/06/2020 15:55   MR ANGIO HEAD WO CONTRAST  Addendum Date: 03/06/2020   ADDENDUM REPORT: 03/06/2020 17:13 ADDENDUM: Findings discussed with Dr.  Jeraldine LootsLockwood via telephone at 5:08 PM. Electronically Signed   By: Feliberto HartsFrederick S Jones MD   On: 03/06/2020 17:13   Result Date: 03/06/2020 CLINICAL DATA:  Neuro deficit, acute stroke suspected. EXAM: MRI HEAD WITHOUT CONTRAST MRA HEAD WITHOUT CONTRAST TECHNIQUE: Multiplanar, multiecho pulse sequences of the brain and surrounding structures were obtained without intravenous contrast. Angiographic images of the head were obtained using MRA technique without contrast. COMPARISON:  Same day CT head. FINDINGS: MRI HEAD FINDINGS Brain: Acute infarcts in the anterior left MCA territory, including the anterior insula and overlying frontal lobe cortex and subcortical white matter. Mild associated edema without substantial mass effect. There is asymmetric chronic microvascular ischemic change involving the left MCA territory with a remote infarct in the left parietal lobe. No acute hemorrhage. No hydrocephalus. No mass lesion. Skull and upper cervical spine: Normal marrow signal. Lipoma of the right scalp. Sinuses/Orbits: Mild mucosal thickening without air-fluid levels. Unremarkable orbits. MRA HEAD FINDINGS Anterior circulation: Bilateral internal carotid arteries are patent bilateral M1 MCAs are patent. There is focal severe stenosis of the left proximal superior M2 MCA branch, which supplies the region of acute infarct detailed above. The right MCA and bilateral ACA branches are patent without evidence of hemodynamically significant proximal stenosis. No aneurysm identified. Posterior circulation: Bilateral intradural vertebral arteries and basilar artery are patent. Bilateral posterior cerebral arteries are patent without evidence of hemodynamically significant proximal stenosis. IMPRESSION: 1. Acute anterior left MCA territory infarcts with associated severe stenosis of the superior left M2 MCA branch. 2. Asymmetric chronic microvascular ischemic change involving the left MCA territory with a remote infarct in the left  parietal lobe. These findings suggest an element of chronic ischemia/hypoperfusion to the left MCA territory. Electronically Signed: By: Feliberto HartsFrederick S Jones MD On: 03/06/2020 17:00   MR Brain Wo Contrast (neuro protocol)  Addendum Date: 03/06/2020   ADDENDUM REPORT: 03/06/2020 17:13 ADDENDUM: Findings discussed with Dr. Jeraldine LootsLockwood via telephone at 5:08 PM. Electronically Signed   By: Feliberto HartsFrederick S Jones MD   On: 03/06/2020 17:13   Result Date: 03/06/2020 CLINICAL DATA:  Neuro deficit, acute stroke suspected. EXAM: MRI HEAD WITHOUT CONTRAST MRA HEAD WITHOUT CONTRAST TECHNIQUE: Multiplanar, multiecho pulse sequences of the brain and surrounding structures were obtained without intravenous contrast. Angiographic images of the head were obtained using MRA technique without contrast. COMPARISON:  Same day CT head. FINDINGS: MRI HEAD FINDINGS Brain: Acute infarcts in the anterior left MCA territory, including the anterior insula and overlying frontal lobe cortex and subcortical  white matter. Mild associated edema without substantial mass effect. There is asymmetric chronic microvascular ischemic change involving the left MCA territory with a remote infarct in the left parietal lobe. No acute hemorrhage. No hydrocephalus. No mass lesion. Skull and upper cervical spine: Normal marrow signal. Lipoma of the right scalp. Sinuses/Orbits: Mild mucosal thickening without air-fluid levels. Unremarkable orbits. MRA HEAD FINDINGS Anterior circulation: Bilateral internal carotid arteries are patent bilateral M1 MCAs are patent. There is focal severe stenosis of the left proximal superior M2 MCA branch, which supplies the region of acute infarct detailed above. The right MCA and bilateral ACA branches are patent without evidence of hemodynamically significant proximal stenosis. No aneurysm identified. Posterior circulation: Bilateral intradural vertebral arteries and basilar artery are patent. Bilateral posterior cerebral arteries are  patent without evidence of hemodynamically significant proximal stenosis. IMPRESSION: 1. Acute anterior left MCA territory infarcts with associated severe stenosis of the superior left M2 MCA branch. 2. Asymmetric chronic microvascular ischemic change involving the left MCA territory with a remote infarct in the left parietal lobe. These findings suggest an element of chronic ischemia/hypoperfusion to the left MCA territory. Electronically Signed: By: Feliberto Harts MD On: 03/06/2020 17:00   US Carotid Bilateral (at [facility] and [facility] only)  Result Date: 03/07/2020 CLINICAL DATA:  CVA. History of CAD (post myocardial infarction) hypertension and smoking. History of alcohol and cocaine abuse. EXAM: BILATERAL CAROTID DUPLEX ULTRASOUND TECHNIQUE: Wallace Cullens scale imaging, color Doppler and duplex ultrasound were performed of bilateral carotid and vertebral arteries in the neck. COMPARISON:  MRA of the head-03/06/2020 FINDINGS: Criteria: Quantification of carotid stenosis is based on velocity parameters that correlate the residual internal carotid diameter with NASCET-based stenosis levels, using the diameter of the distal internal carotid lumen as the denominator for stenosis measurement. The following velocity measurements were obtained: RIGHT ICA: 82/39 cm/sec CCA: Occluded SYSTOLIC ICA/CCA RATIO:  N/A ECA: 100 cm/sec (retrograde flow demonstrated within the right external carotid artery) LEFT ICA: 494/161 cm/sec CCA: 70/25 cm/sec SYSTOLIC ICA/CCA RATIO:  6.0 ECA: 474 cm/sec RIGHT CAROTID ARTERY: The right common carotid artery is occluded throughout its imaged course (images 4, 8, 11 and 74). There is reconstitution of flow within the carotid bulb via retrograde flow within the right external carotid artery (images 22 and 23). There is a large amount of eccentric echogenic plaque involving the proximal and mid aspects of the right internal carotid artery (image 25). RIGHT VERTEBRAL ARTERY:  Antegrade flow LEFT CAROTID  ARTERY: There is a moderate amount of eccentric echogenic plaque within the left carotid bulb (image 49 and 51). There is a large amount of eccentric echogenic plaque involving the origin and proximal aspects of the left internal carotid artery (image 60, which results in markedly elevated peak systolic velocities within the proximal aspect of the left internal carotid artery and a sonographic string sign (image 62). Greatest acquired peak systolic velocity with the proximal left ICA measures 422 centimeters/second (image 63). LEFT VERTEBRAL ARTERY:  Antegrade flow IMPRESSION: 1. 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 2. Moderate to large amount of left-sided atherosclerotic plaque results in a sonographic string sign and markedly elevated peak systolic velocities within the left internal carotid artery compatible with a subtotal occlusion. Further evaluation with CTA as clinically indicated. 3. Antegrade flow demonstrated within the bilateral vertebral arteries. These results will be called to the ordering clinician or representative by the Radiologist Assistant, and  communication documented in the PACS or [employer]. Electronically Signed   By: Simonne Come M.D.   On: 03/07/2020 12:59   ECHOCARDIOGRAM COMPLETE  Result Date: 03/07/2020    ECHOCARDIOGRAM REPORT   Patient Name:   LEVIS NAZIR Date of Exam: 03/07/2020 Medical Rec #:  [id number]         Height:       71.5 in Accession #:    [id number]        Weight:       140.0 lb Date of Birth:  [birth year]         BSA:          1.821 m Patient Age:    59 years          BP:           120/77 mmHg Patient Gender: M                 HR:           84 bpm. [facility]am [facility] Procedure: 2D Echo Indications:    Stroke 434.91 / I163.9  History:        Patient has prior history of Echocardiogram examinations, most                 recent  11/19/2017. CAD and Previous Myocardial Infarction,                 Stroke; Risk Factors:Current Smoker and Hypertension. ETOH,                 ETOH.  Sonographer:    Jeryl Columbia RDCS (AE) Referring Phys: [id number] ROBERT SCHERTZ IMPRESSIONS  1. Left ventricular ejection fraction, by estimation, is 40 to 45%. The left ventricle has mildly decreased function. The left ventricle demonstrates global hypokinesis. Left ventricular diastolic parameters were normal.  2. Right ventricular systolic function is normal. The right ventricular size is normal.  3. The mitral valve is normal in structure. No evidence of mitral valve regurgitation. No evidence of mitral stenosis.  4. The aortic valve has an indeterminant number of cusps. Aortic valve regurgitation is not visualized. No aortic stenosis is present.  5. The inferior vena cava is normal in size with greater than 50% respiratory variability, suggesting right atrial pressure of 3 mmHg. FINDINGS  Left Ventricle: Left ventricular ejection fraction, by estimation, is 40 to 45%. The left ventricle has mildly decreased function. The left ventricle demonstrates global hypokinesis. The left ventricular internal cavity size was normal in size. There is  no left ventricular hypertrophy. Left ventricular diastolic parameters were normal. Right Ventricle: The right ventricular size is normal. No increase in right ventricular wall thickness. Right ventricular systolic function is normal. Left Atrium: Left atrial size was normal in size. Right Atrium: Right atrial size was normal in size. Pericardium: There is no evidence of pericardial effusion. Mitral Valve: The mitral valve is normal in structure. No evidence of mitral valve regurgitation. No evidence of mitral valve stenosis. Tricuspid Valve: The tricuspid valve is normal in structure. Tricuspid valve regurgitation is not demonstrated. No evidence of tricuspid stenosis. Aortic Valve: The aortic valve has an indeterminant number of  cusps. Aortic valve regurgitation is not visualized. No aortic stenosis is present. Aortic valve mean gradient measures 3.1 mmHg. Aortic valve peak gradient measures 5.3 mmHg. Aortic valve area, by VTI measures 2.31 cm. Pulmonic Valve: The pulmonic valve was not well visualized. Pulmonic valve regurgitation is not visualized. No evidence  of pulmonic stenosis. Aorta: The aortic root is normal in size and structure. Pulmonary Artery: Indeterminant PASP, inadequate TR jet. Venous: The inferior vena cava is normal in size with greater than 50% respiratory variability, suggesting right atrial pressure of 3 mmHg. IAS/Shunts: No atrial level shunt detected by color flow Doppler.  LEFT VENTRICLE PLAX 2D LVIDd:         5.46 cm  Diastology LVIDs:         4.50 cm  LV e' medial:    8.92 cm/s LV PW:         0.82 cm  LV E/e' medial:  7.2 LV IVS:        1.01 cm  LV e' lateral:   12.10 cm/s LVOT diam:     2.20 cm  LV E/e' lateral: 5.3 LV SV:         56 LV SV Index:   31 LVOT Area:     3.80 cm  RIGHT VENTRICLE RV S prime:     10.90 cm/s TAPSE (M-mode): 1.7 cm LEFT ATRIUM             Index       RIGHT ATRIUM          Index LA diam:        3.30 cm 1.81 cm/m  RA Area:     8.93 cm LA Vol (A2C):   30.4 ml 16.69 ml/m RA Volume:   19.00 ml 10.43 ml/m LA Vol (A4C):   23.6 ml 12.96 ml/m LA Biplane Vol: 26.8 ml 14.71 ml/m  AORTIC VALVE AV Area (Vmax):    2.51 cm AV Area (Vmean):   2.36 cm AV Area (VTI):     2.31 cm AV Vmax:           115.57 cm/s AV Vmean:          82.487 cm/s AV VTI:            0.244 m AV Peak Grad:      5.3 mmHg AV Mean Grad:      3.1 mmHg LVOT Vmax:         76.27 cm/s LVOT Vmean:        51.272 cm/s LVOT VTI:          0.148 m LVOT/AV VTI ratio: 0.61  AORTA Ao Root diam: 3.30 cm MITRAL VALVE MV Area (PHT): 3.68 cm    SHUNTS MV Decel Time: 206 msec    Systemic VTI:  0.15 m MV E velocity: 63.80 cm/s  Systemic Diam: 2.20 cm MV A velocity: 59.10 cm/s MV E/A ratio:  1.08 Dina Rich MD Electronically signed by  Dina Rich MD Signature Date/Time: 03/07/2020/12:56:34 PM    Final      CBC Recent Labs  Lab 03/06/20 1435  WBC 5.3  5.9  HGB 14.7  15.4  HCT 44.7  44.6  PLT 193  202  MCV 93.9  92.3  MCH 30.9  31.9  MCHC 32.9  34.5  RDW 13.8  13.6  LYMPHSABS 1.4  MONOABS 0.8  EOSABS 0.1  BASOSABS 0.0    Chemistries  Recent Labs  Lab 03/06/20 1435  NA 136  K 4.1  CL 101  CO2 25  GLUCOSE 121*  BUN 22  CREATININE 0.93  CALCIUM 9.5  AST 18  ALT 10  ALKPHOS 67  BILITOT 0.6   ------------------------------------------------------------------------------------------------------------------ Recent Labs    03/07/20 0526  CHOL 137  HDL 36*  LDLCALC 80  TRIG 408  CHOLHDL 3.8    Lab Results  Component Value Date   HGBA1C 5.7 (H) 03/07/2020   ------------------------------------------------------------------------------------------------------------------ No results for input(s): TSH, T4TOTAL, T3FREE, THYROIDAB in the last 72 hours.  Invalid input(s): FREET3 ------------------------------------------------------------------------------------------------------------------ No results for input(s): VITAMINB12, FOLATE, FERRITIN, TIBC, IRON, RETICCTPCT in the last 72 hours.  Coagulation profile Recent Labs  Lab 03/06/20 1630  INR 1.0    No results for input(s): DDIMER in the last 72 hours.  Cardiac Enzymes No results for input(s): CKMB, TROPONINI, MYOGLOBIN in the last 168 hours.  Invalid input(s): CK ------------------------------------------------------------------------------------------------------------------    Component Value Date/Time   BNP 16.0 07/22/2014 2300     Victoriana Aziz M.D on 03/07/2020 at 2:55 PM  Go to [email or web address] - for contact info  [facility] - Office  [phone]

## 2020-03-07 NOTE — Evaluation (Signed)
Speech Language Pathology Evaluation Patient Details Name: ESA RADEN MRN: [id number] DOB: 02/24/1961 Today's Date: 03/07/2020 Time: 4010-2725 SLP Time Calculation (min) (ACUTE ONLY): 32 min  Problem List:  Patient Active Problem List   Diagnosis Date Noted   Cocaine abuse (HCC) 03/07/2020   Chronic HFrEF (heart failure with reduced ejection fraction) /--- systolic dysfunction CHF 03/07/2020   Acute stroke due to occlusion of left middle cerebral artery (HCC) 03/06/2020   Aphasia 03/06/2020   Acute CVA (cerebrovascular accident) (HCC) 03/06/2020   Angina pectoris (HCC) 11/18/2017   Alcohol use disorder, moderate, dependence (HCC) 07/22/2017   Major depressive disorder, recurrent severe without psychotic features (HCC) 07/21/2017   CAD (coronary artery disease) 06/16/2017   Acute on chronic combined systolic and diastolic CHF (congestive heart failure) (HCC)    Hypertension    ST elevation myocardial infarction (STEMI) of inferior wall, initial episode of care [facility]): Type I MI 04/20/2016   Alcoholic cardiomyopathy (HCC) 36/64/4034   Chest pain 03/07/2013   Tobacco abuse 03/07/2013   ETOH abuse 03/07/2013   Unintentional weight loss 03/07/2013   Past Medical History:  Past Medical History:  Diagnosis Date   Alcohol abuse    CAD (coronary artery disease)    a. 2014 cath with non obst CAD, EF 35%.    b. 04/2016: inferior STEMI s/p DES to RCA. 70% LAD with no intervention.   CHF (congestive heart failure) (HCC)    Cocaine abuse (HCC)    History of noncompliance with medical treatment, presenting hazards to health    Hypertension    Myocardial infarction [facility])    Pneumothorax    Stroke [facility])    Past Surgical History:  Past Surgical History:  Procedure Laterality Date   ABDOMINAL SURGERY     CARDIAC CATHETERIZATION N/A 04/20/2016   Procedure: Left Heart Cath and Coronary Angiography;  Surgeon: Marykay Lex, MD;  Location: [facility] INVASIVE CV LAB;  Service: Cardiovascular;   Laterality: N/A;   CARDIAC CATHETERIZATION N/A 04/20/2016   Procedure: Coronary Stent Intervention;  Surgeon: Marykay Lex, MD;  Location: [facility] INVASIVE CV LAB;  Service: Cardiovascular;  Laterality: N/A;   COLON SURGERY     LEFT HEART CATHETERIZATION WITH CORONARY ANGIOGRAM N/A 03/08/2013   Procedure: LEFT HEART CATHETERIZATION WITH CORONARY ANGIOGRAM;  Surgeon: Micheline Chapman, MD;  Location: [facility] CATH LAB;  Service: Cardiovascular;  Laterality: N/A;   LIVER SURGERY     SPLENECTOMY     HPI:  59 y.o. year-old w/ hx of HTN, MI, CVA, cocaine, severe 2V CAD w/ PCI to RCA 2018,  etoh cardiomyopathy, tobacco abuse presented to ED at [facility] today w/ altered mental status and slurred speech, hasn't felt right since yesterday.  In ED pt was having sig difficulties w/ speaking. MRI shows: Acute anterior left MCA territory infarcts with associated severe stenosis of the superior left M2 MCA branch. Asymmetric chronic microvascular ischemic change involving the left MCA territory with a remote infarct in the left parietal lobe. These findings suggest an element of chronic ischemia/hypoperfusion to the left MCA territory. SLE requested.   Assessment / Plan / Recommendation Clinical Impression  Pt presents with mild to mild/mod expressive aphasia and mild dysarthria characterized by difficulty answering open ended questions, thought organization and planning, divergent naming, imprecise articulation negatively impacted by lack of dentition, and emotional lability. Pt tearful throughout the evaluation and frequently stated, "I don't know" and "I can't" even with automatic speech tasks (months of the year- "I couldn't do that before.").  He demonstrated increased difficulty with structured tasks/requests, however verbal fluency and accuracy of responses improved when targeted indirectly. Pt eventually able to verbalize that he has an 11th grade education, lives in a camper behind his place of employment, has a small dog  named Smoky, he doesn't drive, rides a Electrical engineer to [employer], and works on [employer]. He completed confrontation naming tasks with 100% accuracy, but then was unable to tell me the months of the year without moderate cues. Pt indicates that he feels like he could return to work after discharge. Recommend outpatient SLP f/u, however unsure if Pt will be able to get to the clinic. Pt does not have insurance. Will follow during acute stay to address expressive language deficits.     SLP Assessment  SLP Recommendation/Assessment: Patient needs continued Speech Lanaguage Pathology Services SLP Visit Diagnosis: Aphasia (R47.01)    Follow Up Recommendations  Outpatient SLP    Frequency and Duration min 2x/week  1 week      SLP Evaluation Cognition  Overall Cognitive Status: Within Functional Limits for tasks assessed Arousal/Alertness: Awake/alert Orientation Level: Oriented X4 Memory: Appears intact Awareness: Appears intact Problem Solving: Appears intact Behaviors:  (tearful) Safety/Judgment: Appears intact       Comprehension  Auditory Comprehension Overall Auditory Comprehension: Appears within functional limits for tasks assessed Yes/No Questions: Within Functional Limits Commands: Within Functional Limits Conversation: Simple Visual Recognition/Discrimination Discrimination: Within Function Limits Reading Comprehension Reading Status: Not tested    Expression Expression Primary Mode of Expression: Verbal Verbal Expression Overall Verbal Expression: Impaired Initiation: Impaired Automatic Speech: Name;Social Response;Day of week Level of Generative/Spontaneous Verbalization: Phrase Repetition: No impairment Naming: Impairment Responsive: 61-75% accurate Confrontation: Within functional limits Convergent: 75-100% accurate Divergent: 50-74% accurate Pragmatics: No impairment Interfering Components: Speech intelligibility (one tooth) Effective Techniques:  Sentence completion;Phonemic cues Non-Verbal Means of Communication: Not applicable Written Expression Dominant Hand: Right Written Expression: Not tested   Oral / Motor  Oral Motor/Sensory Function Overall Oral Motor/Sensory Function: Within functional limits Motor Speech Overall Motor Speech: Impaired Respiration: Within functional limits Phonation: Normal Resonance: Within functional limits Articulation: Impaired Level of Impairment: Phrase Intelligibility: Intelligibility reduced Word: 75-100% accurate Phrase: 50-74% accurate Sentence: 50-74% accurate Conversation: 50-74% accurate Motor Planning: Witnin functional limits Motor Speech Errors: Not applicable Interfering Components: Inadequate dentition Effective Techniques: Over-articulate;Increased vocal intensity;Slow rate   Thank you,  Havery Moros, CCC-SLP [phone]                     Anacristina Steffek 03/07/2020, 5:33 PM

## 2020-03-07 NOTE — Evaluation (Signed)
Occupational Therapy Evaluation Patient Details Name: Craig Perry MRN: [id number] DOB: 11-02-1960 Today's Date: 03/07/2020    History of Present Illness The patient is a 59 y.o. year-old w/ hx of HTN, MI, CVA, cocaine, severe 2V CAD w/ PCI to RCA 2018,  etoh cardiomyopathy, tobacco abuse presented to ED at [facility] today w/ altered mental status and slurred speech, hasn't felt right since yesterday.  In ED pt was having sig difficulties w/ speaking.  MRI done which showed concern for left anterior MCA stroke, acute.   Clinical Impression   Pt agreeable to OT evaluation, able to answer all questions and communicate appropriately however continues to endorse word finding difficulty. Pt performing bed mobility and functional mobility at supervision level while managing IV pole. Mod independent for ADL tasks. Pt with BUE strength 4/5, coordination and sensation are intact. No further OT services required at this time as pt is near his baseline.     Follow Up Recommendations  No OT follow up    Equipment Recommendations  None recommended by OT       Precautions / Restrictions Precautions Precautions: None Restrictions Weight Bearing Restrictions: No      Mobility Bed Mobility Overal bed mobility: Modified Independent                  Transfers Overall transfer level: Needs assistance Equipment used: None Transfers: Sit to/from Stand Sit to Stand: Supervision                  ADL either performed or assessed with clinical judgement   ADL Overall ADL's : Modified independent     Grooming: Wash/dry hands;Supervision/safety;Standing               Lower Body Dressing: Modified independent;Sitting/lateral leans   Toilet Transfer: Supervision/safety;Ambulation   Toileting- Clothing Manipulation and Hygiene: Modified independent;Sitting/lateral lean;Sit to/from stand       Functional mobility during ADLs: Supervision/safety       Vision Baseline  Vision/History: Wears glasses Wears Glasses: Reading only Patient Visual Report: No change from baseline Vision Assessment?: No apparent visual deficits            Pertinent Vitals/Pain Pain Assessment: No/denies pain     Hand Dominance Right   Extremity/Trunk Assessment Upper Extremity Assessment Upper Extremity Assessment: Overall WFL for tasks assessed (BUE strength 4/5)   Lower Extremity Assessment Lower Extremity Assessment: Defer to PT evaluation   Cervical / Trunk Assessment Cervical / Trunk Assessment: Normal   Communication Communication Communication: No difficulties   Cognition Arousal/Alertness: Awake/alert Behavior During Therapy: Flat affect Overall Cognitive Status: Within Functional Limits for tasks assessed                                                Home Living Family/patient expects to be discharged to:: Private residence Living Arrangements: Alone   Type of Home: Mobile home Home Access: Stairs to enter Secretary/administrator of Steps: 2 Entrance Stairs-Rails: None Home Layout: One level     Bathroom Shower/Tub: None   Firefighter: Standard     Home Equipment: None          Prior Functioning/Environment Level of Independence: Independent        Comments: independent in mobility, ADLs, works as a Careers adviser Problem List: Decreased activity  tolerance          AM-PAC OT "6 Clicks" Daily Activity     Outcome Measure Help from another person eating meals?: None Help from another person taking care of personal grooming?: None Help from another person toileting, which includes using toliet, bedpan, or urinal?: None Help from another person bathing (including washing, rinsing, drying)?: A Little Help from another person to put on and taking off regular upper body clothing?: None Help from another person to put on and taking off regular lower body clothing?: None 6 Click Score: 23   End of  Session    Activity Tolerance: Patient tolerated treatment well Patient left: in bed;with call bell/phone within reach  OT Visit Diagnosis: Muscle weakness (generalized) (M62.81)                Time: 0881-1031 OT Time Calculation (min): 11 min Charges:  OT General Charges $OT Visit: 1 Visit OT Evaluation $OT Eval Low Complexity: 1 Low   Ezra Sites, OTR/L  [phone] 03/07/2020, 8:07 AM

## 2020-03-07 NOTE — Evaluation (Addendum)
Physical Therapy Evaluation Patient Details Name: Craig Perry MRN: [id number] DOB: 19-May-1960 Today's Date: 03/07/2020   History of Present Illness  59 y.o. year-old w/ hx of HTN, MI, CVA, cocaine, severe 2V CAD w/ PCI to RCA 2018,  etoh cardiomyopathy, tobacco abuse presented to ED at [facility] today w/ altered mental status and slurred speech, hasn't felt right since yesterday.  In ED pt was having sig difficulties w/ speaking.  MRI done which showed concern for left anterior MCA stroke, acute.  Clinical Impression  Physical therapy evaluation completed, patient is at baseline and no further PT services recommended at this time. Pt with increased difficulty stating PLOF, able to cue pt with chart and pt verbalizes agreement possibly due to word finding difficulty. Pt becomes tearful when asked about home and during MMT and functional assessment, but therapist able to comfort and continue eval- RN notified. Pt not requiring any physical assistance while mobilizing around room, no unsteadiness noted, requiring slightly increased time to complete tasks. No acute PT needs recommended at this time.    Follow Up Recommendations No PT follow up    Equipment Recommendations  None recommended by PT    Recommendations for Other Services       Precautions / Restrictions Precautions Precautions: None Restrictions Weight Bearing Restrictions: No      Mobility  Bed Mobility Overal bed mobility: Modified Independent     Transfers Overall transfer level: Needs assistance Equipment used: None Transfers: Sit to/from Stand Sit to Stand: Modified independent (Device/Increase time)  General transfer comment: good steadniess, no UE assisting  Ambulation/Gait Ambulation/Gait assistance: Modified independent (Device/Increase time) Gait Distance (Feet): 30 Feet Assistive device: None Gait Pattern/deviations: WFL(Within Functional Limits)     General Gait Details: pt ambulates around room with  [facility] gait pattern, cadence slightly decreased due to small space, able to complete turns and navigate past furniture without LOB  Stairs            Wheelchair Mobility    Modified Rankin (Stroke Patients Only)       Balance Overall balance assessment: No apparent balance deficits (not formally assessed)                Pertinent Vitals/Pain Pain Assessment: No/denies pain    Home Living Family/patient expects to be discharged to:: Private residence Living Arrangements: Alone   Type of Home: Mobile home Home Access: Stairs to enter Entrance Stairs-Rails: None Entrance Stairs-Number of Steps: 2 Home Layout: One level Home Equipment: None      Prior Function Level of Independence: Independent         Comments: independent in mobility, ADLs, works as a Financial trader   Dominant Hand: Right    Extremity/Trunk Assessment   Upper Extremity Assessment Upper Extremity Assessment: Defer to OT evaluation    Lower Extremity Assessment Lower Extremity Assessment: Overall WFL for tasks assessed (AROM WNL, strength 4/5 throughout BLE, heel to shin test slightly decreased speed bil, denies numbness/tingling)    Cervical / Trunk Assessment Cervical / Trunk Assessment: Normal  Communication   Communication: No difficulties  Cognition Arousal/Alertness: Awake/alert Behavior During Therapy:  (varies between [facility] and tearful) Overall Cognitive Status: Within Functional Limits for tasks assessed    General Comments: Pt unable to state location, date, or situation, only states "yes" when asked these questions; pt states name appropriately. Pt appears upset when asked about home, initially reports "I can't find the words" then repeats "I can't say"  and becomes tearful when therapist asks pt what is wrong; pt again becomes tearful with MMT and functional mobility assessment but therapist able to comfort and reassure pt; pt shakes head and states "no" when  asked if he is scared, in danger, frustrated, upset- RN notified      General Comments      Exercises     Assessment/Plan    PT Assessment Patent does not need any further PT services  PT Problem List         PT Treatment Interventions      PT Goals (Current goals can be found in the Care Plan section)  Acute Rehab PT Goals Patient Stated Goal: none stated PT Goal Formulation: With patient Time For Goal Achievement: 03/07/20 Potential to Achieve Goals: Good    Frequency     Barriers to discharge        Co-evaluation               AM-PAC PT "6 Clicks" Mobility  Outcome Measure Help needed turning from your back to your side while in a flat bed without using bedrails?: None Help needed moving from lying on your back to sitting on the side of a flat bed without using bedrails?: None Help needed moving to and from a bed to a chair (including a wheelchair)?: None Help needed standing up from a chair using your arms (e.g., wheelchair or bedside chair)?: None Help needed to walk in hospital room?: None Help needed climbing 3-5 steps with a railing? : None 6 Click Score: 24    End of Session   Activity Tolerance: Patient tolerated treatment well Patient left: in bed;with call bell/phone within reach;Other (comment) (echo in room) Nurse Communication: Mobility status;Other (comment) (tearful mood) PT Visit Diagnosis: Other symptoms and signs involving the nervous system (W38.882)    Time: 8003-4917 PT Time Calculation (min) (ACUTE ONLY): 14 min   Charges:   PT Evaluation $PT Eval Low Complexity: 1 Low           Tori Aspyn Warnke PT, DPT 03/07/20, 10:49 AM [phone]

## 2020-03-08 DIAGNOSIS — I63512 Cerebral infarction due to unspecified occlusion or stenosis of left middle cerebral artery: Secondary | ICD-10-CM

## 2020-03-08 DIAGNOSIS — I2510 Atherosclerotic heart disease of native coronary artery without angina pectoris: Secondary | ICD-10-CM

## 2020-03-08 DIAGNOSIS — G464 Cerebellar stroke syndrome: Secondary | ICD-10-CM

## 2020-03-08 DIAGNOSIS — F1010 Alcohol abuse, uncomplicated: Secondary | ICD-10-CM

## 2020-03-08 DIAGNOSIS — I426 Alcoholic cardiomyopathy: Secondary | ICD-10-CM

## 2020-03-08 LAB — URINALYSIS, ROUTINE W REFLEX MICROSCOPIC
Bilirubin Urine: NEGATIVE
Glucose, UA: NEGATIVE mg/dL
Hgb urine dipstick: NEGATIVE
Ketones, ur: NEGATIVE mg/dL
Leukocytes,Ua: NEGATIVE
Nitrite: NEGATIVE
Protein, ur: NEGATIVE mg/dL
Specific Gravity, Urine: 1.024 (ref 1.005–1.030)
pH: 5 (ref 5.0–8.0)

## 2020-03-08 LAB — RAPID URINE DRUG SCREEN, HOSP PERFORMED
Amphetamines: NOT DETECTED
Barbiturates: NOT DETECTED
Benzodiazepines: POSITIVE — AB
Cocaine: POSITIVE — AB
Opiates: NOT DETECTED
Tetrahydrocannabinol: NOT DETECTED

## 2020-03-08 NOTE — Consult Note (Addendum)
[facility] NOTE   MRN : [id number]  Reason for Consult: Carotid stenosis symptomatic Referring Physician: [facility]  History of Present Illness: 59 y/o male with aphasia.  He presented to the [facility] with slurred speech and altered mental status.  MRI done which showed concern for left anterior MCA stroke, acute. ED d/w neurology Dr Gerilyn Pilgrim.  Speech has improved since arrival.    Carotid duplex showed occluded right CCA, and left ICA stenosis > 80 % with velocities > 494 cm/sec.      Medical history HTN, MI, CVA, cocaine, severe 2V CAD w/ PCI to RCA 2018,  etoh cardiomyopathy, tobacco abuse     Current Facility-Administered Medications  Medication Dose Route Frequency Provider Last Rate Last Admin  . 0.9 %  sodium chloride infusion  100 mL/hr Intravenous Continuous Gerhard Munch, MD 100 mL/hr at 03/08/20 0325 100 mL/hr at 03/08/20 0325  . acetaminophen (TYLENOL) tablet 650 mg  650 mg Oral Q4H PRN Delano Metz, MD       Or  . acetaminophen (TYLENOL) 160 MG/5ML solution 650 mg  650 mg Per Tube Q4H PRN Delano Metz, MD       Or  . acetaminophen (TYLENOL) suppository 650 mg  650 mg Rectal Q4H PRN Delano Metz, MD      . aspirin chewable tablet 81 mg  81 mg Oral Daily Delano Metz, MD   81 mg at 03/07/20 1002  . atorvastatin (LIPITOR) tablet 40 mg  40 mg Oral Daily Delano Metz, MD   40 mg at 03/07/20 1002  . carvedilol (COREG) tablet 3.125 mg  3.125 mg Oral BID Delano Metz, MD   3.125 mg at 03/07/20 2149  . clopidogrel (PLAVIX) tablet 75 mg  75 mg Oral Daily Delano Metz, MD   75 mg at 03/07/20 1002  . enoxaparin (LOVENOX) injection 40 mg  40 mg Subcutaneous Q24H Delano Metz, MD   40 mg at 03/07/20 2149  . folic acid (FOLVITE) tablet 1 mg  1 mg Oral Daily Emokpae, Courage, MD   1 mg at 03/07/20 1533  . LORazepam (ATIVAN) injection 0-4 mg  0-4 mg Intravenous Q6H Emokpae, Courage, MD   2 mg at 03/07/20 2030   Followed by  .  [START ON 03/09/2020] LORazepam (ATIVAN) injection 0-4 mg  0-4 mg Intravenous Q12H Emokpae, Courage, MD      . LORazepam (ATIVAN) tablet 1-4 mg  1-4 mg Oral Q1H PRN Shon Hale, MD       Or  . LORazepam (ATIVAN) injection 1-4 mg  1-4 mg Intravenous Q1H PRN Emokpae, Courage, MD      . multivitamin with minerals tablet 1 tablet  1 tablet Oral Daily Emokpae, Courage, MD   1 tablet at 03/07/20 1533  . nicotine (NICODERM CQ - dosed in mg/24 hours) patch 21 mg  21 mg Transdermal Daily Emokpae, Courage, MD   21 mg at 03/07/20 1533  . senna-docusate (Senokot-S) tablet 1 tablet  1 tablet Oral QHS PRN Delano Metz, MD      . thiamine tablet 100 mg  100 mg Oral Daily Emokpae, Courage, MD   100 mg at 03/07/20 1533   Or  . thiamine (B-1) injection 100 mg  100 mg Intravenous Daily Emokpae, Courage, MD        Pt meds include: Statin :Yes Betablocker: Yes ASA: Yes Other anticoagulants/antiplatelets: Plavix  Past Medical History:  Diagnosis Date  . Alcohol abuse   . CAD (coronary artery disease)  a. 2014 cath with non obst CAD, EF 35%.    b. 04/2016: inferior STEMI s/p DES to RCA. 70% LAD with no intervention.  . CHF (congestive heart failure) (HCC)   . Cocaine abuse (HCC)   . History of noncompliance with medical treatment, presenting hazards to health   . Hypertension   . Myocardial infarction (HCC)   . Pneumothorax   . Stroke [facility])     Past Surgical History:  Procedure Laterality Date  . ABDOMINAL SURGERY    . CARDIAC CATHETERIZATION N/A 04/20/2016   Procedure: Left Heart Cath and Coronary Angiography;  Surgeon: Marykay Lex, MD;  Location: [facility] INVASIVE CV LAB;  Service: Cardiovascular;  Laterality: N/A;  . CARDIAC CATHETERIZATION N/A 04/20/2016   Procedure: Coronary Stent Intervention;  Surgeon: Marykay Lex, MD;  Location: [facility] INVASIVE CV LAB;  Service: Cardiovascular;  Laterality: N/A;  . COLON SURGERY    . LEFT HEART CATHETERIZATION WITH CORONARY ANGIOGRAM N/A 03/08/2013    Procedure: LEFT HEART CATHETERIZATION WITH CORONARY ANGIOGRAM;  Surgeon: Micheline Chapman, MD;  Location: [facility] CATH LAB;  Service: Cardiovascular;  Laterality: N/A;  . LIVER SURGERY    . SPLENECTOMY      Social History Social History   Tobacco Use  . Smoking status: Current Every Day Smoker    Packs/day: 2.00    Years: 45.00    Pack years: 90.00    Types: Cigarettes  . Smokeless tobacco: Never Used  Vaping Use  . Vaping Use: Former  Substance Use Topics  . Alcohol use: Yes    Alcohol/week: 6.0 standard drinks    Types: 6 Cans of beer per week    Comment: 6 beers a day.  formerly 18/day  . Drug use: Yes    Types: Cocaine    Family History Family History  Problem Relation Age of Onset  . Heart attack Mother   . Diabetes Mother   . Stroke Mother   . Heart attack Father   . Diabetes Father     No Known Allergies   REVIEW OF SYSTEMS  General: [ ]  Weight loss, [ ]  Fever, [ ]  chills Neurologic: [ ]  Dizziness, [ ]  Blackouts, [ ]  Seizure [ ]  Stroke, [ ]  "Mini stroke", [x ] Slurred speech, [ ]  Temporary blindness; [ ]  weakness in arms or legs, [ ]  Hoarseness [ ]  Dysphagia Cardiac: [ ]  Chest pain/pressure, [ ]  Shortness of breath at rest [ ]  Shortness of breath with exertion, [ ]  Atrial fibrillation or irregular heartbeat  Vascular: [ ]  Pain in legs with walking, [ ]  Pain in legs at rest, [ ]  Pain in legs at night,  [ ]  Non-healing ulcer, [ ]  Blood clot in vein/DVT,   Pulmonary: [ ]  Home oxygen, [ ]  Productive cough, [ ]  Coughing up blood, [ ]  Asthma,  [ ]  Wheezing [ ]  COPD Musculoskeletal:  [ ]  Arthritis, [ ]  Low back pain, [ ]  Joint pain Hematologic: [ ]  Easy Bruising, [ ]  Anemia; [ ]  Hepatitis Gastrointestinal: [ ]  Blood in stool, [ ]  Gastroesophageal Reflux/heartburn, Urinary: [ ]  chronic Kidney disease, [ ]  on HD - [ ]  MWF or [ ]  TTHS, [ ]  Burning with urination, [ ]  Difficulty urinating Skin: [ ]  Rashes, [ ]  Wounds Psychological: [ ]  Anxiety, [ ]  Depression  Physical  Examination Vitals:   03/08/20 0130 03/08/20 0334 03/08/20 0600 03/08/20 0801  BP: (!) 125/55 118/90 135/89 118/88  Pulse: 94 95  93  Resp: 19 18  20  Temp:  97.7 F (36.5 C)  97.7 F (36.5 C)  TempSrc:  Oral  Oral  SpO2: 98%   95%  Weight:  65.4 kg     Body mass index is 19.83 kg/m.  General:  WDWN in NAD Gait: Normal HENT: WNL Eyes: Pupils equal Pulmonary: normal non-labored breathing , without Rales, rhonchi,  wheezing Cardiac: RRR, without  Murmurs, rubs or gallops; No carotid bruits Abdomen: soft, NT, no masses Skin: no rashes, ulcers noted;  no Gangrene , no cellulitis; no open wounds;   Vascular Exam/Pulses:radial, femoral pulses feet are warm to touch no evidence of wounds.     Musculoskeletal: no muscle wasting or atrophy; no edema  Neurologic: A&O X 3; Appropriate Affect ;  SENSATION: normal; MOTOR FUNCTION:  Symmetric moving all 4 ext. Speech is slurred slightly with some workds   Significant Diagnostic Studies: CBC Lab Results  Component Value Date   WBC 5.2 03/07/2020   HGB 14.2 03/07/2020   HCT 43.0 03/07/2020   MCV 94.1 03/07/2020   PLT 183 03/07/2020    BMET    Component Value Date/Time   NA 133 (L) 03/07/2020 2149   K 3.8 03/07/2020 2149   CL 101 03/07/2020 2149   CO2 24 03/07/2020 2149   GLUCOSE 92 03/07/2020 2149   BUN 16 03/07/2020 2149   CREATININE 0.72 03/07/2020 2149   CALCIUM 9.0 03/07/2020 2149   GFRNONAA >60 03/07/2020 2149   GFRAA >60 10/24/2019 1400   Estimated Creatinine Clearance: 89.7 mL/min (by C-G formula based on SCr of 0.72 mg/dL).  COAG Lab Results  Component Value Date   INR 1.0 03/06/2020   INR 1.11 04/20/2016   INR 0.94 03/08/2013     NRIGHT  ICA: 82/39 cm/sec  CCA: Occluded  SYSTOLIC ICA/CCA RATIO:  N/A  ECA: 100 cm/sec (retrograde flow demonstrated within the right external carotid artery)  LEFT  ICA: 494/161 cm/sec  CCA: 70/25 cm/sec  SYSTOLIC ICA/CCA RATIO:  6.0  ECA: 474  cm/sec  RIGHT CAROTID ARTERY: The right common carotid artery is occluded throughout its imaged course (images 4, 8, 11 and 74). There is reconstitution of flow within the carotid bulb via retrograde flow within the right external carotid artery (images 22 and 23). There is a large amount of eccentric echogenic plaque involving the proximal and mid aspects of the right internal carotid artery (image 25).  RIGHT VERTEBRAL ARTERY:  Antegrade flow  LEFT CAROTID ARTERY: There is a moderate amount of eccentric echogenic plaque within the left carotid bulb (image 49 and 51). There is a large amount of eccentric echogenic plaque involving the origin and proximal aspects of the left internal carotid artery (image 60, which results in markedly elevated peak systolic velocities within the proximal aspect of the left internal carotid artery and a sonographic string sign (image 62). Greatest acquired peak systolic velocity with the proximal left ICA measures 422 centimeters/second (image 63).  LEFT VERTEBRAL ARTERY:  Antegrade flow  IMPRESSION: 1. 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 2. Moderate to large amount of left-sided atherosclerotic plaque results in a sonographic string sign and markedly elevated peak systolic velocities within the left internal carotid artery compatible with a subtotal occlusion. Further evaluation with CTA as clinically indicated. 3. Antegrade flow demonstrated within the bilateral vertebral Arteries.   CTA neck IMPRESSION: 1. Occluded right CCA just beyond the origin with reconstituted right  carotid bifurcation and no significant downstream stenosis despite additional atherosclerosis.  2. Short segment Critical Stenosis at the Left ICA origin - short segment RADIOGRAPHIC STRING SIGN. Only mild left ICA siphon stenosis. No left MCA  occlusion or stenosis identified. Suspect the recent MRA appearance reflected the flow limiting stenosis in the neck.  3. Bilateral Vertebral Artery V2 atherosclerosis but no significant posterior circulation stenosis.  4. Stable CT appearance of acute and chronic left MCA territory infarcts. No new intracranial abnormality.  5. Aortic Atherosclerosis (ICD10-I70.0) and Emphysema (ICD10-J43.9).   ASSESSMENT/PLAN:  Acute symptomatic CVA left with slurred speech Critical left ICA stenosis  Plan for left CEA Friday 03/10/20 Pending further work by Neurology. On Statin, Plavix and ASA Etoh abuse -denies hx of DT's, observe for problems.  Mosetta Pigeonmma Maureen Collins 03/08/2020 8:11 AM   I have examined the patient, reviewed and agree with above.  Discussed plan with the patient and his daughter present.  Left MCA acute stroke with ICA string sign stenosis.  Patient also has right common carotid occlusion with retrograde flow in the external and antegrade flow in the internal carotid artery.  Feel that he is at very high risk for recurrent stroke if not corrected.  He is right-handed.  I discussed the technical aspects of the procedure and expected recovery and follow-up.  Also discussed slight risk of perioperative stroke.  Patient understands and agrees to proceed.  He understands that surgery is scheduled for Friday around noon and that he will be transferred to a stepdown unit following surgery.  Assuming no postoperative complications, could be discharged to home on Saturday if no new medical events arise  Gretta Beganodd Banner Huckaba, MD 03/08/2020 6:46 PM

## 2020-03-08 NOTE — H&P (View-Only) (Signed)
[facility] NOTE   MRN : [id number]  Reason for Consult: Carotid stenosis symptomatic Referring Physician: [facility]  History of Present Illness: 59 y/o male with aphasia.  He presented to the [facility] with slurred speech and altered mental status.  MRI done which showed concern for left anterior MCA stroke, acute. ED d/w neurology Dr Gerilyn Pilgrim.  Speech has improved since arrival.    Carotid duplex showed occluded right CCA, and left ICA stenosis > 80 % with velocities > 494 cm/sec.      Medical history HTN, MI, CVA, cocaine, severe 2V CAD w/ PCI to RCA 2018,  etoh cardiomyopathy, tobacco abuse     Current Facility-Administered Medications  Medication Dose Route Frequency Provider Last Rate Last Admin  . 0.9 %  sodium chloride infusion  100 mL/hr Intravenous Continuous Gerhard Munch, MD 100 mL/hr at 03/08/20 0325 100 mL/hr at 03/08/20 0325  . acetaminophen (TYLENOL) tablet 650 mg  650 mg Oral Q4H PRN Delano Metz, MD       Or  . acetaminophen (TYLENOL) 160 MG/5ML solution 650 mg  650 mg Per Tube Q4H PRN Delano Metz, MD       Or  . acetaminophen (TYLENOL) suppository 650 mg  650 mg Rectal Q4H PRN Delano Metz, MD      . aspirin chewable tablet 81 mg  81 mg Oral Daily Delano Metz, MD   81 mg at 03/07/20 1002  . atorvastatin (LIPITOR) tablet 40 mg  40 mg Oral Daily Delano Metz, MD   40 mg at 03/07/20 1002  . carvedilol (COREG) tablet 3.125 mg  3.125 mg Oral BID Delano Metz, MD   3.125 mg at 03/07/20 2149  . clopidogrel (PLAVIX) tablet 75 mg  75 mg Oral Daily Delano Metz, MD   75 mg at 03/07/20 1002  . enoxaparin (LOVENOX) injection 40 mg  40 mg Subcutaneous Q24H Delano Metz, MD   40 mg at 03/07/20 2149  . folic acid (FOLVITE) tablet 1 mg  1 mg Oral Daily Emokpae, Courage, MD   1 mg at 03/07/20 1533  . LORazepam (ATIVAN) injection 0-4 mg  0-4 mg Intravenous Q6H Emokpae, Courage, MD   2 mg at 03/07/20 2030   Followed by  .  [START ON 03/09/2020] LORazepam (ATIVAN) injection 0-4 mg  0-4 mg Intravenous Q12H Emokpae, Courage, MD      . LORazepam (ATIVAN) tablet 1-4 mg  1-4 mg Oral Q1H PRN Shon Hale, MD       Or  . LORazepam (ATIVAN) injection 1-4 mg  1-4 mg Intravenous Q1H PRN Emokpae, Courage, MD      . multivitamin with minerals tablet 1 tablet  1 tablet Oral Daily Emokpae, Courage, MD   1 tablet at 03/07/20 1533  . nicotine (NICODERM CQ - dosed in mg/24 hours) patch 21 mg  21 mg Transdermal Daily Emokpae, Courage, MD   21 mg at 03/07/20 1533  . senna-docusate (Senokot-S) tablet 1 tablet  1 tablet Oral QHS PRN Delano Metz, MD      . thiamine tablet 100 mg  100 mg Oral Daily Emokpae, Courage, MD   100 mg at 03/07/20 1533   Or  . thiamine (B-1) injection 100 mg  100 mg Intravenous Daily Emokpae, Courage, MD        Pt meds include: Statin :Yes Betablocker: Yes ASA: Yes Other anticoagulants/antiplatelets: Plavix  Past Medical History:  Diagnosis Date  . Alcohol abuse   . CAD (coronary artery disease)  a. 2014 cath with non obst CAD, EF 35%.    b. 04/2016: inferior STEMI s/p DES to RCA. 70% LAD with no intervention.  . CHF (congestive heart failure) (HCC)   . Cocaine abuse (HCC)   . History of noncompliance with medical treatment, presenting hazards to health   . Hypertension   . Myocardial infarction (HCC)   . Pneumothorax   . Stroke [facility])     Past Surgical History:  Procedure Laterality Date  . ABDOMINAL SURGERY    . CARDIAC CATHETERIZATION N/A 04/20/2016   Procedure: Left Heart Cath and Coronary Angiography;  Surgeon: Marykay Lex, MD;  Location: [facility] INVASIVE CV LAB;  Service: Cardiovascular;  Laterality: N/A;  . CARDIAC CATHETERIZATION N/A 04/20/2016   Procedure: Coronary Stent Intervention;  Surgeon: Marykay Lex, MD;  Location: [facility] INVASIVE CV LAB;  Service: Cardiovascular;  Laterality: N/A;  . COLON SURGERY    . LEFT HEART CATHETERIZATION WITH CORONARY ANGIOGRAM N/A 03/08/2013    Procedure: LEFT HEART CATHETERIZATION WITH CORONARY ANGIOGRAM;  Surgeon: Micheline Chapman, MD;  Location: [facility] CATH LAB;  Service: Cardiovascular;  Laterality: N/A;  . LIVER SURGERY    . SPLENECTOMY      Social History Social History   Tobacco Use  . Smoking status: Current Every Day Smoker    Packs/day: 2.00    Years: 45.00    Pack years: 90.00    Types: Cigarettes  . Smokeless tobacco: Never Used  Vaping Use  . Vaping Use: Former  Substance Use Topics  . Alcohol use: Yes    Alcohol/week: 6.0 standard drinks    Types: 6 Cans of beer per week    Comment: 6 beers a day.  formerly 18/day  . Drug use: Yes    Types: Cocaine    Family History Family History  Problem Relation Age of Onset  . Heart attack Mother   . Diabetes Mother   . Stroke Mother   . Heart attack Father   . Diabetes Father     No Known Allergies   REVIEW OF SYSTEMS  General: [ ]  Weight loss, [ ]  Fever, [ ]  chills Neurologic: [ ]  Dizziness, [ ]  Blackouts, [ ]  Seizure [ ]  Stroke, [ ]  "Mini stroke", [x ] Slurred speech, [ ]  Temporary blindness; [ ]  weakness in arms or legs, [ ]  Hoarseness [ ]  Dysphagia Cardiac: [ ]  Chest pain/pressure, [ ]  Shortness of breath at rest [ ]  Shortness of breath with exertion, [ ]  Atrial fibrillation or irregular heartbeat  Vascular: [ ]  Pain in legs with walking, [ ]  Pain in legs at rest, [ ]  Pain in legs at night,  [ ]  Non-healing ulcer, [ ]  Blood clot in vein/DVT,   Pulmonary: [ ]  Home oxygen, [ ]  Productive cough, [ ]  Coughing up blood, [ ]  Asthma,  [ ]  Wheezing [ ]  COPD Musculoskeletal:  [ ]  Arthritis, [ ]  Low back pain, [ ]  Joint pain Hematologic: [ ]  Easy Bruising, [ ]  Anemia; [ ]  Hepatitis Gastrointestinal: [ ]  Blood in stool, [ ]  Gastroesophageal Reflux/heartburn, Urinary: [ ]  chronic Kidney disease, [ ]  on HD - [ ]  MWF or [ ]  TTHS, [ ]  Burning with urination, [ ]  Difficulty urinating Skin: [ ]  Rashes, [ ]  Wounds Psychological: [ ]  Anxiety, [ ]  Depression  Physical  Examination Vitals:   03/08/20 0130 03/08/20 0334 03/08/20 0600 03/08/20 0801  BP: (!) 125/55 118/90 135/89 118/88  Pulse: 94 95  93  Resp: 19 18  20  Temp:  97.7 F (36.5 C)  97.7 F (36.5 C)  TempSrc:  Oral  Oral  SpO2: 98%   95%  Weight:  65.4 kg     Body mass index is 19.83 kg/m.  General:  WDWN in NAD Gait: Normal HENT: WNL Eyes: Pupils equal Pulmonary: normal non-labored breathing , without Rales, rhonchi,  wheezing Cardiac: RRR, without  Murmurs, rubs or gallops; No carotid bruits Abdomen: soft, NT, no masses Skin: no rashes, ulcers noted;  no Gangrene , no cellulitis; no open wounds;   Vascular Exam/Pulses:radial, femoral pulses feet are warm to touch no evidence of wounds.     Musculoskeletal: no muscle wasting or atrophy; no edema  Neurologic: A&O X 3; Appropriate Affect ;  SENSATION: normal; MOTOR FUNCTION:  Symmetric moving all 4 ext. Speech is slurred slightly with some workds   Significant Diagnostic Studies: CBC Lab Results  Component Value Date   WBC 5.2 03/07/2020   HGB 14.2 03/07/2020   HCT 43.0 03/07/2020   MCV 94.1 03/07/2020   PLT 183 03/07/2020    BMET    Component Value Date/Time   NA 133 (L) 03/07/2020 2149   K 3.8 03/07/2020 2149   CL 101 03/07/2020 2149   CO2 24 03/07/2020 2149   GLUCOSE 92 03/07/2020 2149   BUN 16 03/07/2020 2149   CREATININE 0.72 03/07/2020 2149   CALCIUM 9.0 03/07/2020 2149   GFRNONAA >60 03/07/2020 2149   GFRAA >60 10/24/2019 1400   Estimated Creatinine Clearance: 89.7 mL/min (by C-G formula based on SCr of 0.72 mg/dL).  COAG Lab Results  Component Value Date   INR 1.0 03/06/2020   INR 1.11 04/20/2016   INR 0.94 03/08/2013     NRIGHT  ICA: 82/39 cm/sec  CCA: Occluded  SYSTOLIC ICA/CCA RATIO:  N/A  ECA: 100 cm/sec (retrograde flow demonstrated within the right external carotid artery)  LEFT  ICA: 494/161 cm/sec  CCA: 70/25 cm/sec  SYSTOLIC ICA/CCA RATIO:  6.0  ECA: 474  cm/sec  RIGHT CAROTID ARTERY: The right common carotid artery is occluded throughout its imaged course (images 4, 8, 11 and 74). There is reconstitution of flow within the carotid bulb via retrograde flow within the right external carotid artery (images 22 and 23). There is a large amount of eccentric echogenic plaque involving the proximal and mid aspects of the right internal carotid artery (image 25).  RIGHT VERTEBRAL ARTERY:  Antegrade flow  LEFT CAROTID ARTERY: There is a moderate amount of eccentric echogenic plaque within the left carotid bulb (image 49 and 51). There is a large amount of eccentric echogenic plaque involving the origin and proximal aspects of the left internal carotid artery (image 60, which results in markedly elevated peak systolic velocities within the proximal aspect of the left internal carotid artery and a sonographic string sign (image 62). Greatest acquired peak systolic velocity with the proximal left ICA measures 422 centimeters/second (image 63).  LEFT VERTEBRAL ARTERY:  Antegrade flow  IMPRESSION: 1. 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 2. Moderate to large amount of left-sided atherosclerotic plaque results in a sonographic string sign and markedly elevated peak systolic velocities within the left internal carotid artery compatible with a subtotal occlusion. Further evaluation with CTA as clinically indicated. 3. Antegrade flow demonstrated within the bilateral vertebral Arteries.   CTA neck IMPRESSION: 1. Occluded right CCA just beyond the origin with reconstituted right  carotid bifurcation and no significant downstream stenosis despite additional atherosclerosis.  2. Short segment Critical Stenosis at the Left ICA origin - short segment RADIOGRAPHIC STRING SIGN. Only mild left ICA siphon stenosis. No left MCA  occlusion or stenosis identified. Suspect the recent MRA appearance reflected the flow limiting stenosis in the neck.  3. Bilateral Vertebral Artery V2 atherosclerosis but no significant posterior circulation stenosis.  4. Stable CT appearance of acute and chronic left MCA territory infarcts. No new intracranial abnormality.  5. Aortic Atherosclerosis (ICD10-I70.0) and Emphysema (ICD10-J43.9).   ASSESSMENT/PLAN:  Acute symptomatic CVA left with slurred speech Critical left ICA stenosis  Plan for left CEA Friday 03/10/20 Pending further work by Neurology. On Statin, Plavix and ASA Etoh abuse -denies hx of DT's, observe for problems.  Craig Perry 03/08/2020 8:11 AM   I have examined the patient, reviewed and agree with above.  Discussed plan with the patient and his daughter present.  Left MCA acute stroke with ICA string sign stenosis.  Patient also has right common carotid occlusion with retrograde flow in the external and antegrade flow in the internal carotid artery.  Feel that he is at very high risk for recurrent stroke if not corrected.  He is right-handed.  I discussed the technical aspects of the procedure and expected recovery and follow-up.  Also discussed slight risk of perioperative stroke.  Patient understands and agrees to proceed.  He understands that surgery is scheduled for Friday around noon and that he will be transferred to a stepdown unit following surgery.  Assuming no postoperative complications, could be discharged to home on Saturday if no new medical events arise  Latravious Levitt, MD 03/08/2020 6:46 PM  

## 2020-03-08 NOTE — Progress Notes (Signed)
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  He was found to have an acute left MCA infarct.  Patient was initially admitted to any pain hospital.  Work-up revealed critical stenosis in the left carotid artery.  He was subsequently transferred here for vascular input.  Reason for Visit: Acute stroke.  Consultants: Vascular surgery.  Neurology  Procedures: None yet  Antibiotics: Anti-infectives (From admission, onward)   None      Subjective/Interval History: Patient not very communicative but denies any headache vision problems.  Denies any weakness in any one side of his body.  No chest pain or shortness of breath currently.     Assessment/Plan:  Acute left MCA territory infarct/critical stenosis of left ICA Patient experienced slurred speech and altered mental status at presentation.  Imaging studies revealed left MCA infarct.  Patient was started on aspirin and Plavix.  Permissive hypertension.  LDL 80.  Continue statin.  HbA1c 5.7.   Urine drug screen positive for cocaine. PT and OT evaluation. CT angiogram showed critical stenosis in the left ICA.  Discussed with vascular surgery was consulted.  Plan is for CEA this admission. Echocardiogram shows EF of 40 to 45%. Neurology to follow.  History of coronary artery disease  Patient has history of NSTEMI previously.  He has two-vessel disease with PCI to RCA in 2018.  Currently seems to be stable from a cardiac standpoint.  Continue antiplatelet agents and statin.  Also on beta-blocker.  History of  cardiomyopathy thought to be secondary to alcohol Previously had a EF of 35% back in 2019.  Echocardiogram from this hospitalization shows EF of 40 to 45% with global diffuse hypokinesis.  Patient previously unable to take ACE inhibitor or ARB due to soft blood pressure.  Follow-up with cardiology in the outpatient setting.  History of cocaine abuse Urine drug screen positive for cocaine.  He will need extensive counseling.  History of tobacco abuse Nicotine patch.  History of alcohol abuse Drinks at least 5-6 beers on a daily basis.  High risk for alcohol withdrawal.  Continue to monitor closely.  CIWA protocol.  Thiamine and folic acid.   DVT Prophylaxis: Lovenox Code Status: Full code Family Communication: Discussed with the patient.  No family at bedside Disposition Plan: To be determined  Status is: Inpatient  Remains inpatient appropriate because:Ongoing diagnostic testing needed not appropriate for outpatient work up, IV treatments appropriate due to intensity of illness or inability to take PO and Inpatient level of care appropriate due to severity of illness   Dispo: The patient is from: Home              Anticipated d/c is to: Home              Anticipated d/c date is: 2 days              Patient currently is not medically stable to d/c.      Medications:  Scheduled: . aspirin  81 mg Oral Daily  . atorvastatin  40 mg Oral Daily  . carvedilol  3.125 mg Oral BID  . clopidogrel  75 mg Oral Daily  .  enoxaparin (LOVENOX) injection  40 mg Subcutaneous Q24H  . folic acid  1 mg Oral Daily  . LORazepam  0-4 mg Intravenous Q6H   Followed by  . [START ON 03/09/2020] LORazepam  0-4 mg Intravenous Q12H  . multivitamin with minerals  1 tablet Oral Daily  . nicotine  21 mg Transdermal Daily  . thiamine  100 mg Oral Daily   Or  . thiamine  100 mg Intravenous Daily   Continuous: . sodium chloride 100 mL/hr (03/08/20 0325)   RUE:[id number] **OR** acetaminophen (TYLENOL)  oral liquid 160 mg/5 mL **OR** acetaminophen, LORazepam **OR** LORazepam, senna-docusate   Objective:  Vital Signs  Vitals:   03/08/20 0130 03/08/20 0334 03/08/20 0600 03/08/20 0801  BP: (!) 125/55 118/90 135/89 118/88  Pulse: 94 95  93  Resp: 19 18  20   Temp:  97.7 F (36.5 C)  97.7 F (36.5 C)  TempSrc:  Oral  Oral  SpO2: 98%   95%  Weight:  65.4 kg      Intake/Output Summary (Last 24 hours) at 03/08/2020 0955 Last data filed at 03/07/2020 1855 Gross per 24 hour  Intake 2150.73 ml  Output 1000 ml  Net 1150.73 ml   Filed Weights   03/06/20 2306 03/08/20 0334  Weight: 63.5 kg 65.4 kg    General appearance: Awake alert.  In no distress Resp: Clear to auscultation bilaterally.  Normal effort Cardio: S1-S2 is normal regular.  No S3-S4.  Carotid bruit appreciated on the left GI: Abdomen is soft.  Nontender nondistended.  Bowel sounds are present normal.  No masses organomegaly Extremities: No edema.  Full range of motion of lower extremities. Neurologic: Alert and oriented x3.  No cranial nerve deficits appreciated.  No obvious focal strength deficits noted.  No pronator drift.   Lab Results:  Data Reviewed: I have personally reviewed following labs and imaging studies  CBC: Recent Labs  Lab 03/06/20 1435 03/07/20 2149  WBC 5.3  5.9 5.2  NEUTROABS 3.0  --   HGB 14.7  15.4 14.2  HCT 44.7  44.6 43.0  MCV 93.9  92.3 94.1  PLT 193  202 183    Basic Metabolic Panel: Recent Labs  Lab 03/06/20 1435 03/07/20 2149  NA 136 133*  K 4.1 3.8  CL 101 101  CO2 25 24  GLUCOSE 121* 92  BUN 22 16  CREATININE 0.93 0.72  CALCIUM 9.5 9.0  MG  --  2.3  PHOS  --  2.6    GFR: Estimated Creatinine Clearance: 89.7 mL/min (by C-G formula based on SCr of 0.72 mg/dL).  Liver Function Tests: Recent Labs  Lab 03/06/20 1435 03/07/20 2149  AST 18 15  ALT 10 9  ALKPHOS 67 62  BILITOT 0.6 0.6  PROT 7.6 6.8  ALBUMIN 4.2 3.8     Coagulation Profile: Recent Labs   Lab 03/06/20 1630  INR 1.0    HbA1C: Recent Labs    03/07/20 0526  HGBA1C 5.7*    CBG: Recent Labs  Lab 03/06/20 1419  GLUCAP 123*    Lipid Profile: Recent Labs    03/07/20 0526  CHOL 137  HDL 36*  LDLCALC 80  TRIG 295  CHOLHDL 3.8     Recent Results (from the past 240 hour(s))  Resp Panel by RT-PCR (Flu A&B, Covid) Nasopharyngeal Swab     Status: None   Collection Time: 03/06/20  5:16 PM   Specimen: Nasopharyngeal Swab; Nasopharyngeal(NP) swabs in vial transport medium  Result Value Ref  Range Status   SARS Coronavirus 2 by RT PCR NEGATIVE NEGATIVE Final    Comment: (NOTE) SARS-CoV-2 target nucleic acids are NOT DETECTED.  The SARS-CoV-2 RNA is generally detectable in upper respiratory specimens during the acute phase of infection. The lowest concentration of SARS-CoV-2 viral copies this assay can detect is 138 copies/mL. A negative result does not preclude SARS-Cov-2 infection and should not be used as the sole basis for treatment or other patient management decisions. A negative result may occur with  improper specimen collection/handling, submission of specimen other than nasopharyngeal swab, presence of viral mutation(s) within the areas targeted by this assay, and inadequate number of viral copies(<138 copies/mL). A negative result must be combined with clinical observations, patient history, and epidemiological information. The expected result is Negative.  Fact Sheet for Patients:  BloggerCourse.com  Fact Sheet for Healthcare Providers:  SeriousBroker.it  This test is no t yet approved or cleared by the [place] FDA and  has been authorized for detection and/or diagnosis of SARS-CoV-2 by FDA under an Emergency Use Authorization (EUA). This EUA will remain  in effect (meaning this test can be used) for the duration of the COVID-19 declaration under Section 564(b)(1) of the Act,  21 U.S.C.section 360bbb-3(b)(1), unless the authorization is terminated  or revoked sooner.       Influenza A by PCR NEGATIVE NEGATIVE Final   Influenza B by PCR NEGATIVE NEGATIVE Final    Comment: (NOTE) The Xpert Xpress SARS-CoV-2/FLU/RSV plus assay is intended as an aid in the diagnosis of influenza from Nasopharyngeal swab specimens and should not be used as a sole basis for treatment. Nasal washings and aspirates are unacceptable for Xpert Xpress SARS-CoV-2/FLU/RSV testing.  Fact Sheet for Patients: BloggerCourse.com  Fact Sheet for Healthcare Providers: SeriousBroker.it  This test is not yet approved or cleared by the [place] FDA and has been authorized for detection and/or diagnosis of SARS-CoV-2 by FDA under an Emergency Use Authorization (EUA). This EUA will remain in effect (meaning this test can be used) for the duration of the COVID-19 declaration under Section 564(b)(1) of the Act, 21 U.S.C. section 360bbb-3(b)(1), unless the authorization is terminated or revoked.  Performed at [facility], [address] [place], [place] [address]       Radiology Studies: CT ANGIO HEAD W OR WO CONTRAST  Result Date: 03/07/2020 CLINICAL DATA:  59 year old male with right CCA occlusion and high-grade left ICA stenosis on Doppler ultrasound yesterday. Small left MCA territory infarct on MRI yesterday. Severe left MCA M2 stenosis. EXAM: CT ANGIOGRAPHY HEAD AND NECK TECHNIQUE: Multidetector CT imaging of the head and neck was performed using the standard protocol during bolus administration of intravenous contrast. Multiplanar CT image reconstructions and MIPs were obtained to evaluate the vascular anatomy. Carotid stenosis measurements (when applicable) are obtained utilizing NASCET criteria, using the distal internal carotid diameter as the denominator. CONTRAST:  75mL OMNIPAQUE IOHEXOL 350 MG/ML SOLN COMPARISON:  Brain MRI and  intracranial MRA yesterday. Carotid Doppler ultrasound yesterday. Head CT 03/06/2020. FINDINGS: CT HEAD Brain: Calcified atherosclerosis at the skull base. No significant change in acute on chronic left MCA territory infarcts with cytotoxic edema centered at the insula, superior left perirolandic encephalomalacia. No acute intracranial hemorrhage identified. No midline shift, mass effect, or evidence of intracranial mass lesion. No ventriculomegaly. Stable gray-white matter differentiation elsewhere. Calvarium and skull base: Negative. Paranasal sinuses: Visualized paranasal sinuses and mastoids are stable and well pneumatized. Orbits: Small right lateral scalp lipoma is stable. Other visualized orbits  and scalp soft tissues are within normal limits. CTA NECK Skeleton: Largely absent dentition. Cervical spine degeneration. No acute osseous abnormality identified. Upper chest: Emphysema. Biapical lung scarring. No superior mediastinal lymphadenopathy. Other neck: Negative. Aortic arch: 3 vessel arch configuration with moderate soft and calcified arch atherosclerosis. Right carotid system: Brachiocephalic artery soft plaque without stenosis. Occluded right CCA just beyond the origin (series 8, image 118). Reconstituted right carotid bifurcation. Superimposed calcified plaque at the right ICA origin and bulb with less than 50 % stenosis with respect to the distal vessel. Left carotid system: Left CCA origin soft and calcified plaque without stenosis. Additional soft plaque proximal to the bifurcation without stenosis. Short segment critical stenosis at the left ICA origin (series 9, image 134), short segment radiographic string sign. Additional plaque in the bulb but no other significant stenosis to the skull base. Vertebral arteries: Mild proximal right subclavian artery plaque without stenosis. No right vertebral artery origin plaque or stenosis. Right V2 calcified plaque and tortuosity without significant stenosis.  Moderate proximal left subclavian artery plaque with less than 50% stenosis. Mild plaque at the left vertebral artery origin without stenosis. Tortuous left V1 segment. Codominant left vertebral with occasional plaque and tortuosity in the V2 segment but no significant stenosis to the skull base. CTA HEAD Posterior circulation: Codominant V4 segments are patent to the basilar without plaque or stenosis. Normal PICA origins. Patent basilar artery without stenosis. Normal SCA and PCA origins. Small bilateral posterior communicating arteries. Tortuous left P1 segment. Bilateral PCA branches are within normal limits. Anterior circulation: Both ICA siphons are patent. Soft and calcified plaque on the left results in mild distal petrous and supraclinoid stenosis. Mostly calcified plaque on the right with mild if any siphon stenosis. Patent carotid termini. Normal MCA and ACA origins. Anterior communicating artery and bilateral ACA branches are within normal limits. Right MCA M1 segment and bifurcation are patent without stenosis. Right MCA branches are within normal limits. Left MCA M1 segment and trifurcation are patent without stenosis. No left MCA branch occlusion is identified. Only mild left MCA branch irregularity noted. Venous sinuses: Patent. Anatomic variants: None. Review of the MIP images confirms the above findings IMPRESSION: 1. Occluded right CCA just beyond the origin with reconstituted right carotid bifurcation and no significant downstream stenosis despite additional atherosclerosis. 2. Short segment Critical Stenosis at the Left ICA origin - short segment RADIOGRAPHIC STRING SIGN. Only mild left ICA siphon stenosis. No left MCA occlusion or stenosis identified. Suspect the recent MRA appearance reflected the flow limiting stenosis in the neck. 3. Bilateral Vertebral Artery V2 atherosclerosis but no significant posterior circulation stenosis. 4. Stable CT appearance of acute and chronic left MCA territory  infarcts. No new intracranial abnormality. 5. Aortic Atherosclerosis (ICD10-I70.0) and Emphysema (ICD10-J43.9). Electronically Signed   By: Odessa Fleming M.D.   On: 03/07/2020 18:52   DG Chest 2 View  Result Date: 03/06/2020 CLINICAL DATA:  Initial evaluation for acute CVA. EXAM: CHEST - 2 VIEW COMPARISON:  Prior radiograph from 10/24/2019. FINDINGS: Cardiac and mediastinal silhouettes are within normal limits. Mild aortic atherosclerosis. Lungs well inflated. No focal infiltrates. No edema or effusion. No pneumothorax. No acute osseous finding. IMPRESSION: 1. No active cardiopulmonary disease. 2.  Aortic Atherosclerosis (ICD10-I70.0). Electronically Signed   By: Rise Mu M.D.   On: 03/06/2020 21:22   CT Head Wo Contrast  Result Date: 03/06/2020 CLINICAL DATA:  Delirium. Additional history provided: Slow speech, chest pain. EXAM: CT HEAD WITHOUT CONTRAST TECHNIQUE: Contiguous axial images  were obtained from the base of the skull through the vertex without intravenous contrast. COMPARISON:  Head CT 05/03/2011. FINDINGS: Brain: Mild cerebral and cerebellar atrophy. Infarction changes within the left insula and subinsular region, as well as cortical/subcortical mid left frontal lobe/operculum, which may be subacute or chronic. These infarcts are overall small to moderate-sized. Chronic appearing cortically based infarcts more posteriorly within the left frontal and parietal lobes. There is no acute intracranial hemorrhage. No extra-axial fluid collection. No evidence of intracranial mass. No midline shift. Vascular: No hyperdense vessel.  Atherosclerotic calcifications. Skull: Normal. Negative for fracture or focal lesion. Sinuses/Orbits: Visualized orbits show no acute finding. No significant paranasal sinus disease at the imaged levels Other: 1.4 cm lipoma within the right temporoparietal scalp (series 2, image 19). IMPRESSION: Infarction changes within the left insula/subinsular region and adjacent  left frontal lobe, which may be subacute or chronic. Consider brain MRI for further evaluation. Chronic appearing cortically based infarcts more posteriorly within the left frontal and parietal lobes. Mild parenchymal atrophy. Electronically Signed   By: Jackey Loge DO   On: 03/06/2020 15:55   CT ANGIO NECK W OR WO CONTRAST  Result Date: 03/07/2020 CLINICAL DATA:  59 year old male with right CCA occlusion and high-grade left ICA stenosis on Doppler ultrasound yesterday. Small left MCA territory infarct on MRI yesterday. Severe left MCA M2 stenosis. EXAM: CT ANGIOGRAPHY HEAD AND NECK TECHNIQUE: Multidetector CT imaging of the head and neck was performed using the standard protocol during bolus administration of intravenous contrast. Multiplanar CT image reconstructions and MIPs were obtained to evaluate the vascular anatomy. Carotid stenosis measurements (when applicable) are obtained utilizing NASCET criteria, using the distal internal carotid diameter as the denominator. CONTRAST:  40mL OMNIPAQUE IOHEXOL 350 MG/ML SOLN COMPARISON:  Brain MRI and intracranial MRA yesterday. Carotid Doppler ultrasound yesterday. Head CT 03/06/2020. FINDINGS: CT HEAD Brain: Calcified atherosclerosis at the skull base. No significant change in acute on chronic left MCA territory infarcts with cytotoxic edema centered at the insula, superior left perirolandic encephalomalacia. No acute intracranial hemorrhage identified. No midline shift, mass effect, or evidence of intracranial mass lesion. No ventriculomegaly. Stable gray-white matter differentiation elsewhere. Calvarium and skull base: Negative. Paranasal sinuses: Visualized paranasal sinuses and mastoids are stable and well pneumatized. Orbits: Small right lateral scalp lipoma is stable. Other visualized orbits and scalp soft tissues are within normal limits. CTA NECK Skeleton: Largely absent dentition. Cervical spine degeneration. No acute osseous abnormality identified. Upper  chest: Emphysema. Biapical lung scarring. No superior mediastinal lymphadenopathy. Other neck: Negative. Aortic arch: 3 vessel arch configuration with moderate soft and calcified arch atherosclerosis. Right carotid system: Brachiocephalic artery soft plaque without stenosis. Occluded right CCA just beyond the origin (series 8, image 118). Reconstituted right carotid bifurcation. Superimposed calcified plaque at the right ICA origin and bulb with less than 50 % stenosis with respect to the distal vessel. Left carotid system: Left CCA origin soft and calcified plaque without stenosis. Additional soft plaque proximal to the bifurcation without stenosis. Short segment critical stenosis at the left ICA origin (series 9, image 134), short segment radiographic string sign. Additional plaque in the bulb but no other significant stenosis to the skull base. Vertebral arteries: Mild proximal right subclavian artery plaque without stenosis. No right vertebral artery origin plaque or stenosis. Right V2 calcified plaque and tortuosity without significant stenosis. Moderate proximal left subclavian artery plaque with less than 50% stenosis. Mild plaque at the left vertebral artery origin without stenosis. Tortuous left V1 segment. Codominant left  vertebral with occasional plaque and tortuosity in the V2 segment but no significant stenosis to the skull base. CTA HEAD Posterior circulation: Codominant V4 segments are patent to the basilar without plaque or stenosis. Normal PICA origins. Patent basilar artery without stenosis. Normal SCA and PCA origins. Small bilateral posterior communicating arteries. Tortuous left P1 segment. Bilateral PCA branches are within normal limits. Anterior circulation: Both ICA siphons are patent. Soft and calcified plaque on the left results in mild distal petrous and supraclinoid stenosis. Mostly calcified plaque on the right with mild if any siphon stenosis. Patent carotid termini. Normal MCA and ACA  origins. Anterior communicating artery and bilateral ACA branches are within normal limits. Right MCA M1 segment and bifurcation are patent without stenosis. Right MCA branches are within normal limits. Left MCA M1 segment and trifurcation are patent without stenosis. No left MCA branch occlusion is identified. Only mild left MCA branch irregularity noted. Venous sinuses: Patent. Anatomic variants: None. Review of the MIP images confirms the above findings IMPRESSION: 1. Occluded right CCA just beyond the origin with reconstituted right carotid bifurcation and no significant downstream stenosis despite additional atherosclerosis. 2. Short segment Critical Stenosis at the Left ICA origin - short segment RADIOGRAPHIC STRING SIGN. Only mild left ICA siphon stenosis. No left MCA occlusion or stenosis identified. Suspect the recent MRA appearance reflected the flow limiting stenosis in the neck. 3. Bilateral Vertebral Artery V2 atherosclerosis but no significant posterior circulation stenosis. 4. Stable CT appearance of acute and chronic left MCA territory infarcts. No new intracranial abnormality. 5. Aortic Atherosclerosis (ICD10-I70.0) and Emphysema (ICD10-J43.9). Electronically Signed   By: Odessa Fleming M.D.   On: 03/07/2020 18:52   MR ANGIO HEAD WO CONTRAST  Addendum Date: 03/06/2020   ADDENDUM REPORT: 03/06/2020 17:13 ADDENDUM: Findings discussed with Dr. Jeraldine Loots via telephone at 5:08 PM. Electronically Signed   By: Feliberto Harts MD   On: 03/06/2020 17:13   Result Date: 03/06/2020 CLINICAL DATA:  Neuro deficit, acute stroke suspected. EXAM: MRI HEAD WITHOUT CONTRAST MRA HEAD WITHOUT CONTRAST TECHNIQUE: Multiplanar, multiecho pulse sequences of the brain and surrounding structures were obtained without intravenous contrast. Angiographic images of the head were obtained using MRA technique without contrast. COMPARISON:  Same day CT head. FINDINGS: MRI HEAD FINDINGS Brain: Acute infarcts in the anterior left MCA  territory, including the anterior insula and overlying frontal lobe cortex and subcortical white matter. Mild associated edema without substantial mass effect. There is asymmetric chronic microvascular ischemic change involving the left MCA territory with a remote infarct in the left parietal lobe. No acute hemorrhage. No hydrocephalus. No mass lesion. Skull and upper cervical spine: Normal marrow signal. Lipoma of the right scalp. Sinuses/Orbits: Mild mucosal thickening without air-fluid levels. Unremarkable orbits. MRA HEAD FINDINGS Anterior circulation: Bilateral internal carotid arteries are patent bilateral M1 MCAs are patent. There is focal severe stenosis of the left proximal superior M2 MCA branch, which supplies the region of acute infarct detailed above. The right MCA and bilateral ACA branches are patent without evidence of hemodynamically significant proximal stenosis. No aneurysm identified. Posterior circulation: Bilateral intradural vertebral arteries and basilar artery are patent. Bilateral posterior cerebral arteries are patent without evidence of hemodynamically significant proximal stenosis. IMPRESSION: 1. Acute anterior left MCA territory infarcts with associated severe stenosis of the superior left M2 MCA branch. 2. Asymmetric chronic microvascular ischemic change involving the left MCA territory with a remote infarct in the left parietal lobe. These findings suggest an element of chronic ischemia/hypoperfusion to the left  MCA territory. Electronically Signed: By: Feliberto Harts MD On: 03/06/2020 17:00   MR Brain Wo Contrast (neuro protocol)  Addendum Date: 03/06/2020   ADDENDUM REPORT: 03/06/2020 17:13 ADDENDUM: Findings discussed with Dr. Jeraldine Loots via telephone at 5:08 PM. Electronically Signed   By: Feliberto Harts MD   On: 03/06/2020 17:13   Result Date: 03/06/2020 CLINICAL DATA:  Neuro deficit, acute stroke suspected. EXAM: MRI HEAD WITHOUT CONTRAST MRA HEAD WITHOUT CONTRAST  TECHNIQUE: Multiplanar, multiecho pulse sequences of the brain and surrounding structures were obtained without intravenous contrast. Angiographic images of the head were obtained using MRA technique without contrast. COMPARISON:  Same day CT head. FINDINGS: MRI HEAD FINDINGS Brain: Acute infarcts in the anterior left MCA territory, including the anterior insula and overlying frontal lobe cortex and subcortical white matter. Mild associated edema without substantial mass effect. There is asymmetric chronic microvascular ischemic change involving the left MCA territory with a remote infarct in the left parietal lobe. No acute hemorrhage. No hydrocephalus. No mass lesion. Skull and upper cervical spine: Normal marrow signal. Lipoma of the right scalp. Sinuses/Orbits: Mild mucosal thickening without air-fluid levels. Unremarkable orbits. MRA HEAD FINDINGS Anterior circulation: Bilateral internal carotid arteries are patent bilateral M1 MCAs are patent. There is focal severe stenosis of the left proximal superior M2 MCA branch, which supplies the region of acute infarct detailed above. The right MCA and bilateral ACA branches are patent without evidence of hemodynamically significant proximal stenosis. No aneurysm identified. Posterior circulation: Bilateral intradural vertebral arteries and basilar artery are patent. Bilateral posterior cerebral arteries are patent without evidence of hemodynamically significant proximal stenosis. IMPRESSION: 1. Acute anterior left MCA territory infarcts with associated severe stenosis of the superior left M2 MCA branch. 2. Asymmetric chronic microvascular ischemic change involving the left MCA territory with a remote infarct in the left parietal lobe. These findings suggest an element of chronic ischemia/hypoperfusion to the left MCA territory. Electronically Signed: By: Feliberto Harts MD On: 03/06/2020 17:00   US Carotid Bilateral (at [facility] and [facility] only)  Result Date:  03/07/2020 CLINICAL DATA:  CVA. History of CAD (post myocardial infarction) hypertension and smoking. History of alcohol and cocaine abuse. EXAM: BILATERAL CAROTID DUPLEX ULTRASOUND TECHNIQUE: Wallace Cullens scale imaging, color Doppler and duplex ultrasound were performed of bilateral carotid and vertebral arteries in the neck. COMPARISON:  MRA of the head-03/06/2020 FINDINGS: Criteria: Quantification of carotid stenosis is based on velocity parameters that correlate the residual internal carotid diameter with NASCET-based stenosis levels, using the diameter of the distal internal carotid lumen as the denominator for stenosis measurement. The following velocity measurements were obtained: RIGHT ICA: 82/39 cm/sec CCA: Occluded SYSTOLIC ICA/CCA RATIO:  N/A ECA: 100 cm/sec (retrograde flow demonstrated within the right external carotid artery) LEFT ICA: 494/161 cm/sec CCA: 70/25 cm/sec SYSTOLIC ICA/CCA RATIO:  6.0 ECA: 474 cm/sec RIGHT CAROTID ARTERY: The right common carotid artery is occluded throughout its imaged course (images 4, 8, 11 and 74). There is reconstitution of flow within the carotid bulb via retrograde flow within the right external carotid artery (images 22 and 23). There is a large amount of eccentric echogenic plaque involving the proximal and mid aspects of the right internal carotid artery (image 25). RIGHT VERTEBRAL ARTERY:  Antegrade flow LEFT CAROTID ARTERY: There is a moderate amount of eccentric echogenic plaque within the left carotid bulb (image 49 and 51). There is a large amount of eccentric echogenic plaque involving the origin and proximal aspects of the left internal carotid artery (image 60,  which results in markedly elevated peak systolic velocities within the proximal aspect of the left internal carotid artery and a sonographic string sign (image 62). Greatest acquired peak systolic velocity with the proximal left ICA measures 422 centimeters/second (image 63). LEFT VERTEBRAL ARTERY:   Antegrade flow IMPRESSION: 1. 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 2. Moderate to large amount of left-sided atherosclerotic plaque results in a sonographic string sign and markedly elevated peak systolic velocities within the left internal carotid artery compatible with a subtotal occlusion. Further evaluation with CTA as clinically indicated. 3. Antegrade flow demonstrated within the bilateral vertebral arteries. These results will be called to the ordering clinician or representative by the Radiologist Assistant, and communication documented in the PACS or [employer]. Electronically Signed   By: Simonne Come M.D.   On: 03/07/2020 12:59   ECHOCARDIOGRAM COMPLETE  Result Date: 03/07/2020    ECHOCARDIOGRAM REPORT   Patient Name:   Craig Perry Date of Exam: 03/07/2020 Medical Rec #:  [id number]         Height:       71.5 in Accession #:    [id number]        Weight:       140.0 lb Date of Birth:  December 04, 1960         BSA:          1.821 m Patient Age:    59 years          BP:           120/77 mmHg Patient Gender: M                 HR:           84 bpm. [facility] Procedure: 2D Echo Indications:    Stroke 434.91 / I163.9  History:        Patient has prior history of Echocardiogram examinations, most                 recent 11/19/2017. CAD and Previous Myocardial Infarction,                 Stroke; Risk Factors:Current Smoker and Hypertension. ETOH,                 ETOH.  Sonographer:    Jeryl Columbia RDCS (AE) Referring Phys: [id number] ROBERT SCHERTZ IMPRESSIONS  1. Left ventricular ejection fraction, by estimation, is 40 to 45%. The left ventricle has mildly decreased function. The left ventricle demonstrates global hypokinesis. Left ventricular diastolic parameters were normal.  2. Right ventricular systolic function is normal. The right ventricular size is normal.  3. The  mitral valve is normal in structure. No evidence of mitral valve regurgitation. No evidence of mitral stenosis.  4. The aortic valve has an indeterminant number of cusps. Aortic valve regurgitation is not visualized. No aortic stenosis is present.  5. The inferior vena cava is normal in size with greater than 50% respiratory variability, suggesting right atrial pressure of 3 mmHg. FINDINGS  Left Ventricle: Left ventricular ejection fraction, by estimation, is 40 to 45%. The left ventricle has mildly decreased function. The left ventricle demonstrates global hypokinesis. The left ventricular internal cavity size was normal in size. There is  no left ventricular hypertrophy. Left ventricular diastolic parameters were normal. Right Ventricle: The right ventricular size is normal. No increase in right ventricular wall  thickness. Right ventricular systolic function is normal. Left Atrium: Left atrial size was normal in size. Right Atrium: Right atrial size was normal in size. Pericardium: There is no evidence of pericardial effusion. Mitral Valve: The mitral valve is normal in structure. No evidence of mitral valve regurgitation. No evidence of mitral valve stenosis. Tricuspid Valve: The tricuspid valve is normal in structure. Tricuspid valve regurgitation is not demonstrated. No evidence of tricuspid stenosis. Aortic Valve: The aortic valve has an indeterminant number of cusps. Aortic valve regurgitation is not visualized. No aortic stenosis is present. Aortic valve mean gradient measures 3.1 mmHg. Aortic valve peak gradient measures 5.3 mmHg. Aortic valve area, by VTI measures 2.31 cm. Pulmonic Valve: The pulmonic valve was not well visualized. Pulmonic valve regurgitation is not visualized. No evidence of pulmonic stenosis. Aorta: The aortic root is normal in size and structure. Pulmonary Artery: Indeterminant PASP, inadequate TR jet. Venous: The inferior vena cava is normal in size with greater than 50% respiratory  variability, suggesting right atrial pressure of 3 mmHg. IAS/Shunts: No atrial level shunt detected by color flow Doppler.  LEFT VENTRICLE PLAX 2D LVIDd:         5.46 cm  Diastology LVIDs:         4.50 cm  LV e' medial:    8.92 cm/s LV PW:         0.82 cm  LV E/e' medial:  7.2 LV IVS:        1.01 cm  LV e' lateral:   12.10 cm/s LVOT diam:     2.20 cm  LV E/e' lateral: 5.3 LV SV:         56 LV SV Index:   31 LVOT Area:     3.80 cm  RIGHT VENTRICLE RV S prime:     10.90 cm/s TAPSE (M-mode): 1.7 cm LEFT ATRIUM             Index       RIGHT ATRIUM          Index LA diam:        3.30 cm 1.81 cm/m  RA Area:     8.93 cm LA Vol (A2C):   30.4 ml 16.69 ml/m RA Volume:   19.00 ml 10.43 ml/m LA Vol (A4C):   23.6 ml 12.96 ml/m LA Biplane Vol: 26.8 ml 14.71 ml/m  AORTIC VALVE AV Area (Vmax):    2.51 cm AV Area (Vmean):   2.36 cm AV Area (VTI):     2.31 cm AV Vmax:           115.57 cm/s AV Vmean:          82.487 cm/s AV VTI:            0.244 m AV Peak Grad:      5.3 mmHg AV Mean Grad:      3.1 mmHg LVOT Vmax:         76.27 cm/s LVOT Vmean:        51.272 cm/s LVOT VTI:          0.148 m LVOT/AV VTI ratio: 0.61  AORTA Ao Root diam: 3.30 cm MITRAL VALVE MV Area (PHT): 3.68 cm    SHUNTS MV Decel Time: 206 msec    Systemic VTI:  0.15 m MV E velocity: 63.80 cm/s  Systemic Diam: 2.20 cm MV A velocity: 59.10 cm/s MV E/A ratio:  1.08 Dina Rich MD Electronically signed by Dina Rich MD Signature Date/Time: 03/07/2020/12:56:34 PM  Final        LOS: 1 day   Craig Perry [employer]Krishnan  [facility] Pager on [email or web address]  03/08/2020, 9:55 AM

## 2020-03-08 NOTE — Progress Notes (Signed)
Patient arrived to unit, received from [facility] transport

## 2020-03-08 NOTE — Consult Note (Signed)
Neurology Consultation Reason for Consult: Acute left MCA infarct Referring Physician: Dr. Rito Ehrlich  CC: AMS and slurred speech  History is obtained from: chart review. History from patient limited due to aphasia.   HPI: Craig Perry is a 59 y.o. male with a PMH significant for essential hypertension, MI, two-vessel CAD s/p PCI to RCA in 2018, CVA, cocaine abuse, ETOH cardiomyopathy, and tobacco use. He presented to [facility] on 12/6 with AMS and slurred speech and was subsequently found to have an acute left MCA infarct. MRA head and neck revealed the patient had critical stenosis of the left ICA and complete occlusion of the right carotid and was transferred to [facility] on 12/8 for vascular evaluation.   LKW: UTA due to patient aphasia, no LKW per chart review. tpa given?: no, LKW time unknown  NIHSS:  1a Level of Conscious.: 0 1b LOC Questions: 1 1c LOC Commands: 0 2 Best Gaze: 0 3 Visual: 0 4 Facial Palsy: 0 5a Motor Arm - left: 0 5b Motor Arm - Right: 1 6a Motor Leg - Left: 0 6b Motor Leg - Right: 0 7 Limb Ataxia: 0 8 Sensory: 0 9 Best Language: 1 10 Dysarthria: 0 11 Extinct. and Inatten.: 0 TOTAL: 3  ROS: A 14 point ROS was performed and is negative except as noted in the HPI.   Past Medical History:  Diagnosis Date  . Alcohol abuse   . CAD (coronary artery disease)    a. 2014 cath with non obst CAD, EF 35%.    b. 04/2016: inferior STEMI s/p DES to RCA. 70% LAD with no intervention.  . CHF (congestive heart failure) (HCC)   . Cocaine abuse (HCC)   . History of noncompliance with medical treatment, presenting hazards to health   . Hypertension   . Myocardial infarction (HCC)   . Pneumothorax   . Stroke [facility])     Family History  Problem Relation Age of Onset  . Heart attack Mother   . Diabetes Mother   . Stroke Mother   . Heart attack Father   . Diabetes Father     Social History:  reports that he has been smoking cigarettes. He has a 90.00 pack-year smoking  history. He has never used smokeless tobacco. He reports current alcohol use of about 6.0 standard drinks of alcohol per week. He reports current drug use. Drug: Cocaine.  Exam: Current vital signs: BP 124/79 (BP Location: Right Arm)   Pulse (!) 104   Temp 98.2 F (36.8 C) (Oral)   Resp 19   Wt 65.4 kg   SpO2 95%   BMI 19.83 kg/m  Vital signs in last 24 hours: Temp:  [97.7 F (36.5 C)-98.2 F (36.8 C)] 98.2 F (36.8 C) (12/08 1523) Pulse Rate:  [93-104] 104 (12/08 1523) Resp:  [18-20] 19 (12/08 1523) BP: (118-135)/(55-90) 124/79 (12/08 1523) SpO2:  [95 %-98 %] 95 % (12/08 1523) Weight:  [65.4 kg] 65.4 kg (12/08 0334)   Physical Exam  Constitutional: Appears disheveled, alert, oriented only to self. Calm and cooperative with exam. Psych: Affect appropriate to situation, becomes frustrated when attempting word finding Head: Normocephalic, atraumatic.  EENT: No OP obstrucion, neck supple, no scleral injection. Cardiovascular: warm extremities without edema, redness, or rashes bilaterally.   Respiratory: Respiratory effort normal, symmetrical bilateral chest rise with respirations.  GI: Abdomen soft without distension or tenderness.  Skin: WDL. No edema or rashes noted.   Neuro: Mental Status: Patient is awake, alert, oriented to person only.  States it is 2015, we are in [place], and is unable to communicate reason for hospitalization secondary to expressive aphasia. He has significant perseveration, but is able to name simple objects. Able to repeat a simple phrase, but refuses to try after initial pharse.  Cranial Nerves: II: Visual Fields are full. Pupils are 10mm, equal, round, and reactive to light bilaterally. III,IV, VI: EOMI without ptosis or diploplia.  V: Facial sensation is symmetric to touch and temperature VII: Facial movement is symmetric.  VIII: Hearing is intact to voice X: Uvula elevates symmetrically XI: Shoulder shrug is symmetric. XII: tongue is midline  without atrophy or fasciculations.  Motor: Tone is normal. Bulk is normal. 5/5 strength was present in all four extremities. LUE slightly stronger than RUE on exam with slight arm drift on the RUE.  Sensory: Sensation is symmetric to light touch and temperature in the arms and legs bilaterally. Deep Tendon Reflexes: 2+ and symmetric in the biceps and patellae.  Plantars: Toes are downgoing bilaterally.  Cerebellar: FNF and HKS are intact bilaterally.  I have reviewed labs in epic and the results pertinent to this consultation are: UDS + for benzodiazepines and cocaine.  I have reviewed the images obtained:  MRA head 03/06/20:  IMPRESSION: 1. Acute anterior left MCA territory infarcts with associated severe stenosis of the superior left M2 MCA branch. 2. Asymmetric chronic microvascular ischemic change involving the left MCA territory with a remote infarct in the left parietal lobe. These findings suggest an element of chronic ischemia/hypoperfusion to the left MCA territory.  MRI head without contrast 12/6 IMPRESSION: 1. Acute anterior left MCA territory infarcts with associated severe stenosis of the superior left M2 MCA branch. 2. Asymmetric chronic microvascular ischemic change involving the left MCA territory with a remote infarct in the left parietal lobe. These findings suggest an element of chronic ischemia/hypoperfusion to the left MCA territory.  Carotid US- bilateral 03/07/20 IMPRESSION: 1. 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 2. Moderate to large amount of left-sided atherosclerotic plaque results in a sonographic string sign and markedly elevated peak systolic velocities within the left internal carotid artery compatible with a subtotal occlusion. Further evaluation with CTA as clinically indicated. 3. Antegrade flow demonstrated within the  bilateral vertebral Arteries.  Assessment:  [name] is a 59 year old male presenting due to aphasia and AMS secondary to acute left MCA stroke. Patient with multiple stroke risk factors including tobacco and drug abuse, previous PCI for CAD, and essential hypertension. Radiologic images revealed a complete occlusion of the right carotid artery and critical stenosis of the left ICA. With no symptoms or evidence on the right side, suspect that the right common carotid occlusion is chronic. Left ICA stenosis with associated symptomatic patient and high risk for evolving or new ischemic stroke, agree with vascular surgery to proceed with CEA of left carotid.    Recommendations:  - Frequent neuro checks - Hold home antihypertensives at this time;allow permissive hypertension, keep SBP <220 until CEA procedure. - Continue prophylactic therapy-Antiplatelet med: Aspirin - dose 324mg  PO or 300mg  PR daily, continue Plavix in accordance to vascular CEA.   - Risk factor modification; smoking, drug use, and ETOH cessation education. New antiplatelet medication education for compliance with home use. - PT/OT/ST consulted - Stroke team to follow   If 7pm- 7am, please page neurology on call as listed in AMION. , NP  I seen  the patient reviewed the above note and the assessment/plan were jointly developed.  He has what is likely an embolic stroke on the left, most likely due to his stenosis of the left ICA.  Much less likely would be a cardioembolic source, and it is reasonable to continue telemetry, but agree with pursuing carotid revascularization per vascular surgery.  Ritta Slot, MD [facility] [phone]  If 7pm- 7am, please page neurology on call as listed in AMION.

## 2020-03-09 ENCOUNTER — Encounter (HOSPITAL_COMMUNITY): Payer: Self-pay

## 2020-03-09 LAB — CBC
HCT: 39.2 % (ref 39.0–52.0)
Hemoglobin: 13.1 g/dL (ref 13.0–17.0)
MCH: 30.6 pg (ref 26.0–34.0)
MCHC: 33.4 g/dL (ref 30.0–36.0)
MCV: 91.6 fL (ref 80.0–100.0)
Platelets: 147 10*3/uL — ABNORMAL LOW (ref 150–400)
RBC: 4.28 MIL/uL (ref 4.22–5.81)
RDW: 13.2 % (ref 11.5–15.5)
WBC: 4.9 10*3/uL (ref 4.0–10.5)
nRBC: 0 % (ref 0.0–0.2)

## 2020-03-09 LAB — COMPREHENSIVE METABOLIC PANEL WITH GFR
ALT: 9 U/L (ref 0–44)
AST: 13 U/L — ABNORMAL LOW (ref 15–41)
Albumin: 3.1 g/dL — ABNORMAL LOW (ref 3.5–5.0)
Alkaline Phosphatase: 53 U/L (ref 38–126)
Anion gap: 8 (ref 5–15)
BUN: 12 mg/dL (ref 8–23)
CO2: 23 mmol/L (ref 22–32)
Calcium: 9 mg/dL (ref 8.9–10.3)
Chloride: 108 mmol/L (ref 98–111)
Creatinine, Ser: 0.87 mg/dL (ref 0.61–1.24)
GFR, Estimated: >60 mL/min
Glucose, Bld: 106 mg/dL — ABNORMAL HIGH (ref 70–99)
Potassium: 4 mmol/L (ref 3.5–5.1)
Sodium: 139 mmol/L (ref 135–145)
Total Bilirubin: 0.6 mg/dL (ref 0.3–1.2)
Total Protein: 5.6 g/dL — ABNORMAL LOW (ref 6.5–8.1)

## 2020-03-09 LAB — SURGICAL PCR SCREEN
MRSA, PCR: NEGATIVE
Staphylococcus aureus: NEGATIVE

## 2020-03-09 LAB — MAGNESIUM: Magnesium: 2.1 mg/dL (ref 1.7–2.4)

## 2020-03-09 MED ORDER — FLUOXETINE HCL 20 MG PO CAPS
20.0000 mg | ORAL_CAPSULE | Freq: Every day | ORAL | Status: DC
  Administered 2020-03-09 – 2020-03-11 (×2): 20 mg via ORAL
  Filled 2020-03-09 (×3): qty 1

## 2020-03-09 MED ORDER — SODIUM CHLORIDE 0.9 % IV SOLN
1.5000 g | INTRAVENOUS | Status: AC
  Administered 2020-03-10: 1.5 g via INTRAVENOUS
  Filled 2020-03-09: qty 1.5

## 2020-03-09 MED ORDER — MUPIROCIN 2 % EX OINT
1.0000 "application " | TOPICAL_OINTMENT | Freq: Two times a day (BID) | CUTANEOUS | Status: DC
  Administered 2020-03-09 – 2020-03-11 (×4): 1 via NASAL
  Filled 2020-03-09: qty 22

## 2020-03-09 NOTE — Evaluation (Signed)
Physical Therapy Evaluation Patient Details Name: Craig Perry MRN: [id number] DOB: 1960/08/08 Today's Date: 03/09/2020   History of Present Illness  Pt is a 59 year old male who presented to [facility] ED with altered mental status and slurred speech. MRI revealed acute L anterior MCA infarct with severe stenosis of L ICA and complete occlusion of R carotid, and thus was transferred to [facility] for further vascular surgery eval. Plan for L CEA Friday 03/10/20. No tPA administered. NIHSS 2-0. PMH: CVA, pneumothorax, MI, HTN, hx of noncompliance with medical treatment, cocaine abuse, CHF, CAD, and alcohol abuse.  Clinical Impression  Pt presents with condition mentioned above and deficits mentioned below, see PT Problem List. Pt was independent with all mobility living at home alone and working as a Curator full-time previously. He has a friend that can assist him 24/7 and drive him to appointments if needed. Pt displays difficulty formulating words and decreased processing speed compared to his assumed and reported baseline. He demonstrates a mild trunk sway and decreased gait speed compared to his baseline also. Due to his cognitive and balance changes his safety with mobility has declined. This is supported by his DGI score of 14 this date. He required min guard for all transfers, gait without UE support, and stairs without UE support this date. Assuming pt's cognitive and functional status do not change following planned procedure then recommend follow-up with outpatient PT at neuro specialty clinic, and if not that [facility] PT. Will continue to follow acutely.    Follow Up Recommendations Outpatient PT;Supervision for mobility/OOB (if no outpatient then [facility] PT assuming no change in status following surgery)    Equipment Recommendations  None recommended by PT    Recommendations for Other Services       Precautions / Restrictions Precautions Precautions: Fall Restrictions Weight Bearing  Restrictions: No      Mobility  Bed Mobility Overal bed mobility: Independent             General bed mobility comments: Pt able to transition to EOB independently.    Transfers Overall transfer level: Needs assistance Equipment used: None Transfers: Sit to/from [employer] Sit to Stand: Min guard Stand pivot transfers: Min guard       General transfer comment: Mild trunk sway noted with coming to stand and with side steps to chair, thus min guard for safety. No overt LOB.  Ambulation/Gait Ambulation/Gait assistance: Min guard Gait Distance (Feet): 200 Feet Assistive device: None Gait Pattern/deviations: Step-through pattern;Decreased stride length;Narrow base of support Gait velocity: decreased Gait velocity interpretation: 1.31 - 2.62 ft/sec, indicative of limited community ambulator General Gait Details: Pt ambulates at decreased speed compared to reported normal with mild trunk sway and intermittent extra lateral steps to maintain balance. Pt reports this is not baseline. Min guard for safety but no overt LOB. Pt has to slow gait and requires verbal cues when provided challenges during gait.  Stairs Stairs: Yes Stairs assistance: Min guard Stair Management: No rails;Alternating pattern Number of Stairs: 3 General stair comments: Ascending and descending stairs without rail and with min guard for safety due to mild trunk sway. No overt LOB.  Wheelchair Mobility    Modified Rankin (Stroke Patients Only) Modified Rankin (Stroke Patients Only) Pre-Morbid Rankin Score: No symptoms Modified Rankin: Moderately severe disability     Balance Overall balance assessment: Needs assistance Sitting-balance support: No upper extremity supported;Feet unsupported Sitting balance-Leahy Scale: Good Sitting balance - Comments: Sitting EOB no LOB, supervision for  safety.   Standing balance support: No upper extremity supported;During functional  activity Standing balance-Leahy Scale: Good Standing balance comment: No overt LOB and no UE support, but mild trunk sway noted with mobility.                 Standardized Balance Assessment Standardized Balance Assessment : Dynamic Gait Index   Dynamic Gait Index Level Surface: Mild Impairment Change in Gait Speed: Moderate Impairment Gait with Horizontal Head Turns: Moderate Impairment Gait with Vertical Head Turns: Moderate Impairment Gait and Pivot Turn: Mild Impairment Step Over Obstacle: Mild Impairment Step Around Obstacles: Mild Impairment Steps: Normal Total Score: 14       Pertinent Vitals/Pain Pain Assessment: No/denies pain    Home Living Family/patient expects to be discharged to:: Private residence Living Arrangements: Alone Available Help at Discharge: Friend(s);Available 24 hours/day Type of Home: Mobile home Home Access: Stairs to enter Entrance Stairs-Rails: None Entrance Stairs-Number of Steps: 2 Home Layout: One level Home Equipment: None      Prior Function Level of Independence: Independent         Comments: independent in mobility, ADLs, works as a Financial trader   Dominant Hand: Right    Extremity/Trunk Assessment   Upper Extremity Assessment Upper Extremity Assessment: Defer to OT evaluation    Lower Extremity Assessment Lower Extremity Assessment: RLE deficits/detail;LLE deficits/detail RLE Deficits / Details: MMT grossly 4 to 4+ RLE Sensation: WNL (difficulty getting words out for dyn proprio testing) RLE Coordination: decreased gross motor (decreased speed with foot taps) LLE Deficits / Details: MMT grossly 4 to 4+ LLE Sensation: WNL (difficulty getting words out for dyn proprio testing) LLE Coordination: decreased gross motor (decreased speed with foot taps)    Cervical / Trunk Assessment Cervical / Trunk Assessment: Normal  Communication   Communication: Expressive difficulties (slurred speech)   Cognition Arousal/Alertness: Awake/alert Behavior During Therapy: Flat affect Overall Cognitive Status: Impaired/Different from baseline Area of Impairment: Orientation;Following commands;Safety/judgement;Awareness;Problem solving                 Orientation Level: Time;Situation;Place (stated "[facility]" and unable to find year)     Following Commands: Follows one step commands with increased time;Follows multi-step commands with increased time Safety/Judgement: Decreased awareness of safety;Decreased awareness of deficits Awareness: Emergent Problem Solving: Slow processing;Difficulty sequencing;Requires verbal cues General Comments: A&O to person, but initially stated January but corrected to December for month and unable to identify correct exact date or year. Stated "[facility]" for location and was unable to identify situation. Slow processing with decreased awareness of deficits impacting his safety.      General Comments General comments (skin integrity, edema, etc.): O2 vis [place] diffed upon arrival and sats WNL but decreased after ~1/2 gait bout to 91% thus donned at 2L/min but doffed once sitting in chair at end of session with SpO2 >/= 96%    Exercises     Assessment/Plan    PT Assessment Patient needs continued PT services  PT Problem List Decreased strength;Decreased activity tolerance;Decreased balance;Decreased mobility;Decreased coordination;Decreased cognition;Decreased safety awareness       PT Treatment Interventions Gait training;Stair training;Functional mobility training;Therapeutic activities;Therapeutic exercise;Balance training;Neuromuscular re-education;Cognitive remediation;Patient/family education    PT Goals (Current goals can be found in the Care Plan section)  Acute Rehab PT Goals Patient Stated Goal: to get back to work PT Goal Formulation: With patient Time For Goal Achievement: 03/23/20 Potential to Achieve Goals: Good     Frequency Min  3X/week   Barriers to discharge        Co-evaluation               AM-PAC PT "6 Clicks" Mobility  Outcome Measure Help needed turning from your back to your side while in a flat bed without using bedrails?: None Help needed moving from lying on your back to sitting on the side of a flat bed without using bedrails?: None Help needed moving to and from a bed to a chair (including a wheelchair)?: A Little Help needed standing up from a chair using your arms (e.g., wheelchair or bedside chair)?: A Little Help needed to walk in hospital room?: A Little Help needed climbing 3-5 steps with a railing? : A Little 6 Click Score: 20    End of Session Equipment Utilized During Treatment: Gait belt;Oxygen Activity Tolerance: Patient tolerated treatment well Patient left: in chair;with call bell/phone within reach;with chair alarm set Nurse Communication: Mobility status;Other (comment) (O2 sats) PT Visit Diagnosis: Unsteadiness on feet (R26.81);Other abnormalities of gait and mobility (R26.89);Muscle weakness (generalized) (M62.81);Other symptoms and signs involving the nervous system (R29.898)    Time: 3785-8850 PT Time Calculation (min) (ACUTE ONLY): 37 min   Charges:   PT Evaluation $PT Eval Moderate Complexity: 1 Mod PT Treatments $Gait Training: 8-22 mins        Raymond Gurney, PT, DPT Acute Rehabilitation Services  Pager: [phone] Office: [phone]   [name] 03/09/2020, 12:17 PM

## 2020-03-09 NOTE — Progress Notes (Signed)
  Speech Language Pathology Treatment: Cognitive-Linquistic  Patient Details Name: Craig Perry MRN: [id number] DOB: Aug 22, 1960 Today's Date: 03/09/2020 Time: 1215-1242 SLP Time Calculation (min) (ACUTE ONLY): 27 min  Assessment / Plan / Recommendation Clinical Impression  Pt seen for aphasia tx with emotional lability and verbal agitation noted within session within confrontational tasks, convergent/divergent and answering questions within simple conversation.  Overall accuracy with mod verbal cues provided was approximately 65%. Pt would often state "I don't know" or become teary during verbal interactions.  He was able to name familiar items within a category with sentence completion, mod verbal cues, phonemic/semantic cues and mod encouragement/education provided throughout session.  Pt was able to get phrases out inconsistently such as (referring to glasses), "They used to help me see, now they don't!" Discussed importance of outpatient therapy if pt is able to attend once discharged and he stated,  "I know I need it." Financial/transportation limitations may impact his ability to participate in ST sessions once discharged from hospital.  ST will continue to f/u while in acute setting.     HPI HPI: 59 y.o. 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  In ED pt was having sig difficulties w/ speaking. MRI shows: Acute anterior left MCA territory infarcts with associated severe stenosis of the superior left M2 MCA branch. Asymmetric chronic microvascular ischemic change involving the left MCA territory with a remote infarct in the left parietal lobe. These findings suggest an element of chronic ischemia/hypoperfusion to the left MCA territory      SLP Plan  Continue with current plan of care       Recommendations    Outpatient ST                Follow up Recommendations: Outpatient SLP SLP Visit Diagnosis: Aphasia (R47.01) Plan: Continue with current plan of care                       Tressie Stalker, M.S., CCC-SLP 03/09/2020, 2:58 PM

## 2020-03-09 NOTE — Progress Notes (Signed)
   Plan for left CEA Friday 03/10/20 Pr op orders placed.  NPO past MN  [employer] PA-C

## 2020-03-09 NOTE — Progress Notes (Signed)
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  He was found to have an acute left MCA infarct.  Patient was initially admitted to any pain hospital.  Work-up revealed critical stenosis in the left carotid artery.  He was subsequently transferred here for vascular input.  Reason for Visit: Acute stroke.  Consultants: Vascular surgery.  Neurology  Procedures: None yet  Antibiotics: Anti-infectives (From admission, onward)   Start     Dose/Rate Route Frequency Ordered Stop   03/10/20 1145  cefUROXime (ZINACEF) 1.5 g in sodium chloride 0.9 % 100 mL IVPB        1.5 g 200 mL/hr over 30 Minutes Intravenous To [facility] 03/09/20 0750 03/11/20 1145      Subjective/Interval History: Patient denies any complaints this morning.  No weakness of any one side of his body.  No shortness of breath.      Assessment/Plan:  Acute left MCA territory infarct/critical stenosis of left ICA Patient experienced slurred speech and altered mental status at presentation.  Imaging studies revealed left MCA infarct.   Patient was started on aspirin and Plavix.   LDL 80.  Continue statin.  HbA1c 5.7.   Urine drug screen positive for cocaine. PT and OT evaluation. CT angiogram showed critical stenosis in the left ICA.  Discussed with vascular surgery was consulted.  Plan is for CEA tomorrow. Echocardiogram shows EF of 40 to 45%. Neurology is following.  History of coronary artery disease  Patient has history of NSTEMI previously.  He has two-vessel disease with PCI to RCA in  2018.  Currently seems to be stable from a cardiac standpoint.  Continue antiplatelet agents and statin.  Also on beta-blocker.  History of cardiomyopathy thought to be secondary to alcohol Previously had a EF of 35% back in 2019.  Echocardiogram from this hospitalization shows EF of 40 to 45% with global diffuse hypokinesis.  Patient previously unable to take ACE inhibitor or ARB due to soft blood pressure.  Follow-up with cardiology in the outpatient setting.  History of cocaine abuse Urine drug screen positive for cocaine.  He will need extensive counseling.  History of tobacco abuse Nicotine patch.  History of alcohol abuse Drinks at least 5-6 beers on a daily basis.  High risk for alcohol withdrawal.  Continue to monitor closely.  CIWA protocol.  Thiamine and folic acid. No evidence for withdrawal currently.   DVT Prophylaxis: Lovenox Code Status: Full code Family Communication: Discussed with the patient.  No family at bedside Disposition Plan: Hopefully return home on Saturday  Status is: Inpatient  Remains inpatient appropriate because:Ongoing diagnostic testing needed not appropriate for outpatient work up, IV treatments appropriate due to intensity of illness or inability to take PO and Inpatient level of care appropriate due to severity of illness   Dispo: The patient is from: Home              Anticipated d/c is to: Home              Anticipated d/c date is: 2 days              Patient currently is  not medically stable to d/c.      Medications:  Scheduled: . aspirin  81 mg Oral Daily  . atorvastatin  40 mg Oral Daily  . carvedilol  3.125 mg Oral BID  . clopidogrel  75 mg Oral Daily  . enoxaparin (LOVENOX) injection  40 mg Subcutaneous Q24H  . folic acid  1 mg Oral Daily  . LORazepam  0-4 mg Intravenous Q6H   Followed by  . LORazepam  0-4 mg Intravenous Q12H  . multivitamin with minerals  1 tablet Oral Daily  . nicotine  21 mg Transdermal Daily  . thiamine   100 mg Oral Daily   Or  . thiamine  100 mg Intravenous Daily   Continuous: . sodium chloride 100 mL/hr (03/08/20 2237)  . [START ON 03/10/2020] cefUROXime (ZINACEF)  IV     YQM:VHQIONGEXBMWU **OR** acetaminophen (TYLENOL) oral liquid 160 mg/5 mL **OR** acetaminophen, LORazepam **OR** LORazepam, senna-docusate   Objective:  Vital Signs  Vitals:   03/08/20 2000 03/08/20 2300 03/09/20 0400 03/09/20 0712  BP: 113/70 114/63 121/67 113/71  Pulse: 100   81  Resp: 17   (!) 21  Temp: 98.7 F (37.1 C) 98.1 F (36.7 C) 98.7 F (37.1 C) 97.7 F (36.5 C)  TempSrc: Oral Oral Oral Oral  SpO2:    99%  Weight:        Intake/Output Summary (Last 24 hours) at 03/09/2020 1023 Last data filed at 03/09/2020 0400 Gross per 24 hour  Intake --  Output 2900 ml  Net -2900 ml   Filed Weights   03/06/20 2306 03/08/20 0334  Weight: 63.5 kg 65.4 kg    General appearance: Awake alert.  In no distress Resp: Clear to auscultation bilaterally.  Normal effort Cardio: S1-S2 is normal regular.  No S3-S4.  No rubs murmurs or bruit GI: Abdomen is soft.  Nontender nondistended.  Bowel sounds are present normal.  No masses organomegaly Extremities: No edema.  Full range of motion of lower extremities. Neurologic:   No focal neurological deficits.     Lab Results:  Data Reviewed: I have personally reviewed following labs and imaging studies  CBC: Recent Labs  Lab 03/06/20 1435 03/07/20 2149 03/09/20 0351  WBC 5.3  5.9 5.2 4.9  NEUTROABS 3.0  --   --   HGB 14.7  15.4 14.2 13.1  HCT 44.7  44.6 43.0 39.2  MCV 93.9  92.3 94.1 91.6  PLT 193  202 183 147*    Basic Metabolic Panel: Recent Labs  Lab 03/06/20 1435 03/07/20 2149 03/09/20 0351  NA 136 133* 139  K 4.1 3.8 4.0  CL 101 101 108  CO2 GLUCOSE 121* 92 106*  BUN CREATININE 0.93 0.72 0.87  CALCIUM 9.5 9.0 9.0  MG  --  2.3 2.1  PHOS  --  2.6  --     GFR: Estimated Creatinine Clearance: 82.5 mL/min (by  C-G formula based on SCr of 0.87 mg/dL).  Liver Function Tests: Recent Labs  Lab 03/06/20 1435 03/07/20 2149 03/09/20 0351  AST 18 15 13*  ALT ALKPHOS 67 62 53  BILITOT 0.6 0.6 0.6  PROT 7.6 6.8 5.6*  ALBUMIN 4.2 3.8 3.1*     Coagulation Profile: Recent Labs  Lab 03/06/20 1630  INR 1.0    HbA1C: Recent Labs    03/07/20 0526  HGBA1C 5.7*    CBG: Recent Labs  Lab 03/06/20 1419  GLUCAP 123*  Lipid Profile: Recent Labs    03/07/20 0526  CHOL 137  HDL 36*  LDLCALC 80  TRIG 616  CHOLHDL 3.8     Recent Results (from the past 240 hour(s))  Resp Panel by RT-PCR (Flu A&B, Covid) Nasopharyngeal Swab     Status: None   Collection Time: 03/06/20  5:16 PM   Specimen: Nasopharyngeal Swab; Nasopharyngeal(NP) swabs in vial transport medium  Result Value Ref Range Status   SARS Coronavirus 2 by RT PCR NEGATIVE NEGATIVE Final    Comment: (NOTE) SARS-CoV-2 target nucleic acids are NOT DETECTED.  The SARS-CoV-2 RNA is generally detectable in upper respiratory specimens during the acute phase of infection. The lowest concentration of SARS-CoV-2 viral copies this assay can detect is 138 copies/mL. A negative result does not preclude SARS-Cov-2 infection and should not be used as the sole basis for treatment or other patient management decisions. A negative result may occur with  improper specimen collection/handling, submission of specimen other than nasopharyngeal swab, presence of viral mutation(s) within the areas targeted by this assay, and inadequate number of viral copies(<138 copies/mL). A negative result must be combined with clinical observations, patient history, and epidemiological information. The expected result is Negative.  Fact Sheet for Patients:  BloggerCourse.com  Fact Sheet for Healthcare Providers:  SeriousBroker.it  This test is no t yet approved or cleared by the [place] FDA  and  has been authorized for detection and/or diagnosis of SARS-CoV-2 by FDA under an Emergency Use Authorization (EUA). This EUA will remain  in effect (meaning this test can be used) for the duration of the COVID-19 declaration under Section 564(b)(1) of the Act, 21 U.S.C.section 360bbb-3(b)(1), unless the authorization is terminated  or revoked sooner.       Influenza A by PCR NEGATIVE NEGATIVE Final   Influenza B by PCR NEGATIVE NEGATIVE Final    Comment: (NOTE) The Xpert Xpress SARS-CoV-2/FLU/RSV plus assay is intended as an aid in the diagnosis of influenza from Nasopharyngeal swab specimens and should not be used as a sole basis for treatment. Nasal washings and aspirates are unacceptable for Xpert Xpress SARS-CoV-2/FLU/RSV testing.  Fact Sheet for Patients: BloggerCourse.com  Fact Sheet for Healthcare Providers: SeriousBroker.it  This test is not yet approved or cleared by the [place] FDA and has been authorized for detection and/or diagnosis of SARS-CoV-2 by FDA under an Emergency Use Authorization (EUA). This EUA will remain in effect (meaning this test can be used) for the duration of the COVID-19 declaration under Section 564(b)(1) of the Act, 21 U.S.C. section 360bbb-3(b)(1), unless the authorization is terminated or revoked.  Performed at [facility], [address], [place], [place] [address]       Radiology Studies: CT ANGIO HEAD W OR WO CONTRAST  Result Date: 03/07/2020 CLINICAL DATA:  59 year old male with right CCA occlusion and high-grade left ICA stenosis on Doppler ultrasound yesterday. Small left MCA territory infarct on MRI yesterday. Severe left MCA M2 stenosis. EXAM: CT ANGIOGRAPHY HEAD AND NECK TECHNIQUE: Multidetector CT imaging of the head and neck was performed using the standard protocol during bolus administration of intravenous contrast. Multiplanar CT image reconstructions and MIPs were  obtained to evaluate the vascular anatomy. Carotid stenosis measurements (when applicable) are obtained utilizing NASCET criteria, using the distal internal carotid diameter as the denominator. CONTRAST:  65mL OMNIPAQUE IOHEXOL 350 MG/ML SOLN COMPARISON:  Brain MRI and intracranial MRA yesterday. Carotid Doppler ultrasound yesterday. Head CT 03/06/2020. FINDINGS: CT HEAD Brain: Calcified atherosclerosis at the skull  base. No significant change in acute on chronic left MCA territory infarcts with cytotoxic edema centered at the insula, superior left perirolandic encephalomalacia. No acute intracranial hemorrhage identified. No midline shift, mass effect, or evidence of intracranial mass lesion. No ventriculomegaly. Stable gray-white matter differentiation elsewhere. Calvarium and skull base: Negative. Paranasal sinuses: Visualized paranasal sinuses and mastoids are stable and well pneumatized. Orbits: Small right lateral scalp lipoma is stable. Other visualized orbits and scalp soft tissues are within normal limits. CTA NECK Skeleton: Largely absent dentition. Cervical spine degeneration. No acute osseous abnormality identified. Upper chest: Emphysema. Biapical lung scarring. No superior mediastinal lymphadenopathy. Other neck: Negative. Aortic arch: 3 vessel arch configuration with moderate soft and calcified arch atherosclerosis. Right carotid system: Brachiocephalic artery soft plaque without stenosis. Occluded right CCA just beyond the origin (series 8, image 118). Reconstituted right carotid bifurcation. Superimposed calcified plaque at the right ICA origin and bulb with less than 50 % stenosis with respect to the distal vessel. Left carotid system: Left CCA origin soft and calcified plaque without stenosis. Additional soft plaque proximal to the bifurcation without stenosis. Short segment critical stenosis at the left ICA origin (series 9, image 134), short segment radiographic string sign. Additional plaque in  the bulb but no other significant stenosis to the skull base. Vertebral arteries: Mild proximal right subclavian artery plaque without stenosis. No right vertebral artery origin plaque or stenosis. Right V2 calcified plaque and tortuosity without significant stenosis. Moderate proximal left subclavian artery plaque with less than 50% stenosis. Mild plaque at the left vertebral artery origin without stenosis. Tortuous left V1 segment. Codominant left vertebral with occasional plaque and tortuosity in the V2 segment but no significant stenosis to the skull base. CTA HEAD Posterior circulation: Codominant V4 segments are patent to the basilar without plaque or stenosis. Normal PICA origins. Patent basilar artery without stenosis. Normal SCA and PCA origins. Small bilateral posterior communicating arteries. Tortuous left P1 segment. Bilateral PCA branches are within normal limits. Anterior circulation: Both ICA siphons are patent. Soft and calcified plaque on the left results in mild distal petrous and supraclinoid stenosis. Mostly calcified plaque on the right with mild if any siphon stenosis. Patent carotid termini. Normal MCA and ACA origins. Anterior communicating artery and bilateral ACA branches are within normal limits. Right MCA M1 segment and bifurcation are patent without stenosis. Right MCA branches are within normal limits. Left MCA M1 segment and trifurcation are patent without stenosis. No left MCA branch occlusion is identified. Only mild left MCA branch irregularity noted. Venous sinuses: Patent. Anatomic variants: None. Review of the MIP images confirms the above findings IMPRESSION: 1. Occluded right CCA just beyond the origin with reconstituted right carotid bifurcation and no significant downstream stenosis despite additional atherosclerosis. 2. Short segment Critical Stenosis at the Left ICA origin - short segment RADIOGRAPHIC STRING SIGN. Only mild left ICA siphon stenosis. No left MCA occlusion or  stenosis identified. Suspect the recent MRA appearance reflected the flow limiting stenosis in the neck. 3. Bilateral Vertebral Artery V2 atherosclerosis but no significant posterior circulation stenosis. 4. Stable CT appearance of acute and chronic left MCA territory infarcts. No new intracranial abnormality. 5. Aortic Atherosclerosis (ICD10-I70.0) and Emphysema (ICD10-J43.9). Electronically Signed   By: Odessa FlemingH  Hall M.D.   On: 03/07/2020 18:52   CT ANGIO NECK W OR WO CONTRAST  Result Date: 03/07/2020 CLINICAL DATA:  59 year old male with right CCA occlusion and high-grade left ICA stenosis on Doppler ultrasound yesterday. Small left MCA territory infarct on MRI  yesterday. Severe left MCA M2 stenosis. EXAM: CT ANGIOGRAPHY HEAD AND NECK TECHNIQUE: Multidetector CT imaging of the head and neck was performed using the standard protocol during bolus administration of intravenous contrast. Multiplanar CT image reconstructions and MIPs were obtained to evaluate the vascular anatomy. Carotid stenosis measurements (when applicable) are obtained utilizing NASCET criteria, using the distal internal carotid diameter as the denominator. CONTRAST:  46mL OMNIPAQUE IOHEXOL 350 MG/ML SOLN COMPARISON:  Brain MRI and intracranial MRA yesterday. Carotid Doppler ultrasound yesterday. Head CT 03/06/2020. FINDINGS: CT HEAD Brain: Calcified atherosclerosis at the skull base. No significant change in acute on chronic left MCA territory infarcts with cytotoxic edema centered at the insula, superior left perirolandic encephalomalacia. No acute intracranial hemorrhage identified. No midline shift, mass effect, or evidence of intracranial mass lesion. No ventriculomegaly. Stable gray-white matter differentiation elsewhere. Calvarium and skull base: Negative. Paranasal sinuses: Visualized paranasal sinuses and mastoids are stable and well pneumatized. Orbits: Small right lateral scalp lipoma is stable. Other visualized orbits and scalp soft  tissues are within normal limits. CTA NECK Skeleton: Largely absent dentition. Cervical spine degeneration. No acute osseous abnormality identified. Upper chest: Emphysema. Biapical lung scarring. No superior mediastinal lymphadenopathy. Other neck: Negative. Aortic arch: 3 vessel arch configuration with moderate soft and calcified arch atherosclerosis. Right carotid system: Brachiocephalic artery soft plaque without stenosis. Occluded right CCA just beyond the origin (series 8, image 118). Reconstituted right carotid bifurcation. Superimposed calcified plaque at the right ICA origin and bulb with less than 50 % stenosis with respect to the distal vessel. Left carotid system: Left CCA origin soft and calcified plaque without stenosis. Additional soft plaque proximal to the bifurcation without stenosis. Short segment critical stenosis at the left ICA origin (series 9, image 134), short segment radiographic string sign. Additional plaque in the bulb but no other significant stenosis to the skull base. Vertebral arteries: Mild proximal right subclavian artery plaque without stenosis. No right vertebral artery origin plaque or stenosis. Right V2 calcified plaque and tortuosity without significant stenosis. Moderate proximal left subclavian artery plaque with less than 50% stenosis. Mild plaque at the left vertebral artery origin without stenosis. Tortuous left V1 segment. Codominant left vertebral with occasional plaque and tortuosity in the V2 segment but no significant stenosis to the skull base. CTA HEAD Posterior circulation: Codominant V4 segments are patent to the basilar without plaque or stenosis. Normal PICA origins. Patent basilar artery without stenosis. Normal SCA and PCA origins. Small bilateral posterior communicating arteries. Tortuous left P1 segment. Bilateral PCA branches are within normal limits. Anterior circulation: Both ICA siphons are patent. Soft and calcified plaque on the left results in mild  distal petrous and supraclinoid stenosis. Mostly calcified plaque on the right with mild if any siphon stenosis. Patent carotid termini. Normal MCA and ACA origins. Anterior communicating artery and bilateral ACA branches are within normal limits. Right MCA M1 segment and bifurcation are patent without stenosis. Right MCA branches are within normal limits. Left MCA M1 segment and trifurcation are patent without stenosis. No left MCA branch occlusion is identified. Only mild left MCA branch irregularity noted. Venous sinuses: Patent. Anatomic variants: None. Review of the MIP images confirms the above findings IMPRESSION: 1. Occluded right CCA just beyond the origin with reconstituted right carotid bifurcation and no significant downstream stenosis despite additional atherosclerosis. 2. Short segment Critical Stenosis at the Left ICA origin - short segment RADIOGRAPHIC STRING SIGN. Only mild left ICA siphon stenosis. No left MCA occlusion or stenosis identified. Suspect the recent  MRA appearance reflected the flow limiting stenosis in the neck. 3. Bilateral Vertebral Artery V2 atherosclerosis but no significant posterior circulation stenosis. 4. Stable CT appearance of acute and chronic left MCA territory infarcts. No new intracranial abnormality. 5. Aortic Atherosclerosis (ICD10-I70.0) and Emphysema (ICD10-J43.9). Electronically Signed   By: Odessa Fleming M.D.   On: 03/07/2020 18:52   US Carotid Bilateral (at [facility] and [facility] only)  Result Date: 03/07/2020 CLINICAL DATA:  CVA. History of CAD (post myocardial infarction) hypertension and smoking. History of alcohol and cocaine abuse. EXAM: BILATERAL CAROTID DUPLEX ULTRASOUND TECHNIQUE: Wallace Cullens scale imaging, color Doppler and duplex ultrasound were performed of bilateral carotid and vertebral arteries in the neck. COMPARISON:  MRA of the head-03/06/2020 FINDINGS: Criteria: Quantification of carotid stenosis is based on velocity parameters that correlate the residual internal  carotid diameter with NASCET-based stenosis levels, using the diameter of the distal internal carotid lumen as the denominator for stenosis measurement. The following velocity measurements were obtained: RIGHT ICA: 82/39 cm/sec CCA: Occluded SYSTOLIC ICA/CCA RATIO:  N/A ECA: 100 cm/sec (retrograde flow demonstrated within the right external carotid artery) LEFT ICA: 494/161 cm/sec CCA: 70/25 cm/sec SYSTOLIC ICA/CCA RATIO:  6.0 ECA: 474 cm/sec RIGHT CAROTID ARTERY: The right common carotid artery is occluded throughout its imaged course (images 4, 8, 11 and 74). There is reconstitution of flow within the carotid bulb via retrograde flow within the right external carotid artery (images 22 and 23). There is a large amount of eccentric echogenic plaque involving the proximal and mid aspects of the right internal carotid artery (image 25). RIGHT VERTEBRAL ARTERY:  Antegrade flow LEFT CAROTID ARTERY: There is a moderate amount of eccentric echogenic plaque within the left carotid bulb (image 49 and 51). There is a large amount of eccentric echogenic plaque involving the origin and proximal aspects of the left internal carotid artery (image 60, which results in markedly elevated peak systolic velocities within the proximal aspect of the left internal carotid artery and a sonographic string sign (image 62). Greatest acquired peak systolic velocity with the proximal left ICA measures 422 centimeters/second (image 63). LEFT VERTEBRAL ARTERY:  Antegrade flow IMPRESSION: 1. 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 2. Moderate to large amount of left-sided atherosclerotic plaque results in a sonographic string sign and markedly elevated peak systolic velocities within the left internal carotid artery compatible with a subtotal occlusion. Further evaluation with CTA as clinically indicated. 3. Antegrade flow  demonstrated within the bilateral vertebral arteries. These results will be called to the ordering clinician or representative by the Radiologist Assistant, and communication documented in the PACS or [employer]. Electronically Signed   By: Simonne Come M.D.   On: 03/07/2020 12:59   ECHOCARDIOGRAM COMPLETE  Result Date: 03/07/2020    ECHOCARDIOGRAM REPORT   Patient Name:   ZAKAR BROSCH Date of Exam: 03/07/2020 Medical Rec #:  [id number]         Height:       71.5 in Accession #:    [id number]        Weight:       140.0 lb Date of Birth:  28-Nov-1960         BSA:          1.821 m Patient Age:    59 years          BP:  120/77 mmHg Patient Gender: M                 HR:           84 bpm. [facility]am [facility] Procedure: 2D Echo Indications:    Stroke 434.91 / I163.9  History:        Patient has prior history of Echocardiogram examinations, most                 recent 11/19/2017. CAD and Previous Myocardial Infarction,                 Stroke; Risk Factors:Current Smoker and Hypertension. ETOH,                 ETOH.  Sonographer:    Jeryl Columbia RDCS (AE) Referring Phys: [id number] ROBERT SCHERTZ IMPRESSIONS  1. Left ventricular ejection fraction, by estimation, is 40 to 45%. The left ventricle has mildly decreased function. The left ventricle demonstrates global hypokinesis. Left ventricular diastolic parameters were normal.  2. Right ventricular systolic function is normal. The right ventricular size is normal.  3. The mitral valve is normal in structure. No evidence of mitral valve regurgitation. No evidence of mitral stenosis.  4. The aortic valve has an indeterminant number of cusps. Aortic valve regurgitation is not visualized. No aortic stenosis is present.  5. The inferior vena cava is normal in size with greater than 50% respiratory variability, suggesting right atrial pressure of 3 mmHg. FINDINGS  Left Ventricle: Left ventricular ejection fraction, by estimation, is 40 to 45%. The left  ventricle has mildly decreased function. The left ventricle demonstrates global hypokinesis. The left ventricular internal cavity size was normal in size. There is  no left ventricular hypertrophy. Left ventricular diastolic parameters were normal. Right Ventricle: The right ventricular size is normal. No increase in right ventricular wall thickness. Right ventricular systolic function is normal. Left Atrium: Left atrial size was normal in size. Right Atrium: Right atrial size was normal in size. Pericardium: There is no evidence of pericardial effusion. Mitral Valve: The mitral valve is normal in structure. No evidence of mitral valve regurgitation. No evidence of mitral valve stenosis. Tricuspid Valve: The tricuspid valve is normal in structure. Tricuspid valve regurgitation is not demonstrated. No evidence of tricuspid stenosis. Aortic Valve: The aortic valve has an indeterminant number of cusps. Aortic valve regurgitation is not visualized. No aortic stenosis is present. Aortic valve mean gradient measures 3.1 mmHg. Aortic valve peak gradient measures 5.3 mmHg. Aortic valve area, by VTI measures 2.31 cm. Pulmonic Valve: The pulmonic valve was not well visualized. Pulmonic valve regurgitation is not visualized. No evidence of pulmonic stenosis. Aorta: The aortic root is normal in size and structure. Pulmonary Artery: Indeterminant PASP, inadequate TR jet. Venous: The inferior vena cava is normal in size with greater than 50% respiratory variability, suggesting right atrial pressure of 3 mmHg. IAS/Shunts: No atrial level shunt detected by color flow Doppler.  LEFT VENTRICLE PLAX 2D LVIDd:         5.46 cm  Diastology LVIDs:         4.50 cm  LV e' medial:    8.92 cm/s LV PW:         0.82 cm  LV E/e' medial:  7.2 LV IVS:        1.01 cm  LV e' lateral:   12.10 cm/s LVOT diam:     2.20 cm  LV E/e' lateral: 5.3 LV SV:  56 LV SV Index:   31 LVOT Area:     3.80 cm  RIGHT VENTRICLE RV S prime:     10.90 cm/s TAPSE  (M-mode): 1.7 cm LEFT ATRIUM             Index       RIGHT ATRIUM          Index LA diam:        3.30 cm 1.81 cm/m  RA Area:     8.93 cm LA Vol (A2C):   30.4 ml 16.69 ml/m RA Volume:   19.00 ml 10.43 ml/m LA Vol (A4C):   23.6 ml 12.96 ml/m LA Biplane Vol: 26.8 ml 14.71 ml/m  AORTIC VALVE AV Area (Vmax):    2.51 cm AV Area (Vmean):   2.36 cm AV Area (VTI):     2.31 cm AV Vmax:           115.57 cm/s AV Vmean:          82.487 cm/s AV VTI:            0.244 m AV Peak Grad:      5.3 mmHg AV Mean Grad:      3.1 mmHg LVOT Vmax:         76.27 cm/s LVOT Vmean:        51.272 cm/s LVOT VTI:          0.148 m LVOT/AV VTI ratio: 0.61  AORTA Ao Root diam: 3.30 cm MITRAL VALVE MV Area (PHT): 3.68 cm    SHUNTS MV Decel Time: 206 msec    Systemic VTI:  0.15 m MV E velocity: 63.80 cm/s  Systemic Diam: 2.20 cm MV A velocity: 59.10 cm/s MV E/A ratio:  1.08 Dina Rich MD Electronically signed by Dina Rich MD Signature Date/Time: 03/07/2020/12:56:34 PM    Final        LOS: 2 days   Osvaldo Shipper  [facility] Pager on [email or web address]  03/09/2020, 10:23 AM

## 2020-03-09 NOTE — Evaluation (Signed)
Occupational Therapy Evaluation Patient Details Name: Craig Perry MRN: [id number] DOB: [birth year] Today's Date: 03/09/2020    History of Present Illness Pt is a 59 year old male who presented to [facility] ED with altered mental status and slurred speech. MRI revealed acute L anterior MCA infarct with severe stenosis of L ICA and complete occlusion of R carotid, and thus was transferred to [facility] for further vascular surgery eval. Plan for L CEA Friday 03/10/20. No tPA administered. NIHSS 2-0. PMH: CVA, pneumothorax, MI, HTN, hx of noncompliance with medical treatment, cocaine abuse, CHF, CAD, and alcohol abuse.   Clinical Impression   PTA Pt independent in ADL and mobility. 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 Today Pt is unsteady on feet so close min guard with gait belt for safety with transfers and in room mobility but no physical assist required. Pt was given 3 tasks to complete at the sink, and required prompts to complete all 3. He did lean on sink for balance during standing grooming tasks. He is frustrated and labile when trying to express himself. OT will continue to follow acutely, and recommend HHOT post-acute. With 3 in 1 to be used a shower chair for safety.   Of note: planned vascular sx for noon tomorrow.     Follow Up Recommendations  Supervision/Assistance - 24 hour (initially)    Equipment Recommendations  3 in 1 bedside commode (to be used a shower seat)    Recommendations for Other Services       Precautions / Restrictions Precautions Precautions: Fall Restrictions Weight Bearing Restrictions: No      Mobility Bed Mobility Overal bed mobility: Independent             General bed mobility comments: Pt able to transition to EOB independently.    Transfers Overall transfer level: Needs assistance Equipment used: None Transfers: Sit to/from [employer] Sit to Stand: Min guard Stand pivot transfers: Min guard       General transfer  comment: min guard for safety, use of gait belt    Balance Overall balance assessment: Needs assistance Sitting-balance support: No upper extremity supported;Feet unsupported Sitting balance-Leahy Scale: Good Sitting balance - Comments: able to perform sitting leans for LB dressing   Standing balance support: No upper extremity supported;During functional activity Standing balance-Leahy Scale: Fair Standing balance comment: for prolongued standing tasks leans against sink for support                           ADL either performed or assessed with clinical judgement   ADL Overall ADL's : Needs assistance/impaired     Grooming: Wash/dry hands;Wash/dry face;Minimal assistance;Standing Grooming Details (indicate cue type and reason): required assist recalling multi-step directives at sink Upper Body Bathing: Min guard;Sitting   Lower Body Bathing: Min guard;Sitting/lateral leans   Upper Body Dressing : Minimal assistance Upper Body Dressing Details (indicate cue type and reason): to don gown like robe Lower Body Dressing: Min guard;Sit to/from stand Lower Body Dressing Details (indicate cue type and reason): able to perform figure 4 to don socks Toilet Transfer: Min guard;Ambulation Toilet Transfer Details (indicate cue type and reason): close min guard for safety Toileting- Clothing Manipulation and Hygiene: Min guard   Tub/ Shower Transfer: Minimal assistance   Functional mobility during ADLs: Min guard;Minimal assistance (gait belt)       Vision   Vision Assessment?: No apparent visual deficits     Perception  Praxis      Pertinent Vitals/Pain Pain Assessment: No/denies pain     Hand Dominance Right   Extremity/Trunk Assessment Upper Extremity Assessment Upper Extremity Assessment: Generalized weakness       Cervical / Trunk Assessment Cervical / Trunk Assessment: Normal   Communication Communication Communication: Expressive difficulties  (slurred speech, aphasia)   Cognition Arousal/Alertness: Awake/alert Behavior During Therapy: Flat affect (labile) Overall Cognitive Status: Impaired/Different from baseline Area of Impairment: Orientation;Following commands;Safety/judgement;Awareness;Problem solving                 Orientation Level: Time;Situation;Place (stated "[employer]" and unable to find year)     Following Commands: Follows one step commands with increased time Safety/Judgement: Decreased awareness of safety;Decreased awareness of deficits Awareness: Emergent Problem Solving: Slow processing;Difficulty sequencing;Requires verbal cues     General Comments       Exercises     Shoulder Instructions      Home Living Family/patient expects to be discharged to:: Private residence Living Arrangements: Alone Available Help at Discharge: Friend(s);Available 24 hours/day Type of Home: Mobile home Home Access: Stairs to enter Entrance Stairs-Number of Steps: 2 Entrance Stairs-Rails: None Home Layout: One level     Bathroom Shower/Tub: None (bird bath, or bathes in "buddy's" tub)   Bathroom Toilet: Handicapped height     Home Equipment: None      Lives With: Alone    Prior Functioning/Environment Level of Independence: Independent        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 Problem List: Decreased activity tolerance;Impaired balance (sitting and/or standing);Decreased cognition;Decreased safety awareness;Decreased knowledge of precautions      OT Treatment/Interventions: Self-care/ADL training;Therapeutic activities;Patient/family education;Balance training    OT Goals(Current goals can be found in the care plan section) Acute Rehab OT Goals Patient Stated Goal: to get back to work OT Goal Formulation: With patient Time For Goal Achievement: 03/23/20 Potential to Achieve Goals: Good ADL Goals Pt Will Perform Grooming: with modified independence;standing Pt Will Perform Upper  Body Dressing: with modified independence;sitting Pt Will Perform Lower Body Dressing: with modified independence;sit to/from stand Pt Will Transfer to Toilet: with modified independence;ambulating Pt Will Perform Toileting - Clothing Manipulation and hygiene: with modified independence;sit to/from stand  OT Frequency: Min 2X/week   Barriers to D/C:            Co-evaluation              AM-PAC OT "6 Clicks" Daily Activity     Outcome Measure Help from another person eating meals?: None Help from another person taking care of personal grooming?: A Little Help from another person toileting, which includes using toliet, bedpan, or urinal?: A Little Help from another person bathing (including washing, rinsing, drying)?: A Little Help from another person to put on and taking off regular upper body clothing?: A Little Help from another person to put on and taking off regular lower body clothing?: A Little 6 Click Score: 19   End of Session Equipment Utilized During Treatment: Gait belt Nurse Communication: Mobility status  Activity Tolerance: Patient tolerated treatment well Patient left: in bed;with call bell/phone within reach;with bed alarm set  OT Visit Diagnosis: Unsteadiness on feet (R26.81);Muscle weakness (generalized) (M62.81);Other symptoms and signs involving the nervous system (R29.898);Other symptoms and signs involving cognitive function                Time: 2992-4268 OT Time Calculation (min): 20 min Charges:  OT General Charges $OT  Visit: 1 Visit OT Evaluation $OT Eval Moderate Complexity: 1 Mod  Nyoka Cowden OTR/L Acute Rehabilitation Services Pager: [phone] Office: [phone]  Evern Bio Josede Cicero 03/09/2020, 4:09 PM

## 2020-03-09 NOTE — Progress Notes (Signed)
STROKE TEAM PROGRESS NOTE   INTERVAL HISTORY His CNA is at the bedside. He is sitting up in chair and become tearful with discussing stroke dx.   Vitals:   03/08/20 2300 03/09/20 0400 03/09/20 0712 03/09/20 1113  BP: 114/63 121/67 113/71 140/88  Pulse:   81 92  Resp:   (!) 21 16  Temp: 98.1 F (36.7 C) 98.7 F (37.1 C) 97.7 F (36.5 C) 98.7 F (37.1 C)  TempSrc: Oral Oral Oral   SpO2:   99% (!) 82%  Weight:       CBC:  Recent Labs  Lab 03/06/20 1435 03/07/20 2149 03/09/20 0351  WBC 5.3  5.9 5.2 4.9  NEUTROABS 3.0  --   --   HGB 14.7  15.4 14.2 13.1  HCT 44.7  44.6 43.0 39.2  MCV 93.9  92.3 94.1 91.6  PLT 193  202 183 147*   Basic Metabolic Panel:  Recent Labs  Lab 03/07/20 2149 03/09/20 0351  NA 133* 139  K 3.8 4.0  CL 101 108  CO2 24 23  GLUCOSE 92 106*  BUN 16 12  CREATININE 0.72 0.87  CALCIUM 9.0 9.0  MG 2.3 2.1  PHOS 2.6  --    Lipid Panel:  Recent Labs  Lab 03/07/20 0526  CHOL 137  TRIG 104  HDL 36*  CHOLHDL 3.8  VLDL 21  LDLCALC 80   HgbA1c:  Recent Labs  Lab 03/07/20 0526  HGBA1C 5.7*   Urine Drug Screen:  Recent Labs  Lab 03/08/20 0752  LABOPIA NONE DETECTED  COCAINSCRNUR POSITIVE*  LABBENZ POSITIVE*  AMPHETMU NONE DETECTED  THCU NONE DETECTED  LABBARB NONE DETECTED    Alcohol Level No results for input(s): ETH in the last 168 hours.  IMAGING past 24 hours No results found.  PHYSICAL EXAM General: mild distress. Much older than stated age; disheveled, very frail and malnourished appearing. Psych: Affect appropriate to situation Eyes: No scleral injection HENT: No OP obstrucion Head: Normocephalic.  Cardiovascular: Normal rate and regular rhythm.  Respiratory: Effort normal and breath sounds normal to anterior ascultation GI: Soft.  No distension. There is no tenderness.  Skin: WDI    Neurological Examination Mental Status: Alert, oriented, thought content appropriate.  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 Able to follow 3 step commands with some difficulty. Cranial Nerves: II: Visual fields grossly normal,  III,IV, VI: ptosis not present, extra-ocular motions intact bilaterally, pupils equal, round, reactive to light and accommodation V,VII: smile symmetric, facial light touch sensation normal bilaterally VIII: hearing normal bilaterally IX,X: uvula rises symmetrically XI: bilateral shoulder shrug XII: midline tongue extension Motor: Right arm is 4+/5 with drift. Left side wnl Tone and bulk:normal tone throughout; no atrophy noted Sensory: Pinprick and light touch intact throughout, bilaterally Deep Tendon Reflexes: 2+ and symmetric throughout Cerebellar: Slow, but normal finger-to-nose, normal rapid alternating movements and normal heel-to-shin test Gait: observed pivoting and 2-3 steps to chair from bed only. He was shuffling and holding onto furniture for support w/staff  ASSESSMENT/PLAN [name] is a 59 y.o. male with history of CAD/MI s/p PCI to RCA in 2018, prev stroke, medication non-compliance and continued use of Cocaine (UDS + cocaine and benzos). He presented to [facility] with AMS and slurred speech. Transferred to [facility] for further stroke care when acute LMCA stroke found. LICA and LM2 severe stenosis and complete occlusion of RICA noted.  Stroke: LMCA infarct. Etiology likely d/t  Large vessel disease source d/t severe LICA and LM2 stenosis   MRI: Acute LMCA infarcts; Asymmetric chronic microvascular changes in LMCA with old infarct in left parietal area.   MRA: Severe LM2 MCA branch stenosis.   Carotid Doppler: Complete occlusion of RICA. Large LICA athero plaque and severe stenosis.   2D Echo: 45% EF, global hypokinesis, no LVH. LA normal.   LDL 80  HgbA1c 5.7  VTE prophylaxis - lovenox    Diet   Diet Heart Room service appropriate? Yes; Fluid consistency: Thin   Diet NPO time specified Except for:  Sips with Meds     ASA 81mg  (but pt admits non-compliant daily with this) prior to admission, now on DAPT pending Vascular recommendations post CEA plan  Therapy recommendations:  pending  Disposition:  pending  Hypertension  Home meds:  none  Stable . Permissive hypertension (OK if < 220/120) but gradually normalize in 5-7 days . Long-term BP goal normotensive  Hyperlipidemia  Home meds:  none  LDL 80, goal < 70  Lipitor 40mg  daily  Continue statin at discharge  No Diabetes   HgbA1c 5.7, goal < 7.0  CBGs  No results for input(s): GLUCAP in the last 72 hours.   SSI  Other Stroke Risk Factors  Cigarette smoker; advised to stop smoking  ETOH use, alcohol level 30, advised to stop drinking  Substance abuse - UDS: Cocaine POSITIVE. Patient advised to stop using due to stroke risk.  Hx stroke/TIA  Coronary artery disease   Other Active Problems  Substance Abuse- may need LCSW to assist with long term efforts on quitting cocaine/ETOH. 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 After hours, contact General Neurology

## 2020-03-10 ENCOUNTER — Inpatient Hospital Stay (HOSPITAL_COMMUNITY): Payer: Self-pay | Admitting: Certified Registered Nurse Anesthetist

## 2020-03-10 ENCOUNTER — Encounter (HOSPITAL_COMMUNITY): Payer: Self-pay | Admitting: Family Medicine

## 2020-03-10 ENCOUNTER — Encounter (HOSPITAL_COMMUNITY)
Admission: EM | Disposition: A | Discharge status: Home Health | Payer: Self-pay | Source: Home / Self Care | Attending: Internal Medicine

## 2020-03-10 DIAGNOSIS — I6522 Occlusion and stenosis of left carotid artery: Secondary | ICD-10-CM

## 2020-03-10 HISTORY — PX: ENDARTERECTOMY: SHX5162

## 2020-03-10 LAB — COMPREHENSIVE METABOLIC PANEL
ALT: 9 U/L (ref 0–44)
AST: 14 U/L — ABNORMAL LOW (ref 15–41)
Albumin: 3.1 g/dL — ABNORMAL LOW (ref 3.5–5.0)
Alkaline Phosphatase: 54 U/L (ref 38–126)
Anion gap: 7 (ref 5–15)
BUN: 10 mg/dL (ref 8–23)
CO2: 26 mmol/L (ref 22–32)
Calcium: 8.8 mg/dL — ABNORMAL LOW (ref 8.9–10.3)
Chloride: 105 mmol/L (ref 98–111)
Creatinine, Ser: 0.92 mg/dL (ref 0.61–1.24)
GFR, Estimated: >60 mL/min (ref >60)
Glucose, Bld: 113 mg/dL — ABNORMAL HIGH (ref 70–99)
Potassium: 4.1 mmol/L (ref 3.5–5.1)
Sodium: 138 mmol/L (ref 135–145)
Total Bilirubin: 0.7 mg/dL (ref 0.3–1.2)
Total Protein: 5.8 g/dL — ABNORMAL LOW (ref 6.5–8.1)

## 2020-03-10 LAB — CBC
HCT: 38.8 % — ABNORMAL LOW (ref 39.0–52.0)
Hemoglobin: 13 g/dL (ref 13.0–17.0)
MCH: 30.7 pg (ref 26.0–34.0)
MCHC: 33.5 g/dL (ref 30.0–36.0)
MCV: 91.5 fL (ref 80.0–100.0)
Platelets: 161 10*3/uL (ref 150–400)
RBC: 4.24 MIL/uL (ref 4.22–5.81)
RDW: 13.1 % (ref 11.5–15.5)
WBC: 5.4 10*3/uL (ref 4.0–10.5)
nRBC: 0 % (ref 0.0–0.2)

## 2020-03-10 LAB — PROTIME-INR
INR: 1.1 (ref 0.8–1.2)
Prothrombin Time: 13.5 seconds (ref 11.4–15.2)

## 2020-03-10 LAB — TYPE AND SCREEN
ABO/RH(D): O POS
Antibody Screen: NEGATIVE

## 2020-03-10 LAB — ABO/RH: ABO/RH(D): O POS

## 2020-03-10 SURGERY — ENDARTERECTOMY, CAROTID
Anesthesia: General | Site: Neck | Laterality: Left

## 2020-03-10 MED ORDER — PROPOFOL 10 MG/ML IV BOLUS
INTRAVENOUS | Status: DC | PRN
  Administered 2020-03-10: 130 mg via INTRAVENOUS

## 2020-03-10 MED ORDER — CEFAZOLIN SODIUM-DEXTROSE 2-4 GM/100ML-% IV SOLN
2.0000 g | Freq: Three times a day (TID) | INTRAVENOUS | Status: AC
  Administered 2020-03-11: 2 g via INTRAVENOUS
  Filled 2020-03-10 (×2): qty 100

## 2020-03-10 MED ORDER — LACTATED RINGERS IV SOLN: INTRAVENOUS | Status: DC

## 2020-03-10 MED ORDER — METOPROLOL TARTRATE 5 MG/5ML IV SOLN: 2.0000 mg | INTRAVENOUS | Status: DC | PRN

## 2020-03-10 MED ORDER — FENTANYL CITRATE (PF) 100 MCG/2ML IJ SOLN
25.0000 ug | INTRAMUSCULAR | Status: DC | PRN
  Administered 2020-03-10: 25 ug via INTRAVENOUS

## 2020-03-10 MED ORDER — ROCURONIUM BROMIDE 10 MG/ML (PF) SYRINGE
PREFILLED_SYRINGE | INTRAVENOUS | Status: AC
  Filled 2020-03-10: qty 20

## 2020-03-10 MED ORDER — ONDANSETRON HCL 4 MG/2ML IJ SOLN: 4.0000 mg | Freq: Four times a day (QID) | INTRAMUSCULAR | Status: DC | PRN

## 2020-03-10 MED ORDER — ACETAMINOPHEN 500 MG PO TABS: 1000.0000 mg | ORAL_TABLET | Freq: Once | ORAL | Status: AC

## 2020-03-10 MED ORDER — LABETALOL HCL 5 MG/ML IV SOLN: 10.0000 mg | INTRAVENOUS | Status: DC | PRN

## 2020-03-10 MED ORDER — SODIUM CHLORIDE 0.9 % IV SOLN: INTRAVENOUS | Status: DC

## 2020-03-10 MED ORDER — FENTANYL CITRATE (PF) 250 MCG/5ML IJ SOLN
INTRAMUSCULAR | Status: DC | PRN
  Administered 2020-03-10 (×2): 50 ug via INTRAVENOUS

## 2020-03-10 MED ORDER — AMISULPRIDE (ANTIEMETIC) 5 MG/2ML IV SOLN: 10.0000 mg | Freq: Once | INTRAVENOUS | Status: DC | PRN

## 2020-03-10 MED ORDER — LIDOCAINE 2% (20 MG/ML) 5 ML SYRINGE
INTRAMUSCULAR | Status: DC | PRN
  Administered 2020-03-10: 60 mg via INTRAVENOUS

## 2020-03-10 MED ORDER — SODIUM CHLORIDE 0.9 % IV SOLN
0.0125 ug/kg/min | Freq: Once | INTRAVENOUS | Status: AC
  Administered 2020-03-10: .15 ug/kg/min via INTRAVENOUS
  Filled 2020-03-10: qty 2000

## 2020-03-10 MED ORDER — OXYCODONE-ACETAMINOPHEN 5-325 MG PO TABS: 1.0000 | ORAL_TABLET | ORAL | Status: DC | PRN

## 2020-03-10 MED ORDER — DEXAMETHASONE SODIUM PHOSPHATE 10 MG/ML IJ SOLN
INTRAMUSCULAR | Status: AC
  Filled 2020-03-10: qty 1

## 2020-03-10 MED ORDER — MORPHINE SULFATE (PF) 2 MG/ML IV SOLN: 2.0000 mg | INTRAVENOUS | Status: DC | PRN

## 2020-03-10 MED ORDER — DEXAMETHASONE SODIUM PHOSPHATE 10 MG/ML IJ SOLN
INTRAMUSCULAR | Status: DC | PRN
  Administered 2020-03-10: 5 mg via INTRAVENOUS

## 2020-03-10 MED ORDER — PROTAMINE SULFATE 10 MG/ML IV SOLN
INTRAVENOUS | Status: AC
  Filled 2020-03-10: qty 5

## 2020-03-10 MED ORDER — ONDANSETRON HCL 4 MG/2ML IJ SOLN
INTRAMUSCULAR | Status: DC | PRN
  Administered 2020-03-10: 4 mg via INTRAVENOUS

## 2020-03-10 MED ORDER — GLYCOPYRROLATE PF 0.2 MG/ML IJ SOSY
PREFILLED_SYRINGE | INTRAMUSCULAR | Status: AC
  Filled 2020-03-10: qty 1

## 2020-03-10 MED ORDER — ALBUMIN HUMAN 5 % IV SOLN: INTRAVENOUS | Status: DC | PRN

## 2020-03-10 MED ORDER — PROTAMINE SULFATE 10 MG/ML IV SOLN
INTRAVENOUS | Status: DC | PRN
  Administered 2020-03-10: 50 mg via INTRAVENOUS

## 2020-03-10 MED ORDER — PHENYLEPHRINE 40 MCG/ML (10ML) SYRINGE FOR IV PUSH (FOR BLOOD PRESSURE SUPPORT)
PREFILLED_SYRINGE | INTRAVENOUS | Status: DC | PRN
  Administered 2020-03-10 (×2): 80 ug via INTRAVENOUS
  Administered 2020-03-10: 40 ug via INTRAVENOUS
  Administered 2020-03-10: 160 ug via INTRAVENOUS
  Administered 2020-03-10: 40 ug via INTRAVENOUS
  Administered 2020-03-10 (×2): 80 ug via INTRAVENOUS
  Administered 2020-03-10: 120 ug via INTRAVENOUS

## 2020-03-10 MED ORDER — EPHEDRINE SULFATE-NACL 50-0.9 MG/10ML-% IV SOSY
PREFILLED_SYRINGE | INTRAVENOUS | Status: DC | PRN
  Administered 2020-03-10: 10 mg via INTRAVENOUS

## 2020-03-10 MED ORDER — EPHEDRINE 5 MG/ML INJ
INTRAVENOUS | Status: AC
  Filled 2020-03-10: qty 10

## 2020-03-10 MED ORDER — LIDOCAINE HCL (PF) 2 % IJ SOLN
INTRAMUSCULAR | Status: AC
  Filled 2020-03-10: qty 5

## 2020-03-10 MED ORDER — HEPARIN SODIUM (PORCINE) 1000 UNIT/ML IJ SOLN
INTRAMUSCULAR | Status: AC
  Filled 2020-03-10: qty 2

## 2020-03-10 MED ORDER — PHENYLEPHRINE HCL-NACL 10-0.9 MG/250ML-% IV SOLN
INTRAVENOUS | Status: DC | PRN
  Administered 2020-03-10: 25 ug/min via INTRAVENOUS

## 2020-03-10 MED ORDER — SODIUM CHLORIDE 0.9 % IV SOLN: 500.0000 mL | Freq: Once | INTRAVENOUS | Status: DC | PRN

## 2020-03-10 MED ORDER — GUAIFENESIN-DM 100-10 MG/5ML PO SYRP: 15.0000 mL | ORAL_SOLUTION | ORAL | Status: DC | PRN

## 2020-03-10 MED ORDER — CHLORHEXIDINE GLUCONATE 0.12 % MT SOLN
15.0000 mL | OROMUCOSAL | Status: AC
  Administered 2020-03-10: 15 mL via OROMUCOSAL
  Filled 2020-03-10: qty 15

## 2020-03-10 MED ORDER — SODIUM CHLORIDE 0.9 % IV SOLN
INTRAVENOUS | Status: DC | PRN
  Administered 2020-03-10: 500 mL

## 2020-03-10 MED ORDER — FENTANYL CITRATE (PF) 100 MCG/2ML IJ SOLN
INTRAMUSCULAR | Status: AC
  Filled 2020-03-10: qty 2

## 2020-03-10 MED ORDER — PHENYLEPHRINE 40 MCG/ML (10ML) SYRINGE FOR IV PUSH (FOR BLOOD PRESSURE SUPPORT)
PREFILLED_SYRINGE | INTRAVENOUS | Status: AC
  Filled 2020-03-10: qty 10

## 2020-03-10 MED ORDER — GLYCOPYRROLATE PF 0.2 MG/ML IJ SOSY
PREFILLED_SYRINGE | INTRAMUSCULAR | Status: DC | PRN
  Administered 2020-03-10: .2 mg via INTRAVENOUS

## 2020-03-10 MED ORDER — ONDANSETRON HCL 4 MG/2ML IJ SOLN
INTRAMUSCULAR | Status: AC
  Filled 2020-03-10: qty 2

## 2020-03-10 MED ORDER — MIDAZOLAM HCL 2 MG/2ML IJ SOLN
INTRAMUSCULAR | Status: AC
  Filled 2020-03-10: qty 2

## 2020-03-10 MED ORDER — PHENOL 1.4 % MT LIQD: 1.0000 | OROMUCOSAL | Status: DC | PRN

## 2020-03-10 MED ORDER — LIDOCAINE HCL 1 % IJ SOLN
INTRAMUSCULAR | Status: AC
  Filled 2020-03-10: qty 20

## 2020-03-10 MED ORDER — SUCCINYLCHOLINE CHLORIDE 200 MG/10ML IV SOSY
PREFILLED_SYRINGE | INTRAVENOUS | Status: DC | PRN
  Administered 2020-03-10: 100 mg via INTRAVENOUS

## 2020-03-10 MED ORDER — HYDRALAZINE HCL 20 MG/ML IJ SOLN: 5.0000 mg | INTRAMUSCULAR | Status: DC | PRN

## 2020-03-10 MED ORDER — FENTANYL CITRATE (PF) 250 MCG/5ML IJ SOLN
INTRAMUSCULAR | Status: AC
  Filled 2020-03-10: qty 5

## 2020-03-10 MED ORDER — SODIUM CHLORIDE 0.9 % IV SOLN
INTRAVENOUS | Status: AC
  Filled 2020-03-10: qty 1.2

## 2020-03-10 MED ORDER — SUCCINYLCHOLINE CHLORIDE 200 MG/10ML IV SOSY
PREFILLED_SYRINGE | INTRAVENOUS | Status: AC
  Filled 2020-03-10: qty 10

## 2020-03-10 MED ORDER — PROPOFOL 10 MG/ML IV BOLUS
INTRAVENOUS | Status: AC
  Filled 2020-03-10: qty 20

## 2020-03-10 MED ORDER — ROCURONIUM BROMIDE 10 MG/ML (PF) SYRINGE
PREFILLED_SYRINGE | INTRAVENOUS | Status: DC | PRN
  Administered 2020-03-10: 60 mg via INTRAVENOUS
  Administered 2020-03-10: 20 mg via INTRAVENOUS

## 2020-03-10 MED ORDER — CHLORHEXIDINE GLUCONATE 0.12 % MT SOLN
15.0000 mL | OROMUCOSAL | Status: AC
  Filled 2020-03-10: qty 15

## 2020-03-10 MED ORDER — 0.9 % SODIUM CHLORIDE (POUR BTL) OPTIME
TOPICAL | Status: DC | PRN
  Administered 2020-03-10: 2000 mL

## 2020-03-10 MED ORDER — SUGAMMADEX SODIUM 200 MG/2ML IV SOLN
INTRAVENOUS | Status: DC | PRN
  Administered 2020-03-10: 200 mg via INTRAVENOUS

## 2020-03-10 MED ORDER — PANTOPRAZOLE SODIUM 40 MG PO TBEC
40.0000 mg | DELAYED_RELEASE_TABLET | Freq: Every day | ORAL | Status: DC
  Administered 2020-03-11: 12:00:00 40 mg via ORAL
  Filled 2020-03-10: qty 1

## 2020-03-10 MED ORDER — HEPARIN SODIUM (PORCINE) 1000 UNIT/ML IJ SOLN
INTRAMUSCULAR | Status: DC | PRN
  Administered 2020-03-10: 7000 [IU] via INTRAVENOUS

## 2020-03-10 MED ORDER — ACETAMINOPHEN 500 MG PO TABS
ORAL_TABLET | ORAL | Status: AC
  Administered 2020-03-10: 1000 mg via ORAL
  Filled 2020-03-10: qty 2

## 2020-03-10 SURGICAL SUPPLY — 43 items
CANISTER SUCT 3000ML PPV (MISCELLANEOUS) ×3 IMPLANT
CANNULA VESSEL 3MM 2 BLNT TIP (CANNULA) ×6 IMPLANT
CATH ROBINSON RED A/P 18FR (CATHETERS) ×3 IMPLANT
CLIP LIGATING EXTRA MED SLVR (CLIP) ×3 IMPLANT
CLIP LIGATING EXTRA SM BLUE (MISCELLANEOUS) ×3 IMPLANT
COVER WAND RF STERILE (DRAPES) ×3 IMPLANT
DECANTER SPIKE VIAL GLASS SM (MISCELLANEOUS) IMPLANT
DERMABOND ADVANCED (GAUZE/BANDAGES/DRESSINGS) ×2
DERMABOND ADVANCED .7 DNX12 (GAUZE/BANDAGES/DRESSINGS) ×1 IMPLANT
DRAIN HEMOVAC 1/8 X 5 (WOUND CARE) IMPLANT
ELECT REM PT RETURN 9FT ADLT (ELECTROSURGICAL) ×3
ELECTRODE REM PT RTRN 9FT ADLT (ELECTROSURGICAL) ×1 IMPLANT
EVACUATOR SILICONE 100CC (DRAIN) IMPLANT
GLOVE BIO SURGEON STRL SZ7.5 (GLOVE) ×2 IMPLANT
GLOVE SKINSENSE NS SZ6.5 (GLOVE) ×4
GLOVE SKINSENSE NS SZ7.0 (GLOVE) ×4
GLOVE SKINSENSE STRL SZ6.5 (GLOVE) IMPLANT
GLOVE SKINSENSE STRL SZ7.0 (GLOVE) IMPLANT
GLOVE SS BIOGEL STRL SZ 7.5 (GLOVE) ×1 IMPLANT
GLOVE SUPERSENSE BIOGEL SZ 7.5 (GLOVE) ×2
GOWN STRL REUS W/ TWL LRG LVL3 (GOWN DISPOSABLE) ×3 IMPLANT
GOWN STRL REUS W/TWL LRG LVL3 (GOWN DISPOSABLE) ×8
KIT BASIN OR (CUSTOM PROCEDURE TRAY) ×3 IMPLANT
KIT SHUNT ARGYLE CAROTID ART 6 (VASCULAR PRODUCTS) IMPLANT
KIT TURNOVER KIT B (KITS) ×3 IMPLANT
NEEDLE 22X1 1/2 (OR ONLY) (NEEDLE) IMPLANT
NS IRRIG 1000ML POUR BTL (IV SOLUTION) ×6 IMPLANT
PACK CAROTID (CUSTOM PROCEDURE TRAY) ×3 IMPLANT
PAD ARMBOARD 7.5X6 YLW CONV (MISCELLANEOUS) ×6 IMPLANT
PATCH HEMASHIELD 8X75 (Vascular Products) ×2 IMPLANT
POSITIONER HEAD DONUT 9IN (MISCELLANEOUS) ×3 IMPLANT
SHUNT CAROTID BYPASS 10 (VASCULAR PRODUCTS) IMPLANT
SHUNT CAROTID BYPASS 12FRX15.5 (VASCULAR PRODUCTS) IMPLANT
SUT ETHILON 3 0 PS 1 (SUTURE) IMPLANT
SUT PROLENE 6 0 CC (SUTURE) ×7 IMPLANT
SUT SILK 3 0 (SUTURE)
SUT SILK 3-0 18XBRD TIE 12 (SUTURE) IMPLANT
SUT VIC AB 3-0 SH 27 (SUTURE) ×4
SUT VIC AB 3-0 SH 27X BRD (SUTURE) ×2 IMPLANT
SUT VICRYL 4-0 PS2 18IN ABS (SUTURE) ×3 IMPLANT
SYR CONTROL 10ML LL (SYRINGE) IMPLANT
TOWEL GREEN STERILE (TOWEL DISPOSABLE) ×3 IMPLANT
WATER STERILE IRR 1000ML POUR (IV SOLUTION) ×3 IMPLANT

## 2020-03-10 NOTE — Interval H&P Note (Signed)
History and Physical Interval Note:  03/10/2020 11:41 AM  Craig Perry  has presented today for surgery, with the diagnosis of Left Carotid Artery Stenosis.  The various methods of treatment have been discussed with the patient and family. After consideration of risks, benefits and other options for treatment, the patient has consented to  Procedure(s): LEFT CAROTID ENDARTERECTOMY (Left) as a surgical intervention.  The patient's history has been reviewed, patient examined, no change in status, stable for surgery.  I have reviewed the patient's chart and labs.  Questions were answered to the patient's satisfaction.     Gretta Began

## 2020-03-10 NOTE — Transfer of Care (Signed)
Immediate Anesthesia Transfer of Care Note  Patient: Craig Perry  Procedure(s) Performed: LEFT CAROTID ENDARTERECTOMY WITH PATCH ANGIOPLASTY (Left Neck)  Patient Location: PACU  Anesthesia Type:General  Level of Consciousness: awake, alert , oriented and patient cooperative  Airway & Oxygen Therapy: Patient Spontanous Breathing and Patient connected to face mask oxygen  Post-op Assessment: Report given to RN, Post -op Vital signs reviewed and stable and Patient moving all extremities X 4  Post vital signs: Reviewed and stable  Last Vitals:  Vitals Value Taken Time  BP 128/79 03/10/20 1457  Temp 36.6 C 03/10/20 1457  Pulse 93 03/10/20 1500  Resp 29 03/10/20 1500  SpO2 100 % 03/10/20 1500  Vitals shown include unvalidated device data.  Last Pain:  Vitals:   03/10/20 0802  TempSrc: Oral  PainSc:          Complications: No complications documented.

## 2020-03-10 NOTE — Progress Notes (Signed)
Patient received into 4E12 from PACU. CHG and skin assessment completed. Vital signs ans neuro assessment done and charted. No patient complaints at this time. Will continue to monitor.  -Elissa Lovett, RN

## 2020-03-10 NOTE — Anesthesia Postprocedure Evaluation (Signed)
Anesthesia Post Note  Patient: KAIMANA LURZ  Procedure(s) Performed: LEFT CAROTID ENDARTERECTOMY WITH PATCH ANGIOPLASTY (Left Neck)     Patient location during evaluation: PACU Anesthesia Type: General Level of consciousness: awake and alert Pain management: pain level controlled Vital Signs Assessment: post-procedure vital signs reviewed and stable Respiratory status: spontaneous breathing, nonlabored ventilation, respiratory function stable and patient connected to nasal cannula oxygen Cardiovascular status: blood pressure returned to baseline and stable Postop Assessment: no apparent nausea or vomiting Anesthetic complications: no   No complications documented.  Last Vitals:  Vitals:   03/10/20 1515 03/10/20 1530  BP: 113/70 114/72  Pulse: 92 89  Resp: 15 16  Temp:  36.6 C  SpO2: 92% 94%    Last Pain:  Vitals:   03/10/20 1530  TempSrc:   PainSc: 4                  Kennieth Rad

## 2020-03-10 NOTE — Discharge Instructions (Signed)
   [facility]  Discharge Instructions   Carotid Endarterectomy (CEA)  Please refer to the following instructions for your post-procedure care. Your surgeon or physician assistant will discuss any changes with you.  Activity  You are encouraged to walk as much as you can. You can slowly return to normal activities but must avoid strenuous activity and heavy lifting until your doctor tell you it's OK. Avoid activities such as vacuuming or swinging a golf club. You can drive after one week if you are comfortable and you are no longer taking prescription pain medications. It is normal to feel tired for serval weeks after your surgery. It is also normal to have difficulty with sleep habits, eating, and bowel movements after surgery. These will go away with time.  Bathing/Showering  You may shower after you come home. Do not soak in a bathtub, hot tub, or swim until the incision heals completely.  Incision Care  Shower every day. Clean your incision with mild soap and water. Pat the area dry with a clean towel. You do not need a bandage unless otherwise instructed. Do not apply any ointments or creams to your incision. You may have skin glue on your incision. Do not peel it off. It will come off on its own in about one week. Your incision may feel thickened and raised for several weeks after your surgery. This is normal and the skin will soften over time. For Men Only: It's OK to shave around the incision but do not shave the incision itself for 2 weeks. It is common to have numbness under your chin that could last for several months.  Diet  Resume your normal diet. There are no special food restrictions following this procedure. A low fat/low cholesterol diet is recommended for all patients with vascular disease. In order to heal from your surgery, it is CRITICAL to get adequate nutrition. Your body requires vitamins, minerals, and protein. Vegetables are the best  source of vitamins and minerals. Vegetables also provide the perfect balance of protein. Processed food has little nutritional value, so try to avoid this.        Medications  Resume taking all of your medications unless your doctor or physician assistant tells you not to. If your incision is causing pain, you may take over-the- counter pain relievers such as acetaminophen (Tylenol). If you were prescribed a stronger pain medication, please be aware these medications can cause nausea and constipation. Prevent nausea by taking the medication with a snack or meal. Avoid constipation by drinking plenty of fluids and eating foods with a high amount of fiber, such as fruits, vegetables, and grains. Do not take Tylenol if you are taking prescription pain medications.  Follow Up  Our office will schedule a follow up appointment 2-3 weeks following discharge.  Please call us immediately for any of the following conditions  Increased pain, redness, drainage (pus) from your incision site. Fever of 101 degrees or higher. If you should develop stroke (slurred speech, difficulty swallowing, weakness on one side of your body, loss of vision) you should call 911 and go to the nearest emergency room.  Reduce your risk of vascular disease:  Stop smoking. If you would like help call [facility] at 1-800-QUIT-NOW (1-800-784-8669) or [place] at 336-586-4000. Manage your cholesterol Maintain a desired weight Control your diabetes Keep your blood pressure down  If you have any questions, please call the office at [phone].   

## 2020-03-10 NOTE — Anesthesia Preprocedure Evaluation (Signed)
Anesthesia Evaluation  Patient identified by MRN, date of birth, ID band Patient awake    Reviewed: Allergy & Precautions, NPO status , Patient's Chart, lab work & pertinent test results  Airway Mallampati: II  TM Distance: >3 FB Neck ROM: Full    Dental  (+) Dental Advisory Given   Pulmonary Current Smoker,    breath sounds clear to auscultation       Cardiovascular hypertension, Pt. on medications and Pt. on home beta blockers + angina + CAD, + Past MI, + Cardiac Stents and +CHF   Rhythm:Regular Rate:Normal     Neuro/Psych CVA    GI/Hepatic negative GI ROS, Neg liver ROS, (+)     substance abuse  cocaine use,   Endo/Other  negative endocrine ROS  Renal/GU negative Renal ROS     Musculoskeletal   Abdominal   Peds  Hematology negative hematology ROS (+)   Anesthesia Other Findings   Reproductive/Obstetrics                             Anesthesia Physical Anesthesia Plan  ASA: IV  Anesthesia Plan: General   Post-op Pain Management:    Induction: Intravenous  PONV Risk Score and Plan: 1 and Dexamethasone, Ondansetron and Treatment may vary due to age or medical condition  Airway Management Planned: Oral ETT  Additional Equipment: Arterial line  Intra-op Plan:   Post-operative Plan: Extubation in OR  Informed Consent: I have reviewed the patients History and Physical, chart, labs and discussed the procedure including the risks, benefits and alternatives for the proposed anesthesia with the patient or authorized representative who has indicated his/her understanding and acceptance.     Dental advisory given  Plan Discussed with: CRNA  Anesthesia Plan Comments:         Anesthesia Quick Evaluation

## 2020-03-10 NOTE — Anesthesia Procedure Notes (Signed)
Procedure Name: Intubation Date/Time: 03/10/2020 12:37 PM Performed by: Waynard Edwards, CRNA Pre-anesthesia Checklist: Patient identified, Emergency Drugs available, Suction available and Patient being monitored Patient Re-evaluated:Patient Re-evaluated prior to induction Oxygen Delivery Method: Circle system utilized Preoxygenation: Pre-oxygenation with 100% oxygen Induction Type: IV induction and Rapid sequence Laryngoscope Size: Miller and 3 Grade View: Grade I Tube type: Oral Tube size: 7.5 mm Number of attempts: 1 Airway Equipment and Method: Stylet Placement Confirmation: ETT inserted through vocal cords under direct vision,  positive ETCO2 and breath sounds checked- equal and bilateral Secured at: 23 cm Tube secured with: Tape Dental Injury: Teeth and Oropharynx as per pre-operative assessment

## 2020-03-10 NOTE — Progress Notes (Signed)
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  He was found to have an acute left MCA infarct.  Patient was initially admitted to any pain hospital.  Work-up revealed critical stenosis in the left carotid artery.  He was subsequently transferred here for vascular input.  Reason for Visit: Acute stroke.  Consultants: Vascular surgery.  Neurology  Procedures: Left carotid endarterectomy is planned for today  Antibiotics: Anti-infectives (From admission, onward)   Start     Dose/Rate Route Frequency Ordered Stop   03/10/20 1145  cefUROXime (ZINACEF) 1.5 g in sodium chloride 0.9 % 100 mL IVPB        1.5 g 200 mL/hr over 30 Minutes Intravenous To [facility] 03/09/20 0750 03/11/20 1145      Subjective/Interval History: Denies any shortness of breath or chest pain this morning.  Denies any weakness on any one side of his body.  No headaches.  Looking forward to the surgery.    Assessment/Plan:  Acute left MCA territory infarct/critical stenosis of left ICA Patient presented with slurred speech and altered mental status.  Imaging studies revealed left MCA infarct.   Patient was started on aspirin and Plavix.  To determine long-term antiplatelet treatment plan after surgery. LDL 80.  Continue statin.  HbA1c 5.7.   Urine drug screen positive for cocaine. PT and OT evaluation. CT angiogram showed critical stenosis in the left ICA.  Discussed with vascular surgery.  Plan is for carotid endarterectomy today. Echocardiogram shows EF of 40 to 45%. Neurology is  following.  History of coronary artery disease  Patient has history of NSTEMI previously.  He has two-vessel disease with PCI to RCA in 2018.   Continue antiplatelet agents and statin.  Also on beta-blocker. Remains stable from a cardiac standpoint.  History of cardiomyopathy thought to be secondary to alcohol Previously had a EF of 35% back in 2019.  Echocardiogram from this hospitalization shows EF of 40 to 45% with global diffuse hypokinesis.  Patient previously unable to take ACE inhibitor or ARB due to soft blood pressure.  Follow-up with cardiology in the outpatient setting.  History of cocaine abuse Urine drug screen positive for cocaine.  Patient was counseled.  History of tobacco abuse Nicotine patch.  Counseled.  History of alcohol abuse Drinks at least 5-6 beers on a daily basis.  Placed on CIWA protocol.  Given thiamine and folic acid.  No evidence for withdrawal currently.    DVT Prophylaxis: Lovenox Code Status: Full code Family Communication: Discussed with the patient.  No family at bedside Disposition Plan: Hopefully return home on Saturday  Status is: Inpatient  Remains inpatient appropriate because:Ongoing diagnostic testing needed not appropriate for outpatient work up, IV treatments appropriate due to intensity of illness or inability to take PO and Inpatient level of care appropriate due to severity of illness   Dispo: The patient is from: Home              Anticipated d/c is to: Home              Anticipated d/c date is: 2 days  Patient currently is not medically stable to d/c.      Medications:  Scheduled: . aspirin  81 mg Oral Daily  . atorvastatin  40 mg Oral Daily  . carvedilol  3.125 mg Oral BID  . clopidogrel  75 mg Oral Daily  . enoxaparin (LOVENOX) injection  40 mg Subcutaneous Q24H  . FLUoxetine  20 mg Oral Daily  . folic acid  1 mg Oral Daily  . LORazepam  0-4 mg Intravenous Q12H  . multivitamin with minerals  1 tablet Oral  Daily  . mupirocin ointment  1 application Nasal BID  . nicotine  21 mg Transdermal Daily  . thiamine  100 mg Oral Daily   Or  . thiamine  100 mg Intravenous Daily   Continuous: . sodium chloride 100 mL/hr (03/08/20 2237)  . cefUROXime (ZINACEF)  IV     EGB:[id number] **OR** acetaminophen (TYLENOL) oral liquid 160 mg/5 mL **OR** acetaminophen, LORazepam **OR** LORazepam, senna-docusate   Objective:  Vital Signs  Vitals:   03/09/20 2214 03/10/20 0137 03/10/20 0429 03/10/20 0802  BP: (!) 136/94 128/86 (!) 128/103 127/73  Pulse: 88 87 86 79  Resp: (!) 21 19 20 17   Temp: 98 F (36.7 C) (!) 97.5 F (36.4 C) 98 F (36.7 C) 97.9 F (36.6 C)  TempSrc: Oral Oral Oral Oral  SpO2: 94% 96% 96% 98%  Weight:        Intake/Output Summary (Last 24 hours) at 03/10/2020 0944 Last data filed at 03/10/2020 0802 Gross per 24 hour  Intake 200 ml  Output 2075 ml  Net -1875 ml   Filed Weights   03/06/20 2306 03/08/20 0334  Weight: 63.5 kg 65.4 kg    General appearance: Awake alert.  In no distress Resp: Clear to auscultation bilaterally.  Normal effort Cardio: S1-S2 is normal regular.  No S3-S4.  No rubs murmurs or bruit GI: Abdomen is soft.  Nontender nondistended.  Bowel sounds are present normal.  No masses organomegaly Extremities: No edema.  Full range of motion of lower extremities. Neurologic:   No focal neurological deficits.     Lab Results:  Data Reviewed: I have personally reviewed following labs and imaging studies  CBC: Recent Labs  Lab 03/06/20 1435 03/07/20 2149 03/09/20 0351 03/10/20 0339  WBC 5.3  5.9 5.2 4.9 5.4  NEUTROABS 3.0  --   --   --   HGB 14.7  15.4 14.2 13.1 13.0  HCT 44.7  44.6 43.0 39.2 38.8*  MCV 93.9  92.3 94.1 91.6 91.5  PLT 193  202 183 147* 161    Basic Metabolic Panel: Recent Labs  Lab 03/06/20 1435 03/07/20 2149 03/09/20 0351 03/10/20 0339  NA 136 133* 139 138  K 4.1 3.8 4.0 4.1  CL 101 101 108 105  CO2 25 24 23 26    GLUCOSE 121* 92 106* 113*  BUN 22 16 12 10   CREATININE 0.93 0.72 0.87 0.92  CALCIUM 9.5 9.0 9.0 8.8*  MG  --  2.3 2.1  --   PHOS  --  2.6  --   --     GFR: Estimated Creatinine Clearance: 78 mL/min (by C-G formula based on SCr of 0.92 mg/dL).  Liver Function Tests: Recent Labs  Lab 03/06/20 1435 03/07/20 2149 03/09/20 0351 03/10/20 0339  AST 18 15 13* 14*  ALT 10 9 9 9   ALKPHOS 67 62 53 54  BILITOT 0.6 0.6 0.6 0.7  PROT 7.6 6.8 5.6* 5.8*  ALBUMIN 4.2 3.8 3.1* 3.1*  Coagulation Profile: Recent Labs  Lab 03/06/20 1630 03/10/20 0339  INR 1.0 1.1    HbA1C: No results for input(s): HGBA1C in the last 72 hours.  CBG: Recent Labs  Lab 03/06/20 1419  GLUCAP 123*     Recent Results (from the past 240 hour(s))  Resp Panel by RT-PCR (Flu A&B, Covid) Nasopharyngeal Swab     Status: None   Collection Time: 03/06/20  5:16 PM   Specimen: Nasopharyngeal Swab; Nasopharyngeal(NP) swabs in vial transport medium  Result Value Ref Range Status   SARS Coronavirus 2 by RT PCR NEGATIVE NEGATIVE Final    Comment: (NOTE) SARS-CoV-2 target nucleic acids are NOT DETECTED.  The SARS-CoV-2 RNA is generally detectable in upper respiratory specimens during the acute phase of infection. The lowest concentration of SARS-CoV-2 viral copies this assay can detect is 138 copies/mL. A negative result does not preclude SARS-Cov-2 infection and should not be used as the sole basis for treatment or other patient management decisions. A negative result may occur with  improper specimen collection/handling, submission of specimen other than nasopharyngeal swab, presence of viral mutation(s) within the areas targeted by this assay, and inadequate number of viral copies(<138 copies/mL). A negative result must be combined with clinical observations, patient history, and epidemiological information. The expected result is Negative.  Fact Sheet for Patients:   BloggerCourse.com  Fact Sheet for Healthcare Providers:  SeriousBroker.it  This test is no t yet approved or cleared by the [place] FDA and  has been authorized for detection and/or diagnosis of SARS-CoV-2 by FDA under an Emergency Use Authorization (EUA). This EUA will remain  in effect (meaning this test can be used) for the duration of the COVID-19 declaration under Section 564(b)(1) of the Act, 21 U.S.C.section 360bbb-3(b)(1), unless the authorization is terminated  or revoked sooner.       Influenza A by PCR NEGATIVE NEGATIVE Final   Influenza B by PCR NEGATIVE NEGATIVE Final    Comment: (NOTE) The Xpert Xpress SARS-CoV-2/FLU/RSV plus assay is intended as an aid in the diagnosis of influenza from Nasopharyngeal swab specimens and should not be used as a sole basis for treatment. Nasal washings and aspirates are unacceptable for Xpert Xpress SARS-CoV-2/FLU/RSV testing.  Fact Sheet for Patients: BloggerCourse.com  Fact Sheet for Healthcare Providers: SeriousBroker.it  This test is not yet approved or cleared by the [place] FDA and has been authorized for detection and/or diagnosis of SARS-CoV-2 by FDA under an Emergency Use Authorization (EUA). This EUA will remain in effect (meaning this test can be used) for the duration of the COVID-19 declaration under Section 564(b)(1) of the Act, 21 U.S.C. section 360bbb-3(b)(1), unless the authorization is terminated or revoked.  Performed at [facility], [address] [place], [place] [address]   Surgical PCR screen     Status: None   Collection Time: 03/09/20  3:30 PM   Specimen: Nasal Mucosa; Nasal Swab  Result Value Ref Range Status   MRSA, PCR NEGATIVE NEGATIVE Final   Staphylococcus aureus NEGATIVE NEGATIVE Final    Comment: (NOTE) The Xpert SA Assay (FDA approved for NASAL specimens in patients 22 years of  age and older), is one component of a comprehensive surveillance program. It is not intended to diagnose infection nor to guide or monitor treatment. Performed at [facility], [address], [place], [place] [address]       Radiology Studies: No results found.     LOS: 3 days   [employer]  [facility] Pager on [email or web address]  03/10/2020, 9:44 AM

## 2020-03-10 NOTE — Op Note (Signed)
   OPERATIVE REPORT  DATE OF SURGERY: 03/10/2020  PATIENT: Craig Perry, 59 y.o. male MRN: [id number]  DOB: 20-May-1960  PRE-OPERATIVE DIAGNOSIS: Left Carotid Stenosis, Symptomatic  POST-OPERATIVE DIAGNOSIS:  Same  PROCEDURE:  Left Carotid Endarterectomy with Dacron Patch Angioplasty  SURGEON:  Gretta Began, M.D.  PHYSICIAN ASSISTANT: Eveland PA-C  The assistant was needed for exposure and to expedite the case  ANESTHESIA:   general  EBL: Less than 200 ml  Total I/O In: 1750 [I.V.:1100; Other:50; IV Piggyback:600] Out: 550 [Urine:400; Blood:150]  BLOOD ADMINISTERED: none  DRAINS: none   SPECIMEN: none  COUNTS CORRECT:  YES  PLAN OF CARE: Admit to inpatient   PATIENT DISPOSITION:  PACU - hemodynamically stable and neurologically intact.  PROCEDURE DETAILS: The patient was taken to the operating room placed in supine position.  General anesthesia was administered.  The neck was prepped and draped in the usual sterile fashion.  An incision was made anterior to the sternocleidomastoid and carried down through the platysma with electrocautery.  The sternocleidomastoid was reflected posteriorly and the carotid sheath was opened.  The facial vein was ligated with 2-0 silk ties and divided.  The common carotid artery was encircled with an umbilical tape and Rummel tourniquet.  The vagus nerve was identified and preserved.  Dissection was continued onto the carotid bifurcation.  The superior thyroid artery was encircled with a 2-0 silk Potts tie.  The external carotid was encircled with a blue vessel loop and the internal carotid was encircled with an umbilical tape and Rummel tourniquet.  The hypoglossal nerve was identified and preserved.  The patient was given systemic heparin and after adequate circulation time, the internal, external and common carotid arteries were occluded with vascular clamps.  The common carotid artery was opened with an 11 blade and extended  longitudinally  with Potts scissors.  A 10 shunt was passed up the internal carotid and allowed to backbleed.  It was then passed down the common carotid where it was secured with Rummel tourniquet.  The endarterectomy was begun on the common carotid artery and the plaque was divided proximally with Potts scissors.  The endarterectomy was continued onto the bifurcation.  The external carotid was endarterectomized with an eversion technique and the internal carotid was endarterectomized in an open fashion.  Remaining atheromatous debris was removed from the endarterectomy plane.  A Finesse Hemashield Dacron patch was brought onto the field and was sewn as a patch angioplasty with a running 6-0 Prolene suture.  Prior to completion of the closure the shunt was removed and the usual flushing maneuvers were undertaken.  The anastomosis was completed and flow was restored first to the external and then the internal carotid artery.  Excellent flow characteristics were noted with hand-held Doppler in the internal and external carotid arteries.  The patient was given 50 mg of protamine to reverse the heparin.  The wounds were irrigated with saline.  Hemostasis was obtained with electrocautery.  The wounds were closed with 3-0 Vicryl to reapproximate the sternocleidomastoid over the carotid sheath.  The platysma was lysed with a running 3-0 Vicryl suture.  The skin was closed with a 4-0 subcuticular Vicryl stitch.  Dermabond was applied.  The patient was awakened neurologically intact in the operating room and transferred to the recovery room in stable condition   Gretta Began, M.D. 03/10/2020 3:44 PM

## 2020-03-10 NOTE — Progress Notes (Signed)
PT Cancellation Note  Patient Details Name: Craig Perry MRN: [id number] DOB: [birth year]   Cancelled Treatment:    Reason Eval/Treat Not Completed: Patient at procedure or test/unavailable Pt receiving surgery. RN stated that she did not believe pt would be appropriate for PT treatment following surgery this date. Will follow-up as able.  Raymond Gurney, PT, DPT Acute Rehabilitation Services  Pager: [phone] Office: [phone]   [name] 03/10/2020, 3:40 PM

## 2020-03-11 DIAGNOSIS — I639 Cerebral infarction, unspecified: Secondary | ICD-10-CM

## 2020-03-11 DIAGNOSIS — I63232 Cerebral infarction due to unspecified occlusion or stenosis of left carotid arteries: Secondary | ICD-10-CM

## 2020-03-11 LAB — CBC
HCT: 31.4 % — ABNORMAL LOW (ref 39.0–52.0)
Hemoglobin: 11.2 g/dL — ABNORMAL LOW (ref 13.0–17.0)
MCH: 32.5 pg (ref 26.0–34.0)
MCHC: 35.7 g/dL (ref 30.0–36.0)
MCV: 91 fL (ref 80.0–100.0)
Platelets: 150 10*3/uL (ref 150–400)
RBC: 3.45 MIL/uL — ABNORMAL LOW (ref 4.22–5.81)
RDW: 13.2 % (ref 11.5–15.5)
WBC: 6.7 10*3/uL (ref 4.0–10.5)
nRBC: 0 % (ref 0.0–0.2)

## 2020-03-11 LAB — COMPREHENSIVE METABOLIC PANEL
ALT: 9 U/L (ref 0–44)
AST: 16 U/L (ref 15–41)
Albumin: 3.1 g/dL — ABNORMAL LOW (ref 3.5–5.0)
Alkaline Phosphatase: 44 U/L (ref 38–126)
Anion gap: 8 (ref 5–15)
BUN: 14 mg/dL (ref 8–23)
CO2: 23 mmol/L (ref 22–32)
Calcium: 8.8 mg/dL — ABNORMAL LOW (ref 8.9–10.3)
Chloride: 102 mmol/L (ref 98–111)
Creatinine, Ser: 0.93 mg/dL (ref 0.61–1.24)
GFR, Estimated: >60 mL/min (ref >60)
Glucose, Bld: 181 mg/dL — ABNORMAL HIGH (ref 70–99)
Potassium: 4 mmol/L (ref 3.5–5.1)
Sodium: 133 mmol/L — ABNORMAL LOW (ref 135–145)
Total Bilirubin: 0.4 mg/dL (ref 0.3–1.2)
Total Protein: 5.1 g/dL — ABNORMAL LOW (ref 6.5–8.1)

## 2020-03-11 MED ORDER — ADULT MULTIVITAMIN W/MINERALS CH: 1.0000 | ORAL_TABLET | Freq: Every day | ORAL | Status: DC

## 2020-03-11 MED ORDER — ASPIRIN EC 81 MG PO TBEC: 81.0000 mg | DELAYED_RELEASE_TABLET | Freq: Every day | ORAL | 2 refills | Status: AC

## 2020-03-11 MED ORDER — CARVEDILOL 3.125 MG PO TABS
3.1250 mg | ORAL_TABLET | Freq: Two times a day (BID) | ORAL | 2 refills | Status: DC
Start: 2020-03-11 — End: 2021-03-19

## 2020-03-11 MED ORDER — THIAMINE HCL 100 MG PO TABS: 100.0000 mg | ORAL_TABLET | Freq: Every day | ORAL | 0 refills | Status: DC

## 2020-03-11 MED ORDER — ATORVASTATIN CALCIUM 40 MG PO TABS
40.0000 mg | ORAL_TABLET | Freq: Every day | ORAL | 2 refills | Status: DC
Start: 2020-03-11 — End: 2021-03-19

## 2020-03-11 MED ORDER — FLUOXETINE HCL 20 MG PO CAPS: 20.0000 mg | ORAL_CAPSULE | Freq: Every day | ORAL | 0 refills | Status: DC

## 2020-03-11 MED ORDER — CLOPIDOGREL BISULFATE 75 MG PO TABS: 75.0000 mg | ORAL_TABLET | Freq: Every day | ORAL | 0 refills | Status: AC

## 2020-03-11 NOTE — Discharge Summary (Signed)
[facility]  Physician Discharge Summary   Patient ID: Craig Perry MRN: [id number] DOB/AGE: Oct 24, 1960 59 y.o.  Admit date: 03/06/2020 Discharge date: 03/11/2020  PCP: Patient, No Pcp Per  DISCHARGE DIAGNOSES:  Acute CVA Carotid artery stenosis status post CEA Coronary artery disease History of cardiomyopathy History of cocaine, tobacco and alcohol abuse  RECOMMENDATIONS FOR OUTPATIENT FOLLOW UP: 1. Outpatient follow-up with vascular surgery 2. Ambulatory referral sent to neurology.    Home Health: Patient without insurance.  Not interested.   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  He was found to have an acute left MCA infarct.  Patient was initially admitted to any pain hospital.  Work-up revealed critical stenosis in the left carotid artery.  He was subsequently transferred here for vascular input.  Consultants: Vascular surgery.  Neurology  Procedures: Left carotid endarterectomy   HOSPITAL COURSE:    Acute left MCA territory infarct/critical stenosis of left ICA Patient presented with slurred speech and altered mental status.  Imaging studies revealed left MCA infarct.   Patient was started on aspirin and Plavix.  LDL 80.  Continue statin.  HbA1c 5.7.   Urine drug screen positive for cocaine. CT angiogram showed critical stenosis in the left ICA.    Patient underwent carotid endarterectomy on 12/10.   Echocardiogram shows EF of 40 to 45%. Patient seen by the stroke service.  Plan for aspirin and Plavix for 3 weeks followed by aspirin alone. Seen by PT and OT.   Outpatient physical therapy recommended.  Patient without any insurance.  Not interested.  History of coronary artery disease  Patient has history of NSTEMI previously.  He has two-vessel disease with PCI to RCA in 2018.  Continue statin, antiplatelet agent as well as beta-blocker. Remains stable from a cardiac standpoint.  History of cardiomyopathy thought to be secondary to alcohol Previously had a EF of 35% back in 2019.  Echocardiogram from this hospitalization shows EF of 40 to 45% with global diffuse hypokinesis.  Patient previously unable to take ACE inhibitor or ARB due to soft blood pressure.    History of cocaine abuse Urine drug screen positive for cocaine.  Patient was counseled.  History of tobacco abuse Nicotine patch.  Counseled.  History of alcohol abuse Drinks at least 5-6 beers on a daily basis.  Placed on CIWA protocol.  Given thiamine and folic acid.  No evidence for withdrawal currently.    Overall stable.  Okay for discharge home today.   PERTINENT LABS:  The results of significant diagnostics from this hospitalization (including imaging, microbiology, ancillary and laboratory) are listed below for reference.    Microbiology: Recent Results (from the past 240 hour(s))  Resp Panel by RT-PCR (Flu A&B, Covid) Nasopharyngeal Swab     Status: None   Collection Time: 03/06/20  5:16 PM   Specimen: Nasopharyngeal Swab; Nasopharyngeal(NP) swabs in vial transport medium  Result Value Ref Range Status   SARS Coronavirus 2 by RT PCR NEGATIVE NEGATIVE Final    Comment: (NOTE) SARS-CoV-2 target nucleic acids are NOT DETECTED.  The SARS-CoV-2 RNA is generally detectable in upper respiratory specimens during the acute phase of infection. The lowest concentration of SARS-CoV-2 viral  copies this assay can detect is 138 copies/mL. A negative result does not preclude SARS-Cov-2 infection and should not be used as the sole basis for treatment or other patient  management decisions. A negative result may occur with  improper specimen collection/handling, submission of specimen other than nasopharyngeal swab, presence of viral mutation(s) within the areas targeted by this assay, and inadequate number of viral copies(<138 copies/mL). A negative result must be combined with clinical observations, patient history, and epidemiological information. The expected result is Negative.  Fact Sheet for Patients:  BloggerCourse.com  Fact Sheet for Healthcare Providers:  SeriousBroker.it  This test is no t yet approved or cleared by the [place] FDA and  has been authorized for detection and/or diagnosis of SARS-CoV-2 by FDA under an Emergency Use Authorization (EUA). This EUA will remain  in effect (meaning this test can be used) for the duration of the COVID-19 declaration under Section 564(b)(1) of the Act, 21 U.S.C.section 360bbb-3(b)(1), unless the authorization is terminated  or revoked sooner.       Influenza A by PCR NEGATIVE NEGATIVE Final   Influenza B by PCR NEGATIVE NEGATIVE Final    Comment: (NOTE) The Xpert Xpress SARS-CoV-2/FLU/RSV plus assay is intended as an aid in the diagnosis of influenza from Nasopharyngeal swab specimens and should not be used as a sole basis for treatment. Nasal washings and aspirates are unacceptable for Xpert Xpress SARS-CoV-2/FLU/RSV testing.  Fact Sheet for Patients: BloggerCourse.com  Fact Sheet for Healthcare Providers: SeriousBroker.it  This test is not yet approved or cleared by the [place] FDA and has been authorized for detection and/or diagnosis of SARS-CoV-2 by FDA under an Emergency Use Authorization (EUA). This EUA will remain in effect (meaning this test can be used) for the duration of the COVID-19 declaration under Section 564(b)(1) of the Act, 21 U.S.C. section 360bbb-3(b)(1),  unless the authorization is terminated or revoked.  Performed at [facility], [address], [place], [place] [address]   Surgical PCR screen     Status: None   Collection Time: 03/09/20  3:30 PM   Specimen: Nasal Mucosa; Nasal Swab  Result Value Ref Range Status   MRSA, PCR NEGATIVE NEGATIVE Final   Staphylococcus aureus NEGATIVE NEGATIVE Final    Comment: (NOTE) The Xpert SA Assay (FDA approved for NASAL specimens in patients 60 years of age and older), is one component of a comprehensive surveillance program. It is not intended to diagnose infection nor to guide or monitor treatment. Performed at [facility], [address] [place], [place] [address]      Labs:  COVID-19 Labs   Lab Results  Component Value Date   SARSCOV2NAA NEGATIVE 03/06/2020   SARSCOV2NAA NEGATIVE 10/24/2019   SARSCOV2NAA Not Detected 02/23/2019      Basic Metabolic Panel: Recent Labs  Lab 03/06/20 1435 03/07/20 2149 03/09/20 0351 03/10/20 0339 03/11/20 0300  NA 136 133* 139 138 133*  K 4.1 3.8 4.0 4.1 4.0  CL 101 101 108 105 102  CO2 25 24 23 26 23   GLUCOSE 121* 92 106* 113* 181*  BUN 22 16 12 10 14   CREATININE 0.93 0.72 0.87 0.92 0.93  CALCIUM 9.5 9.0 9.0 8.8* 8.8*  MG  --  2.3 2.1  --   --   PHOS  --  2.6  --   --   --    Liver Function Tests: Recent Labs  Lab 03/06/20 1435 03/07/20 2149 03/09/20 0351 03/10/20 0339 03/11/20 0300  AST 18  15 13* 14* 16  ALT 10 9 9 9 9   ALKPHOS 67 62 53 54 44  BILITOT 0.6 0.6 0.6 0.7 0.4  PROT 7.6 6.8 5.6* 5.8* 5.1*  ALBUMIN 4.2 3.8 3.1* 3.1* 3.1*   CBC: Recent Labs  Lab 03/06/20 1435 03/07/20 2149 03/09/20 0351 03/10/20 0339 03/11/20 0300  WBC 5.3   5.9 5.2 4.9 5.4 6.7  NEUTROABS 3.0  --   --   --   --   HGB 14.7   15.4 14.2 13.1 13.0 11.2*  HCT 44.7   44.6 43.0 39.2 38.8* 31.4*  MCV 93.9   92.3 94.1 91.6 91.5 91.0  PLT 193   202 183 147* 161 150    CBG: Recent Labs  Lab 03/06/20 1419  GLUCAP 123*     IMAGING  STUDIES CT ANGIO HEAD W OR WO CONTRAST  Result Date: 03/07/2020 CLINICAL DATA:  59 year old male with right CCA occlusion and high-grade left ICA stenosis on Doppler ultrasound yesterday. Small left MCA territory infarct on MRI yesterday. Severe left MCA M2 stenosis. EXAM: CT ANGIOGRAPHY HEAD AND NECK TECHNIQUE: Multidetector CT imaging of the head and neck was performed using the standard protocol during bolus administration of intravenous contrast. Multiplanar CT image reconstructions and MIPs were obtained to evaluate the vascular anatomy. Carotid stenosis measurements (when applicable) are obtained utilizing NASCET criteria, using the distal internal carotid diameter as the denominator. CONTRAST:  31mL OMNIPAQUE IOHEXOL 350 MG/ML SOLN COMPARISON:  Brain MRI and intracranial MRA yesterday. Carotid Doppler ultrasound yesterday. Head CT 03/06/2020. FINDINGS: CT HEAD Brain: Calcified atherosclerosis at the skull base. No significant change in acute on chronic left MCA territory infarcts with cytotoxic edema centered at the insula, superior left perirolandic encephalomalacia. No acute intracranial hemorrhage identified. No midline shift, mass effect, or evidence of intracranial mass lesion. No ventriculomegaly. Stable gray-white matter differentiation elsewhere. Calvarium and skull base: Negative. Paranasal sinuses: Visualized paranasal sinuses and mastoids are stable and well pneumatized. Orbits: Small right lateral scalp lipoma is stable. Other visualized orbits and scalp soft tissues are within normal limits. CTA NECK Skeleton: Largely absent dentition. Cervical spine degeneration. No acute osseous abnormality identified. Upper chest: Emphysema. Biapical lung scarring. No superior mediastinal lymphadenopathy. Other neck: Negative. Aortic arch: 3 vessel arch configuration with moderate soft and calcified arch atherosclerosis. Right carotid system: Brachiocephalic artery soft plaque without stenosis. Occluded  right CCA just beyond the origin (series 8, image 118). Reconstituted right carotid bifurcation. Superimposed calcified plaque at the right ICA origin and bulb with less than 50 % stenosis with respect to the distal vessel. Left carotid system: Left CCA origin soft and calcified plaque without stenosis. Additional soft plaque proximal to the bifurcation without stenosis. Short segment critical stenosis at the left ICA origin (series 9, image 134), short segment radiographic string sign. Additional plaque in the bulb but no other significant stenosis to the skull base. Vertebral arteries: Mild proximal right subclavian artery plaque without stenosis. No right vertebral artery origin plaque or stenosis. Right V2 calcified plaque and tortuosity without significant stenosis. Moderate proximal left subclavian artery plaque with less than 50% stenosis. Mild plaque at the left vertebral artery origin without stenosis. Tortuous left V1 segment. Codominant left vertebral with occasional plaque and tortuosity in the V2 segment but no significant stenosis to the skull base. CTA HEAD Posterior circulation: Codominant V4 segments are patent to the basilar without plaque or stenosis. Normal PICA origins. Patent basilar artery without stenosis. Normal SCA and PCA origins. Small bilateral posterior  communicating arteries. Tortuous left P1 segment. Bilateral PCA branches are within normal limits. Anterior circulation: Both ICA siphons are patent. Soft and calcified plaque on the left results in mild distal petrous and supraclinoid stenosis. Mostly calcified plaque on the right with mild if any siphon stenosis. Patent carotid termini. Normal MCA and ACA origins. Anterior communicating artery and bilateral ACA branches are within normal limits. Right MCA M1 segment and bifurcation are patent without stenosis. Right MCA branches are within normal limits. Left MCA M1 segment and trifurcation are patent without stenosis. No left MCA  branch occlusion is identified. Only mild left MCA branch irregularity noted. Venous sinuses: Patent. Anatomic variants: None. Review of the MIP images confirms the above findings IMPRESSION: 1. Occluded right CCA just beyond the origin with reconstituted right carotid bifurcation and no significant downstream stenosis despite additional atherosclerosis. 2. Short segment Critical Stenosis at the Left ICA origin - short segment RADIOGRAPHIC STRING SIGN. Only mild left ICA siphon stenosis. No left MCA occlusion or stenosis identified. Suspect the recent MRA appearance reflected the flow limiting stenosis in the neck. 3. Bilateral Vertebral Artery V2 atherosclerosis but no significant posterior circulation stenosis. 4. Stable CT appearance of acute and chronic left MCA territory infarcts. No new intracranial abnormality. 5. Aortic Atherosclerosis (ICD10-I70.0) and Emphysema (ICD10-J43.9). Electronically Signed   By: Odessa Fleming M.D.   On: 03/07/2020 18:52   DG Chest 2 View  Result Date: 03/06/2020 CLINICAL DATA:  Initial evaluation for acute CVA. EXAM: CHEST - 2 VIEW COMPARISON:  Prior radiograph from 10/24/2019. FINDINGS: Cardiac and mediastinal silhouettes are within normal limits. Mild aortic atherosclerosis. Lungs well inflated. No focal infiltrates. No edema or effusion. No pneumothorax. No acute osseous finding. IMPRESSION: 1. No active cardiopulmonary disease. 2.  Aortic Atherosclerosis (ICD10-I70.0). Electronically Signed   By: Rise Mu M.D.   On: 03/06/2020 21:22   CT Head Wo Contrast  Result Date: 03/06/2020 CLINICAL DATA:  Delirium. Additional history provided: Slow speech, chest pain. EXAM: CT HEAD WITHOUT CONTRAST TECHNIQUE: Contiguous axial images were obtained from the base of the skull through the vertex without intravenous contrast. COMPARISON:  Head CT 05/03/2011. FINDINGS: Brain: Mild cerebral and cerebellar atrophy. Infarction changes within the left insula and subinsular region, as  well as cortical/subcortical mid left frontal lobe/operculum, which may be subacute or chronic. These infarcts are overall small to moderate-sized. Chronic appearing cortically based infarcts more posteriorly within the left frontal and parietal lobes. There is no acute intracranial hemorrhage. No extra-axial fluid collection. No evidence of intracranial mass. No midline shift. Vascular: No hyperdense vessel.  Atherosclerotic calcifications. Skull: Normal. Negative for fracture or focal lesion. Sinuses/Orbits: Visualized orbits show no acute finding. No significant paranasal sinus disease at the imaged levels Other: 1.4 cm lipoma within the right temporoparietal scalp (series 2, image 19). IMPRESSION: Infarction changes within the left insula/subinsular region and adjacent left frontal lobe, which may be subacute or chronic. Consider brain MRI for further evaluation. Chronic appearing cortically based infarcts more posteriorly within the left frontal and parietal lobes. Mild parenchymal atrophy. Electronically Signed   By: Jackey Loge DO   On: 03/06/2020 15:55   CT ANGIO NECK W OR WO CONTRAST  Result Date: 03/07/2020 CLINICAL DATA:  59 year old male with right CCA occlusion and high-grade left ICA stenosis on Doppler ultrasound yesterday. Small left MCA territory infarct on MRI yesterday. Severe left MCA M2 stenosis. EXAM: CT ANGIOGRAPHY HEAD AND NECK TECHNIQUE: Multidetector CT imaging of the head and neck was performed using the  standard protocol during bolus administration of intravenous contrast. Multiplanar CT image reconstructions and MIPs were obtained to evaluate the vascular anatomy. Carotid stenosis measurements (when applicable) are obtained utilizing NASCET criteria, using the distal internal carotid diameter as the denominator. CONTRAST:  75mL OMNIPAQUE IOHEXOL 350 MG/ML SOLN COMPARISON:  Brain MRI and intracranial MRA yesterday. Carotid Doppler ultrasound yesterday. Head CT 03/06/2020. FINDINGS:  CT HEAD Brain: Calcified atherosclerosis at the skull base. No significant change in acute on chronic left MCA territory infarcts with cytotoxic edema centered at the insula, superior left perirolandic encephalomalacia. No acute intracranial hemorrhage identified. No midline shift, mass effect, or evidence of intracranial mass lesion. No ventriculomegaly. Stable gray-white matter differentiation elsewhere. Calvarium and skull base: Negative. Paranasal sinuses: Visualized paranasal sinuses and mastoids are stable and well pneumatized. Orbits: Small right lateral scalp lipoma is stable. Other visualized orbits and scalp soft tissues are within normal limits. CTA NECK Skeleton: Largely absent dentition. Cervical spine degeneration. No acute osseous abnormality identified. Upper chest: Emphysema. Biapical lung scarring. No superior mediastinal lymphadenopathy. Other neck: Negative. Aortic arch: 3 vessel arch configuration with moderate soft and calcified arch atherosclerosis. Right carotid system: Brachiocephalic artery soft plaque without stenosis. Occluded right CCA just beyond the origin (series 8, image 118). Reconstituted right carotid bifurcation. Superimposed calcified plaque at the right ICA origin and bulb with less than 50 % stenosis with respect to the distal vessel. Left carotid system: Left CCA origin soft and calcified plaque without stenosis. Additional soft plaque proximal to the bifurcation without stenosis. Short segment critical stenosis at the left ICA origin (series 9, image 134), short segment radiographic string sign. Additional plaque in the bulb but no other significant stenosis to the skull base. Vertebral arteries: Mild proximal right subclavian artery plaque without stenosis. No right vertebral artery origin plaque or stenosis. Right V2 calcified plaque and tortuosity without significant stenosis. Moderate proximal left subclavian artery plaque with less than 50% stenosis. Mild plaque at the  left vertebral artery origin without stenosis. Tortuous left V1 segment. Codominant left vertebral with occasional plaque and tortuosity in the V2 segment but no significant stenosis to the skull base. CTA HEAD Posterior circulation: Codominant V4 segments are patent to the basilar without plaque or stenosis. Normal PICA origins. Patent basilar artery without stenosis. Normal SCA and PCA origins. Small bilateral posterior communicating arteries. Tortuous left P1 segment. Bilateral PCA branches are within normal limits. Anterior circulation: Both ICA siphons are patent. Soft and calcified plaque on the left results in mild distal petrous and supraclinoid stenosis. Mostly calcified plaque on the right with mild if any siphon stenosis. Patent carotid termini. Normal MCA and ACA origins. Anterior communicating artery and bilateral ACA branches are within normal limits. Right MCA M1 segment and bifurcation are patent without stenosis. Right MCA branches are within normal limits. Left MCA M1 segment and trifurcation are patent without stenosis. No left MCA branch occlusion is identified. Only mild left MCA branch irregularity noted. Venous sinuses: Patent. Anatomic variants: None. Review of the MIP images confirms the above findings IMPRESSION: 1. Occluded right CCA just beyond the origin with reconstituted right carotid bifurcation and no significant downstream stenosis despite additional atherosclerosis. 2. Short segment Critical Stenosis at the Left ICA origin - short segment RADIOGRAPHIC STRING SIGN. Only mild left ICA siphon stenosis. No left MCA occlusion or stenosis identified. Suspect the recent MRA appearance reflected the flow limiting stenosis in the neck. 3. Bilateral Vertebral Artery V2 atherosclerosis but no significant posterior circulation stenosis. 4. Stable CT  appearance of acute and chronic left MCA territory infarcts. No new intracranial abnormality. 5. Aortic Atherosclerosis (ICD10-I70.0) and  Emphysema (ICD10-J43.9). Electronically Signed   By: Odessa Fleming M.D.   On: 03/07/2020 18:52   MR ANGIO HEAD WO CONTRAST  Addendum Date: 03/06/2020   ADDENDUM REPORT: 03/06/2020 17:13 ADDENDUM: Findings discussed with Dr. Jeraldine Loots via telephone at 5:08 PM. Electronically Signed   By: Feliberto Harts MD   On: 03/06/2020 17:13   Result Date: 03/06/2020 CLINICAL DATA:  Neuro deficit, acute stroke suspected. EXAM: MRI HEAD WITHOUT CONTRAST MRA HEAD WITHOUT CONTRAST TECHNIQUE: Multiplanar, multiecho pulse sequences of the brain and surrounding structures were obtained without intravenous contrast. Angiographic images of the head were obtained using MRA technique without contrast. COMPARISON:  Same day CT head. FINDINGS: MRI HEAD FINDINGS Brain: Acute infarcts in the anterior left MCA territory, including the anterior insula and overlying frontal lobe cortex and subcortical white matter. Mild associated edema without substantial mass effect. There is asymmetric chronic microvascular ischemic change involving the left MCA territory with a remote infarct in the left parietal lobe. No acute hemorrhage. No hydrocephalus. No mass lesion. Skull and upper cervical spine: Normal marrow signal. Lipoma of the right scalp. Sinuses/Orbits: Mild mucosal thickening without air-fluid levels. Unremarkable orbits. MRA HEAD FINDINGS Anterior circulation: Bilateral internal carotid arteries are patent bilateral M1 MCAs are patent. There is focal severe stenosis of the left proximal superior M2 MCA branch, which supplies the region of acute infarct detailed above. The right MCA and bilateral ACA branches are patent without evidence of hemodynamically significant proximal stenosis. No aneurysm identified. Posterior circulation: Bilateral intradural vertebral arteries and basilar artery are patent. Bilateral posterior cerebral arteries are patent without evidence of hemodynamically significant proximal stenosis. IMPRESSION: 1. Acute  anterior left MCA territory infarcts with associated severe stenosis of the superior left M2 MCA branch. 2. Asymmetric chronic microvascular ischemic change involving the left MCA territory with a remote infarct in the left parietal lobe. These findings suggest an element of chronic ischemia/hypoperfusion to the left MCA territory. Electronically Signed: By: Feliberto Harts MD On: 03/06/2020 17:00   MR Brain Wo Contrast (neuro protocol)  Addendum Date: 03/06/2020   ADDENDUM REPORT: 03/06/2020 17:13 ADDENDUM: Findings discussed with Dr. Jeraldine Loots via telephone at 5:08 PM. Electronically Signed   By: Feliberto Harts MD   On: 03/06/2020 17:13   Result Date: 03/06/2020 CLINICAL DATA:  Neuro deficit, acute stroke suspected. EXAM: MRI HEAD WITHOUT CONTRAST MRA HEAD WITHOUT CONTRAST TECHNIQUE: Multiplanar, multiecho pulse sequences of the brain and surrounding structures were obtained without intravenous contrast. Angiographic images of the head were obtained using MRA technique without contrast. COMPARISON:  Same day CT head. FINDINGS: MRI HEAD FINDINGS Brain: Acute infarcts in the anterior left MCA territory, including the anterior insula and overlying frontal lobe cortex and subcortical white matter. Mild associated edema without substantial mass effect. There is asymmetric chronic microvascular ischemic change involving the left MCA territory with a remote infarct in the left parietal lobe. No acute hemorrhage. No hydrocephalus. No mass lesion. Skull and upper cervical spine: Normal marrow signal. Lipoma of the right scalp. Sinuses/Orbits: Mild mucosal thickening without air-fluid levels. Unremarkable orbits. MRA HEAD FINDINGS Anterior circulation: Bilateral internal carotid arteries are patent bilateral M1 MCAs are patent. There is focal severe stenosis of the left proximal superior M2 MCA branch, which supplies the region of acute infarct detailed above. The right MCA and bilateral ACA branches are patent  without evidence of hemodynamically significant proximal stenosis. No  aneurysm identified. Posterior circulation: Bilateral intradural vertebral arteries and basilar artery are patent. Bilateral posterior cerebral arteries are patent without evidence of hemodynamically significant proximal stenosis. IMPRESSION: 1. Acute anterior left MCA territory infarcts with associated severe stenosis of the superior left M2 MCA branch. 2. Asymmetric chronic microvascular ischemic change involving the left MCA territory with a remote infarct in the left parietal lobe. These findings suggest an element of chronic ischemia/hypoperfusion to the left MCA territory. Electronically Signed: By: Feliberto Harts MD On: 03/06/2020 17:00   US Carotid Bilateral (at [facility] and [facility] only)  Result Date: 03/07/2020 CLINICAL DATA:  CVA. History of CAD (post myocardial infarction) hypertension and smoking. History of alcohol and cocaine abuse. EXAM: BILATERAL CAROTID DUPLEX ULTRASOUND TECHNIQUE: Wallace Cullens scale imaging, color Doppler and duplex ultrasound were performed of bilateral carotid and vertebral arteries in the neck. COMPARISON:  MRA of the head-03/06/2020 FINDINGS: Criteria: Quantification of carotid stenosis is based on velocity parameters that correlate the residual internal carotid diameter with NASCET-based stenosis levels, using the diameter of the distal internal carotid lumen as the denominator for stenosis measurement. The following velocity measurements were obtained: RIGHT ICA: 82/39 cm/sec CCA: Occluded SYSTOLIC ICA/CCA RATIO:  N/A ECA: 100 cm/sec (retrograde flow demonstrated within the right external carotid artery) LEFT ICA: 494/161 cm/sec CCA: 70/25 cm/sec SYSTOLIC ICA/CCA RATIO:  6.0 ECA: 474 cm/sec RIGHT CAROTID ARTERY: The right common carotid artery is occluded throughout its imaged course (images 4, 8, 11 and 74). There is reconstitution of flow within the carotid bulb via retrograde flow within the right external  carotid artery (images 22 and 23). There is a large amount of eccentric echogenic plaque involving the proximal and mid aspects of the right internal carotid artery (image 25). RIGHT VERTEBRAL ARTERY:  Antegrade flow LEFT CAROTID ARTERY: There is a moderate amount of eccentric echogenic plaque within the left carotid bulb (image 49 and 51). There is a large amount of eccentric echogenic plaque involving the origin and proximal aspects of the left internal carotid artery (image 60, which results in markedly elevated peak systolic velocities within the proximal aspect of the left internal carotid artery and a sonographic string sign (image 62). Greatest acquired peak systolic velocity with the proximal left ICA measures 422 centimeters/second (image 63). LEFT VERTEBRAL ARTERY:  Antegrade flow IMPRESSION: 1. 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 2. Moderate to large amount of left-sided atherosclerotic plaque results in a sonographic string sign and markedly elevated peak systolic velocities within the left internal carotid artery compatible with a subtotal occlusion. Further evaluation with CTA as clinically indicated. 3. Antegrade flow demonstrated within the bilateral vertebral arteries. These results will be called to the ordering clinician or representative by the Radiologist Assistant, and communication documented in the PACS or [employer]. Electronically Signed   By: Simonne Come M.D.   On: 03/07/2020 12:59   ECHOCARDIOGRAM COMPLETE  Result Date: 03/07/2020    ECHOCARDIOGRAM REPORT   Patient Name:   ZACKARIA BURKEY Date of Exam: 03/07/2020 Medical Rec #:  [id number]         Height:       71.5 in Accession #:    [id number]        Weight:       140.0 lb Date of Birth:  Jul 26, 1960         BSA:          1.821  m Patient Age:    59 years          BP:           120/77 mmHg Patient Gender: M                  HR:           84 bpm. [facility] Procedure: 2D Echo Indications:    Stroke 434.91 / I163.9  History:        Patient has prior history of Echocardiogram examinations, most                 recent 11/19/2017. CAD and Previous Myocardial Infarction,                 Stroke; Risk Factors:Current Smoker and Hypertension. ETOH,                 ETOH.  Sonographer:    Jeryl ColumbiaJohanna Elliott RDCS (AE) Referring Phys: [id number] ROBERT SCHERTZ IMPRESSIONS  1. Left ventricular ejection fraction, by estimation, is 40 to 45%. The left ventricle has mildly decreased function. The left ventricle demonstrates global hypokinesis. Left ventricular diastolic parameters were normal.  2. Right ventricular systolic function is normal. The right ventricular size is normal.  3. The mitral valve is normal in structure. No evidence of mitral valve regurgitation. No evidence of mitral stenosis.  4. The aortic valve has an indeterminant number of cusps. Aortic valve regurgitation is not visualized. No aortic stenosis is present.  5. The inferior vena cava is normal in size with greater than 50% respiratory variability, suggesting right atrial pressure of 3 mmHg. FINDINGS  Left Ventricle: Left ventricular ejection fraction, by estimation, is 40 to 45%. The left ventricle has mildly decreased function. The left ventricle demonstrates global hypokinesis. The left ventricular internal cavity size was normal in size. There is  no left ventricular hypertrophy. Left ventricular diastolic parameters were normal. Right Ventricle: The right ventricular size is normal. No increase in right ventricular wall thickness. Right ventricular systolic function is normal. Left Atrium: Left atrial size was normal in size. Right Atrium: Right atrial size was normal in size. Pericardium: There is no evidence of pericardial effusion. Mitral Valve: The mitral valve is normal in structure. No evidence of mitral valve regurgitation. No evidence of mitral  valve stenosis. Tricuspid Valve: The tricuspid valve is normal in structure. Tricuspid valve regurgitation is not demonstrated. No evidence of tricuspid stenosis. Aortic Valve: The aortic valve has an indeterminant number of cusps. Aortic valve regurgitation is not visualized. No aortic stenosis is present. Aortic valve mean gradient measures 3.1 mmHg. Aortic valve peak gradient measures 5.3 mmHg. Aortic valve area, by VTI measures 2.31 cm. Pulmonic Valve: The pulmonic valve was not well visualized. Pulmonic valve regurgitation is not visualized. No evidence of pulmonic stenosis. Aorta: The aortic root is normal in size and structure. Pulmonary Artery: Indeterminant PASP, inadequate TR jet. Venous: The inferior vena cava is normal in size with greater than 50% respiratory variability, suggesting right atrial pressure of 3 mmHg. IAS/Shunts: No atrial level shunt detected by color flow Doppler.  LEFT VENTRICLE PLAX 2D LVIDd:         5.46 cm  Diastology LVIDs:         4.50 cm  LV e' medial:    8.92 cm/s LV PW:         0.82 cm  LV E/e' medial:  7.2 LV IVS:  1.01 cm  LV e' lateral:   12.10 cm/s LVOT diam:     2.20 cm  LV E/e' lateral: 5.3 LV SV:         56 LV SV Index:   31 LVOT Area:     3.80 cm  RIGHT VENTRICLE RV S prime:     10.90 cm/s TAPSE (M-mode): 1.7 cm LEFT ATRIUM             Index       RIGHT ATRIUM          Index LA diam:        3.30 cm 1.81 cm/m  RA Area:     8.93 cm LA Vol (A2C):   30.4 ml 16.69 ml/m RA Volume:   19.00 ml 10.43 ml/m LA Vol (A4C):   23.6 ml 12.96 ml/m LA Biplane Vol: 26.8 ml 14.71 ml/m  AORTIC VALVE AV Area (Vmax):    2.51 cm AV Area (Vmean):   2.36 cm AV Area (VTI):     2.31 cm AV Vmax:           115.57 cm/s AV Vmean:          82.487 cm/s AV VTI:            0.244 m AV Peak Grad:      5.3 mmHg AV Mean Grad:      3.1 mmHg LVOT Vmax:         76.27 cm/s LVOT Vmean:        51.272 cm/s LVOT VTI:          0.148 m LVOT/AV VTI ratio: 0.61  AORTA Ao Root diam: 3.30 cm MITRAL VALVE MV  Area (PHT): 3.68 cm    SHUNTS MV Decel Time: 206 msec    Systemic VTI:  0.15 m MV E velocity: 63.80 cm/s  Systemic Diam: 2.20 cm MV A velocity: 59.10 cm/s MV E/A ratio:  1.08 Dina Rich MD Electronically signed by Dina Rich MD Signature Date/Time: 03/07/2020/12:56:34 PM    Final     DISCHARGE EXAMINATION: Vitals:   03/11/20 0300 03/11/20 0600 03/11/20 0752 03/11/20 1143  BP: 99/79 113/63 129/73 108/67  Pulse: 68 (!) 51 72 68  Resp: Temp: 98 F (36.7 C) 98.4 F (36.9 C) 98.1 F (36.7 C) 97.9 F (36.6 C)  TempSrc: Oral Oral Oral Oral  SpO2: 98% 98% 97% 95%  Weight:      Height:       General appearance: Awake alert.  In no distress Resp: Clear to auscultation bilaterally.  Normal effort Cardio: S1-S2 is normal regular.  No S3-S4.  No rubs murmurs or bruit GI: Abdomen is soft.  Nontender nondistended.  Bowel sounds are present normal.  No masses organomegaly    DISPOSITION: Home  Discharge Instructions    Ambulatory referral to Neurology   Complete by: As directed    Follow up with stroke clinic NP (Jessica Vanschaick or Darrol Angel, if both not available, consider Manson Allan, or [facility]) at [facility] in about 4 weeks. Thanks.   Call MD for:  difficulty breathing, headache or visual disturbances   Complete by: As directed    Call MD for:  extreme fatigue   Complete by: As directed    Call MD for:  persistant dizziness or light-headedness   Complete by: As directed    Call MD for:  persistant nausea and vomiting   Complete by: As directed    Call MD  for:  severe uncontrolled pain   Complete by: As directed    Call MD for:  temperature >100.4   Complete by: As directed    Diet - low sodium heart healthy   Complete by: As directed    Discharge instructions   Complete by: As directed    Please take your medications as prescribed.  Be sure to follow-up with Dr. Arbie Cookey in 2 to 3 weeks in which well.  You were cared for by a hospitalist during your  hospital stay. If you have any questions about your discharge medications or the care you received while you were in the hospital after you are discharged, you can call the unit and asked to speak with the hospitalist on call if the hospitalist that took care of you is not available. Once you are discharged, your primary care physician will handle any further medical issues. Please note that NO REFILLS for any discharge medications will be authorized once you are discharged, as it is imperative that you return to your primary care physician (or establish a relationship with a primary care physician if you do not have one) for your aftercare needs so that they can reassess your need for medications and monitor your lab values. If you do not have a primary care physician, you can call [phone] for a physician referral.   Increase activity slowly   Complete by: As directed    No wound care   Complete by: As directed         Allergies as of 03/11/2020   No Known Allergies     Medication List    TAKE these medications   aspirin EC 81 MG tablet Take 1 tablet (81 mg total) by mouth daily. Swallow whole.   atorvastatin 40 MG tablet Commonly known as: LIPITOR Take 1 tablet (40 mg total) by mouth daily.   carvedilol 3.125 MG tablet Commonly known as: COREG Take 1 tablet (3.125 mg total) by mouth 2 (two) times daily.   clopidogrel 75 MG tablet Commonly known as: PLAVIX Take 1 tablet (75 mg total) by mouth daily for 21 days.   FLUoxetine 20 MG capsule Commonly known as: PROZAC Take 1 capsule (20 mg total) by mouth daily.   multivitamin with minerals Tabs tablet Take 1 tablet by mouth daily.   thiamine 100 MG tablet Take 1 tablet (100 mg total) by mouth daily.         Follow-up Information    [facility] Follow up.   Why: Please call them and follow up for an appointment Contact information: [address] [phone]       Larina Earthly, MD Follow up in 3 week(s).    Specialties: Vascular Surgery, Cardiology Contact information: [address] [place] [address] [phone]        [facility] Follow up.   Why: Call for eligibility appointment Contact information: [address] [place] [address] [phone]       [facility]. Schedule an appointment as soon as possible for a visit in 4 week(s).   Specialty: Neurology Contact information: [address] [place] [address] [phone]              TOTAL DISCHARGE TIME: 35 minutes  Javionna Leder Rito Ehrlich  [facility] Pager on [email or web address]  03/11/2020, 2:27 PM

## 2020-03-11 NOTE — Progress Notes (Signed)
STROKE TEAM PROGRESS NOTE   INTERVAL HISTORY No family is at the bedside.  Pt is lying in bed, doing well. Had left [facility] yesterday. No complains. On DAPT. Plan to be discharged today. Had long discussion about smoking and cocaine cessation, limiting alcohol.    OBJECTIVE Vitals:   03/10/20 2300 03/11/20 0100 03/11/20 0300 03/11/20 0600  BP: (!) 81/52 (!) 113/51 99/79 113/63  Pulse: 66 74 68 (!) 51  Resp: 16 18 16 17   Temp: 98.2 F (36.8 C) 98 F (36.7 C) 98 F (36.7 C) 98.4 F (36.9 C)  TempSrc: Oral Oral Oral Oral  SpO2: 96% 98% 98% 98%  Weight:      Height:        CBC:  Recent Labs  Lab 03/06/20 1435 03/07/20 2149 03/10/20 0339 03/11/20 0300  WBC 5.3  5.9   < > 5.4 6.7  NEUTROABS 3.0  --   --   --   HGB 14.7  15.4   < > 13.0 11.2*  HCT 44.7  44.6   < > 38.8* 31.4*  MCV 93.9  92.3   < > 91.5 91.0  PLT 193  202   < > 161 150   < > = values in this interval not displayed.    Basic Metabolic Panel:  Recent Labs  Lab 03/07/20 2149 03/09/20 0351 03/10/20 0339 03/11/20 0300  NA 133* 139 138 133*  K 3.8 4.0 4.1 4.0  CL 101 108 105 102  CO2 24 23 26 23   GLUCOSE 92 106* 113* 181*  BUN 16 12 10 14   CREATININE 0.72 0.87 0.92 0.93  CALCIUM 9.0 9.0 8.8* 8.8*  MG 2.3 2.1  --   --   PHOS 2.6  --   --   --     Lipid Panel:     Component Value Date/Time   CHOL 137 03/07/2020 0526   TRIG 104 03/07/2020 0526   HDL 36 (L) 03/07/2020 0526   CHOLHDL 3.8 03/07/2020 0526   VLDL 21 03/07/2020 0526   LDLCALC 80 03/07/2020 0526   HgbA1c:  Lab Results  Component Value Date   HGBA1C 5.7 (H) 03/07/2020   Urine Drug Screen:     Component Value Date/Time   LABOPIA NONE DETECTED 03/08/2020 0752   COCAINSCRNUR POSITIVE (A) 03/08/2020 0752   LABBENZ POSITIVE (A) 03/08/2020 0752   AMPHETMU NONE DETECTED 03/08/2020 0752   THCU NONE DETECTED 03/08/2020 0752   LABBARB NONE DETECTED 03/08/2020 0752    Alcohol Level     Component Value Date/Time   ETH 30 (H)  07/20/2017 2140    IMAGING  CT ANGIO HEAD W OR WO CONTRAST CT ANGIO NECK W OR WO CONTRAST 03/07/2020 IMPRESSION:  1. Occluded right CCA just beyond the origin with reconstituted right carotid bifurcation and no significant downstream stenosis despite additional atherosclerosis.  2. Short segment Critical Stenosis at the Left ICA origin - short segment RADIOGRAPHIC STRING SIGN. Only mild left ICA siphon stenosis. No left MCA occlusion or stenosis identified. Suspect the recent MRA appearance reflected the flow limiting stenosis in the neck.  3. Bilateral Vertebral Artery V2 atherosclerosis but no significant posterior circulation stenosis.  4. Stable CT appearance of acute and chronic left MCA territory infarcts. No new intracranial abnormality.  5. Aortic Atherosclerosis (ICD10-I70.0) and Emphysema (ICD10-J43.9).   DG Chest 2 View 03/06/2020 IMPRESSION:  1. No active cardiopulmonary disease.  2.  Aortic Atherosclerosis (ICD10-I70.0).   CT Head Wo Contrast 03/06/2020 IMPRESSION:  Infarction  changes within the left insula/subinsular region and adjacent left frontal lobe, which may be subacute or chronic. Consider brain MRI for further evaluation. Chronic appearing cortically based infarcts more posteriorly within the left frontal and parietal lobes. Mild parenchymal atrophy.   MR Brain Wo Contrast (neuro protocol) MR ANGIO HEAD WO CONTRAST 03/06/2020   IMPRESSION:  1. Acute anterior left MCA territory infarcts with associated severe stenosis of the superior left M2 MCA branch.  2. Asymmetric chronic microvascular ischemic change involving the left MCA territory with a remote infarct in the left parietal lobe. These findings suggest an element of chronic ischemia/hypoperfusion to the left MCA territory.   [facility]) 03/07/2020 IMPRESSION:  1. 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  2. Moderate to large amount of left-sided atherosclerotic plaque results in a sonographic string sign and markedly elevated peak systolic velocities within the left internal carotid artery compatible with a subtotal occlusion. Further evaluation with CTA as clinically indicated.  3. Antegrade flow demonstrated within the bilateral vertebral arteries.   ECHOCARDIOGRAM COMPLETE 03/07/2020 IMPRESSIONS   1. Left ventricular ejection fraction, by estimation, is 40 to 45%. The left ventricle has mildly decreased function. The left ventricle demonstrates global hypokinesis. Left ventricular diastolic parameters were normal.   2. Right ventricular systolic function is normal. The right ventricular size is normal.   3. The mitral valve is normal in structure. No evidence of mitral valve regurgitation. No evidence of mitral stenosis.   4. The aortic valve has an indeterminant number of cusps. Aortic valve regurgitation is not visualized. No aortic stenosis is present.   5. The inferior vena cava is normal in size with greater than 50% respiratory variability, suggesting right atrial pressure of 3 mmHg.   ECG - ST rate 113 BPM. (See cardiology reading for complete details)  PHYSICAL EXAM Blood pressure 113/63, pulse (!) 51, temperature 98.4 F (36.9 C), temperature source Oral, resp. rate 17, height 5' 11.5" (1.816 m), weight 65.4 kg, SpO2 98 %.    Temp:  [97.8 F (36.6 C)-98.4 F (36.9 C)] 98.1 F (36.7 C) (12/11 0752) Pulse Rate:  [51-99] 72 (12/11 0752) Resp:  [15-20] 20 (12/11 0752) BP: (81-129)/(47-79) 129/73 (12/11 0752) SpO2:  [92 %-100 %] 97 % (12/11 0752) Arterial Line BP: (94-137)/(39-66) 94/39 (12/10 2300) Weight:  [65.4 kg] 65.4 kg (12/10 1120)  General - Well nourished, well developed, in no apparent distress.  Ophthalmologic - fundi not visualized due to noncooperation.  Cardiovascular - Regular rhythm and rate.  Mental Status -  Level  of arousal and orientation to time, place, and person were intact. Language including expression, naming, repetition, comprehension was assessed and found intact. Fund of Knowledge was assessed and was intact.  Cranial Nerves II - XII - II - Visual field intact OU. III, IV, VI - Extraocular movements intact. V - Facial sensation intact bilaterally. VII - Facial movement intact bilaterally. VIII - Hearing & vestibular intact bilaterally. X - Palate elevates symmetrically.  Mild dysarthria due to poor denture XI - Chin turning & shoulder shrug intact bilaterally. XII - Tongue protrusion intact.  Motor Strength - The patient's strength was normal in all extremities and pronator drift was absent.  Bulk was normal and fasciculations were absent.   Motor Tone - Muscle tone was assessed at the neck and appendages and was normal.  Reflexes - The patient's  reflexes were symmetrical in all extremities and he had no pathological reflexes.  Sensory - Light touch, temperature/pinprick were assessed and were symmetrical.    Coordination - The patient had normal movements in the hands with no ataxia or dysmetria.  Tremor was absent.  Gait and Station - deferred.   ASSESSMENT/PLAN [name] is a 59 y.o. 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 He presented to [facility] with AMS and slurred speech and was subsequently found to have an acute left MCA infarct with critical stenosis of the left ICA and complete occlusion of the right carotid. He did not receive IV t-PA due to LKW time unknown.  Stroke: LMCA infarct, likely d/t Large vessel disease source d/t high grade LICA stenosis  CT head - Infarction changes within the left insula/subinsular region and adjacent left frontal lobe, which may be subacute or chronic. Chronic appearing cortically  based infarcts more posteriorly within the left frontal and parietal lobes.   MRI / MRA head - Acute anterior left MCA territory infarcts with associated severe stenosis of the superior left M2 MCA branch. Remote infarct in the left parietal lobe. These findings suggest an element of chronic ischemia/hypoperfusion to the left MCA territory.  CTA H&N - Occluded right CCA just beyond the origin with reconstituted right carotid bifurcation and no significant downstream stenosis despite additional atherosclerosis. Short segment Critical Stenosis at the Left ICA origin - short segment RADIOGRAPHIC STRING SIGN. Only mild left ICA siphon stenosis. No left MCA occlusion or stenosis identified. Suspect the recent MRA appearance reflected the flow limiting stenosis in the neck.   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 Moderate to large amount of left-sided atherosclerotic plaque results in a sonographic string sign and markedly elevated peak systolic velocities within the left internal carotid artery compatible with a subtotal occlusion  2D Echo - EF 40 to 45%. No cardiac source of emboli identified.   [employer] Virus 2 - negative  LDL - 80  HgbA1c - 5.7  UDS - cocaine and benzodiazepine positive  VTE prophylaxis - Lovenox  aspirin 81 mg daily prior to admission but not compliant, now on aspirin 81 mg daily and clopidogrel 75 mg daily DAPT for 3 weeks and then ASA alone.  Patient will be counseled to be compliant with his antithrombotic medications  Ongoing aggressive stroke risk factor management  Therapy recommendations: outpt PT  Disposition:  Home today  Carotid stenosis  Right CCA occlusion proximally, with right ICA retrograde perfusion from right ECA  Left ICA bulb string sign on CTA and carotid Doppler  Status post left CEA 03/10/2020  Doing well  post surgery  Continue follow-up with vascular surgery as outpatient  On DAPT  Hypertension  Home BP meds: Coreg  Current BP meds: Coreg . Stable . Long-term BP goal normotensive . Avoid low BP  Hyperlipidemia  Home Lipid lowering medication: Lipitor 40 mg daily  LDL 80, goal < 70  Current lipid lowering medication: Lipitor 40 mg daily   Continue statin at discharge  Tobacco abuse  Current smoker  Smoking cessation counseling provided  Nicotine patch provided  Pt is willing to quit  Cocaine abuse  UDS positive for cocaine  Cocaine cessation education provided  Patient willing to quit  Other Stroke Risk Factors  Advanced age  ETOH use, advised to drink no more than 1 alcoholic beverage per day.   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 Please call with questions. Pt will follow up with stroke clinic NP at [facility] in about 4 weeks. Thanks for the consult.  Marvel Plan, MD PhD Stroke Neurology 03/11/2020 10:54 AM   To contact Stroke Continuity provider, please refer to [email or web address]. After hours, contact General Neurology

## 2020-03-11 NOTE — Progress Notes (Signed)
Occupational Therapy Treatment Patient Details Name: Craig Perry MRN: [id number] DOB: 17-Jun-1960 Today's Date: 03/11/2020    History of present illness Pt is a 59 year old male who presented to [facility] ED with altered mental status and slurred speech. MRI revealed acute L anterior MCA infarct with severe stenosis of L ICA and complete occlusion of R carotid, and thus was transferred to [facility] for further vascular surgery eval. Plan for L CEA Friday 03/10/20. No tPA administered. NIHSS 2-0. PMH: CVA, pneumothorax, MI, HTN, hx of noncompliance with medical treatment, cocaine abuse, CHF, CAD, and alcohol abuse.   OT comments  Pt. Seen for skilled OT treatment session. Safe demonstration of bed mobility, lb dressing, and simulated toileting task.  Eager for d/c home later today when able.    Follow Up Recommendations  Supervision/Assistance - 24 hour;Home health OT    Equipment Recommendations  3 in 1 bedside commode    Recommendations for Other Services      Precautions / Restrictions Precautions Precautions: Fall       Mobility Bed Mobility Overal bed mobility: Independent                Transfers   Equipment used: None Transfers: Sit to/from [employer] Sit to Stand: Min guard Stand pivot transfers: Min guard            Balance                                           ADL either performed or assessed with clinical judgement   ADL Overall ADL's : Needs assistance/impaired                     Lower Body Dressing: Set up;Sitting/lateral leans Lower Body Dressing Details (indicate cue type and reason): able to perform figure 4 to don socks Toilet Transfer: Min guard;Ambulation Toilet Transfer Details (indicate cue type and reason): close min guard for safety-simulated in room         Functional mobility during ADLs: Min guard General ADL Comments: pt. able to demonstrate lb dressing and simulated  toileting tasks with no lob or issues noted. very eager to d/c home today     Vision       Perception     Praxis      Cognition Arousal/Alertness: Awake/alert Behavior During Therapy: [facility] for tasks assessed/performed                                            Exercises     Shoulder Instructions       General Comments      Pertinent Vitals/ Pain       Pain Assessment: Faces Faces Pain Scale: Hurts even more Pain Location: L side of neck Pain Descriptors / Indicators: Aching  Home Living                                          Prior Functioning/Environment              Frequency  Min 2X/week        Progress Toward Goals  OT Goals(current goals  can now be found in the care plan section)  Progress towards OT goals: Progressing toward goals     Plan      Co-evaluation                 AM-PAC OT "6 Clicks" Daily Activity     Outcome Measure     Help from another person taking care of personal grooming?: A Little Help from another person toileting, which includes using toliet, bedpan, or urinal?: A Little Help from another person bathing (including washing, rinsing, drying)?: A Little Help from another person to put on and taking off regular upper body clothing?: A Little Help from another person to put on and taking off regular lower body clothing?: A Little 6 Click Score: 15    End of Session    OT Visit Diagnosis: Unsteadiness on feet (R26.81);Muscle weakness (generalized) (M62.81);Other symptoms and signs involving the nervous system (R29.898);Other symptoms and signs involving cognitive function   Activity Tolerance Patient tolerated treatment well   Patient Left in bed;with call bell/phone within reach   Nurse Communication Other (comment) (rn stated ok to work with pt. once pain meds given)        Time: 1610-9604 OT Time Calculation (min): 18 min  Charges: OT General Charges $OT Visit: 1  Visit OT Treatments $Self Care/Home Management : 8-22 mins  Boneta Lucks, COTA/L Acute Rehabilitation [phone]   Robet Leu 03/11/2020, 11:49 AM

## 2020-03-11 NOTE — TOC Transition Note (Signed)
Transition of Care [facility]) - CM/SW Discharge Note   Patient Details  Name: Craig Perry MRN: [id number] Date of Birth: Jan 27, 1961  Transition of [facility]) CM/SW Contact:  Glennon Mac, RN Phone Number: 03/11/2020, 12:42 PM   Clinical Narrative: Patient medically stable for discharge home today; patient lives alone, states daughter assists him as needed.  Patient is uninsured but eligible for medication assistance with [facility] program.  [facility] letter given with explanation of program benefits; patient will be able to get medications at his pharmacy for $3 per prescription.  Provided patient with information on [facility].  He states he will follow-up with them for PCP.    Final next level of care: Home/Self Care Barriers to Discharge: Barriers Resolved      Discharge Plan and Services   Discharge Planning Services: CM Consult,Indigent Health Clinic,Medication [facility] Program                                 Social Determinants of Health (SDOH) Interventions     Readmission Risk Interventions No flowsheet data found.  Quintella Baton, RN, BSN  Trauma/Neuro ICU Case Manager [phone]

## 2020-03-11 NOTE — Progress Notes (Signed)
  Progress Note    03/11/2020 8:08 AM 1 Day Post-Op  Subjective:  Patient believes speech is improved   Vitals:   03/11/20 0600 03/11/20 0752  BP: 113/63 129/73  Pulse: (!) 51 72  Resp: 17 20  Temp: 98.4 F (36.9 C) 98.1 F (36.7 C)  SpO2: 98% 97%   Physical Exam: Lungs:  Non labored Incisions:  L neck incision c/d/i without hematoma Extremities:  Moving all extremities well Abdomen:  soft Neurologic: A&O  CBC    Component Value Date/Time   WBC 6.7 03/11/2020 0300   RBC 3.45 (L) 03/11/2020 0300   HGB 11.2 (L) 03/11/2020 0300   HCT 31.4 (L) 03/11/2020 0300   PLT 150 03/11/2020 0300   MCV 91.0 03/11/2020 0300   MCH 32.5 03/11/2020 0300   MCHC 35.7 03/11/2020 0300   RDW 13.2 03/11/2020 0300   LYMPHSABS 1.4 03/06/2020 1435   MONOABS 0.8 03/06/2020 1435   EOSABS 0.1 03/06/2020 1435   BASOSABS 0.0 03/06/2020 1435    BMET    Component Value Date/Time   NA 133 (L) 03/11/2020 0300   K 4.0 03/11/2020 0300   CL 102 03/11/2020 0300   CO2 23 03/11/2020 0300   GLUCOSE 181 (H) 03/11/2020 0300   BUN 14 03/11/2020 0300   CREATININE 0.93 03/11/2020 0300   CALCIUM 8.8 (L) 03/11/2020 0300   GFRNONAA >60 03/11/2020 0300   GFRAA >60 10/24/2019 1400    INR    Component Value Date/Time   INR 1.1 03/10/2020 0339     Intake/Output Summary (Last 24 hours) at 03/11/2020 2376 Last data filed at 03/11/2020 0753 Gross per 24 hour  Intake 1750 ml  Output 900 ml  Net 850 ml     Assessment/Plan:  59 y.o. male is s/p L CEA for symptomatic stenosis 1 Day Post-Op   Neuro exam stable overnight Continue aspirin and plavix Encouraged OOB Ok for discharge from vascular standpoint; follow up with Dr. Arbie Cookey in [place] in 2-3 weeks   Emilie Rutter, PA-C [facility] [phone] 03/11/2020 8:08 AM

## 2020-03-11 NOTE — Progress Notes (Signed)
Pt discharged today to home with family.  Pt's IV's removed.  Pt taken off telemetry.  Pt left with all of their personal belongings.  AVS documentation reviewed with Pt and all questions answered.

## 2020-03-13 ENCOUNTER — Encounter (HOSPITAL_COMMUNITY): Payer: Self-pay | Admitting: Vascular Surgery

## 2020-04-10 ENCOUNTER — Ambulatory Visit (INDEPENDENT_AMBULATORY_CARE_PROVIDER_SITE_OTHER): Payer: Self-pay | Admitting: Vascular Surgery

## 2020-04-10 ENCOUNTER — Other Ambulatory Visit: Payer: Self-pay

## 2020-04-10 ENCOUNTER — Encounter: Payer: Self-pay | Admitting: Vascular Surgery

## 2020-04-10 VITALS — BP 131/78 | HR 107 | Temp 97.8°F | Resp 16 | Ht 71.5 in | Wt 141.0 lb

## 2020-04-10 DIAGNOSIS — I6523 Occlusion and stenosis of bilateral carotid arteries: Secondary | ICD-10-CM

## 2020-04-10 NOTE — Progress Notes (Signed)
   [facility] [place]  Patient name: Craig Perry MRN: [id number] DOB: 1961/01/20 Sex: male  REASON FOR VISIT: Here today for follow-up of recent left carotid endarterectomy on 03/10/2020.  He had presented with acute stroke with expressive aphasia on 03/08/2020.  Work-up revealed critical stenosis in his left internal carotid artery and occlusion of his right common carotid artery.  HPI: RAHIEM Perry is a 60 y.o. male here for follow-up.  He underwent uneventful endarterectomy.  He was discharged home on postoperative day #1.  He reports that he is returned completely to his preoperative baseline.  He reports no difficulty with speech and no focal weakness.  Current Outpatient Medications  Medication Sig Dispense Refill  . aspirin EC 81 MG tablet Take 1 tablet (81 mg total) by mouth daily. Swallow whole. 30 tablet 2  . atorvastatin (LIPITOR) 40 MG tablet Take 1 tablet (40 mg total) by mouth daily. 30 tablet 2  . carvedilol (COREG) 3.125 MG tablet Take 1 tablet (3.125 mg total) by mouth 2 (two) times daily. 60 tablet 2  . FLUoxetine (PROZAC) 20 MG capsule Take 1 capsule (20 mg total) by mouth daily. 30 capsule 0  . Multiple Vitamin (MULTIVITAMIN WITH MINERALS) TABS tablet Take 1 tablet by mouth daily.    Marland Kitchen thiamine 100 MG tablet Take 1 tablet (100 mg total) by mouth daily. 30 tablet 0   No current facility-administered medications for this visit.     PHYSICAL EXAM: Vitals:   04/10/20 0933  BP: 131/78  Pulse: (!) 107  Resp: 16  Temp: 97.8 F (36.6 C)  TempSrc: Other (Comment)  SpO2: 96%  Weight: 141 lb (64 kg)  Height: 5' 11.5" (1.816 m)    GENERAL: The patient is a well-nourished male, in no acute distress. The vital signs are documented above. Neck incision is well-healed.  Neurologically intact.  Speech is normal.  MEDICAL ISSUES: Stable status post left carotid endarterectomy for symptomatic disease.  Will  resume full activities.  We will see him again in 9 months with repeat carotid duplex.  He reports that he has attempted to reduce his smoking.  He reports that he was smoking over 2 packs/day and is now down to less than 1 pack/day.  I encouraged him and explained the critical importance of smoking cessation.   Larina Earthly, MD FACS [facility] Tel [phone]

## 2020-04-18 ENCOUNTER — Inpatient Hospital Stay: Payer: Self-pay | Admitting: Adult Health

## 2020-04-24 ENCOUNTER — Encounter: Payer: Self-pay | Admitting: Cardiology

## 2020-04-24 ENCOUNTER — Other Ambulatory Visit: Payer: Self-pay

## 2020-04-24 ENCOUNTER — Ambulatory Visit (INDEPENDENT_AMBULATORY_CARE_PROVIDER_SITE_OTHER): Payer: Self-pay | Admitting: Cardiology

## 2020-04-24 ENCOUNTER — Other Ambulatory Visit (HOSPITAL_COMMUNITY): Payer: Self-pay

## 2020-04-24 VITALS — BP 114/68 | HR 76 | Ht 71.5 in | Wt 144.6 lb

## 2020-04-24 DIAGNOSIS — I5022 Chronic systolic (congestive) heart failure: Secondary | ICD-10-CM

## 2020-04-24 DIAGNOSIS — I2510 Atherosclerotic heart disease of native coronary artery without angina pectoris: Secondary | ICD-10-CM

## 2020-04-24 DIAGNOSIS — I6523 Occlusion and stenosis of bilateral carotid arteries: Secondary | ICD-10-CM

## 2020-04-24 NOTE — Progress Notes (Signed)
Clinical Summary Craig Perry is a 60 y.o.male seen today for follow up of the following medical problems.    1. CAD - prior inferior STEMI s/p DES to RCA with residual LAD disease 05/2017 nuclear stress: large inreior infarct, no ischemia. High risk due to LVEF 30%  - poor compliance with meds - no recent chest pains. No SOB or DOE.   2. Chronic systolic HF - Jan 2018 echo LVEF 25-30%, diffuse hypokinesis, grade I diastolic dysfunction - has not been on ACEI/ARB/ARNI/aldactone due to soft bp's  - 03/2020 echo LVEF 40-45%   - no recent edema, no SOB/DOE.      3. EtOH abuse - last drink Dec 26 - has cut back on EtOH  4. Medication noncompliance prevoiusly he stopped his brillinta after his previous stent after 6 months on his own.   5. Carotid stenosis with CVA - left CEA 03/10/20 - followed by vascular     Past Medical History:  Diagnosis Date  . Alcohol abuse   . CAD (coronary artery disease)    a. 2014 cath with non obst CAD, EF 35%.    b. 04/2016: inferior STEMI s/p DES to RCA. 70% LAD with no intervention.  . CHF (congestive heart failure) (HCC)   . Cocaine abuse (HCC)   . History of noncompliance with medical treatment, presenting hazards to health   . Hypertension   . Myocardial infarction (HCC)   . Pneumothorax   . Stroke [facility])      No Known Allergies   Current Outpatient Medications  Medication Sig Dispense Refill  . aspirin EC 81 MG tablet Take 1 tablet (81 mg total) by mouth daily. Swallow whole. 30 tablet 2  . atorvastatin (LIPITOR) 40 MG tablet Take 1 tablet (40 mg total) by mouth daily. 30 tablet 2  . carvedilol (COREG) 3.125 MG tablet Take 1 tablet (3.125 mg total) by mouth 2 (two) times daily. 60 tablet 2  . FLUoxetine (PROZAC) 20 MG capsule Take 1 capsule (20 mg total) by mouth daily. 30 capsule 0  . Multiple Vitamin (MULTIVITAMIN WITH MINERALS) TABS tablet Take 1 tablet by mouth daily.    Marland Kitchen thiamine 100 MG tablet Take 1  tablet (100 mg total) by mouth daily. 30 tablet 0   No current facility-administered medications for this visit.     Past Surgical History:  Procedure Laterality Date  . ABDOMINAL SURGERY    . CARDIAC CATHETERIZATION N/A 04/20/2016   Procedure: Left Heart Cath and Coronary Angiography;  Surgeon: Marykay Lex, MD;  Location: [facility] INVASIVE CV LAB;  Service: Cardiovascular;  Laterality: N/A;  . CARDIAC CATHETERIZATION N/A 04/20/2016   Procedure: Coronary Stent Intervention;  Surgeon: Marykay Lex, MD;  Location: [facility] INVASIVE CV LAB;  Service: Cardiovascular;  Laterality: N/A;  . COLON SURGERY    . ENDARTERECTOMY Left 03/10/2020   Procedure: LEFT CAROTID ENDARTERECTOMY WITH PATCH ANGIOPLASTY;  Surgeon: Larina Earthly, MD;  Location: [facility] OR;  Service: Vascular;  Laterality: Left;  . LEFT HEART CATHETERIZATION WITH CORONARY ANGIOGRAM N/A 03/08/2013   Procedure: LEFT HEART CATHETERIZATION WITH CORONARY ANGIOGRAM;  Surgeon: Micheline Chapman, MD;  Location: [facility] CATH LAB;  Service: Cardiovascular;  Laterality: N/A;  . LIVER SURGERY    . SPLENECTOMY       No Known Allergies    Family History  Problem Relation Age of Onset  . Heart attack Mother   . Diabetes Mother   . Stroke Mother   . Heart  attack Father   . Diabetes Father      Social History Craig Perry reports that he has been smoking cigarettes. He has a 90.00 pack-year smoking history. He has never used smokeless tobacco. Craig Perry reports current alcohol use of about 6.0 standard drinks of alcohol per week.   Review of Systems CONSTITUTIONAL: No weight loss, fever, chills, weakness or fatigue.  HEENT: Eyes: No visual loss, blurred vision, double vision or yellow sclerae.No hearing loss, sneezing, congestion, runny nose or sore throat.  SKIN: No rash or itching.  CARDIOVASCULAR: per hpi RESPIRATORY: No shortness of breath, cough or sputum.  GASTROINTESTINAL: No anorexia, nausea, vomiting or diarrhea. No abdominal pain or  blood.  GENITOURINARY: No burning on urination, no polyuria NEUROLOGICAL: No headache, dizziness, syncope, paralysis, ataxia, numbness or tingling in the extremities. No change in bowel or bladder control.  MUSCULOSKELETAL: No muscle, back pain, joint pain or stiffness.  LYMPHATICS: No enlarged nodes. No history of splenectomy.  PSYCHIATRIC: No history of depression or anxiety.  ENDOCRINOLOGIC: No reports of sweating, cold or heat intolerance. No polyuria or polydipsia.  Marland Kitchen   Physical Examination Today's Vitals   04/24/20 1316  BP: 114/68  Pulse: 76  SpO2: 97%  Weight: 144 lb 9.6 oz (65.6 kg)  Height: 5' 11.5" (1.816 m)   Body mass index is 19.89 kg/m.  Gen: resting comfortably, no acute distress HEENT: no scleral icterus, pupils equal round and reactive, no palptable cervical adenopathy,  CV: RRR, no m/r/g, no jvd Resp: Clear to auscultation bilaterally GI: abdomen is soft, non-tender, non-distended, normal bowel sounds, no hepatosplenomegaly MSK: extremities are warm, no edema.  Skin: warm, no rash Neuro:  no focal deficits Psych: appropriate affect   Diagnostic Studies 10/2017 echo Study Conclusions  - Left ventricle: The cavity size was normal. Wall thickness was increased in a pattern of mild LVH. Systolic function was moderately to severely reduced. The estimated ejection fraction was = 35%. The study is not technically sufficient to allow evaluation of LV diastolic function. - Aortic valve: Mildly calcified annulus. Normal thickness leaflets. Valve area (VTI): 2.17 cm^2. Valve area (Vmax): 2.8 cm^2. - Atrial septum: No defect or patent foramen ovale was identified. - Technically difficult study.  03/2020 echo  Assessment and Plan  1. CAD - poor compliance with DAPT after previous stent, higher threshold in the future for cath.  - no recent symptoms, encouraged continued medication compliance  2. Chronic systolic HF - management has been  complicated by poor compliance - has not been on ACE/ARB/ARNI/aldactone due to intermittent soft bps - not ICD candidate due to poor compliance - LVEF actually has improved based on most recent echo, up to 40-45%. No longer potential candidate for ICD - continue current meds  3. Carotid stenosis - continue ASA/statin - continue f/u with vascular    Antoine Poche, M.D.

## 2020-04-24 NOTE — Patient Instructions (Signed)
Medication Instructions:  Your physician recommends that you continue on your current medications as directed. Please refer to the Current Medication list given to you today.  *If you need a refill on your cardiac medications before your next appointment, please call your pharmacy*   Lab Work: None today If you have labs (blood work) drawn today and your tests are completely normal, you will receive your results only by: . MyChart Message (if you have MyChart) OR . A paper copy in the mail If you have any lab test that is abnormal or we need to change your treatment, we will call you to review the results.   Testing/Procedures: None today   Follow-Up: At [facility], you and your health needs are our priority.  As part of our continuing mission to provide you with exceptional heart care, we have created designated Provider Care Teams.  These Care Teams include your primary Cardiologist (physician) and Advanced Practice Providers (APPs -  Physician Assistants and Nurse Practitioners) who all work together to provide you with the care you need, when you need it.  We recommend signing up for the patient portal called "MyChart".  Sign up information is provided on this After Visit Summary.  MyChart is used to connect with patients for Virtual Visits (Telemedicine).  Patients are able to view lab/test results, encounter notes, upcoming appointments, etc.  Non-urgent messages can be sent to your provider as well.   To learn more about what you can do with MyChart, go to [email or web address].    Your next appointment:   6 month(s)  The format for your next appointment:   In Person  Provider:   Jonathan Branch, MD   Other Instructions None       Thank you for choosing [facility] !         

## 2021-01-17 ENCOUNTER — Encounter: Payer: Self-pay | Admitting: Cardiology

## 2021-01-17 ENCOUNTER — Ambulatory Visit (INDEPENDENT_AMBULATORY_CARE_PROVIDER_SITE_OTHER): Payer: Self-pay | Admitting: Cardiology

## 2021-01-17 VITALS — BP 150/78 | HR 94 | Ht 71.5 in | Wt 142.0 lb

## 2021-01-17 DIAGNOSIS — I5022 Chronic systolic (congestive) heart failure: Secondary | ICD-10-CM

## 2021-01-17 DIAGNOSIS — I2510 Atherosclerotic heart disease of native coronary artery without angina pectoris: Secondary | ICD-10-CM

## 2021-01-17 NOTE — Patient Instructions (Addendum)

## 2021-01-17 NOTE — Progress Notes (Signed)
Clinical Summary Craig Perry is a 60 y.o.male seen today for follow up of the following medical problems.      1. CAD -  Jan 2018  inferior STEMI s/p DES to RCA with residual LAD disease 05/2017 nuclear stress: large inreior infarct, no ischemia. High risk due to LVEF 30%   - poor compliance with meds - no recent chest pains. No SOB or DOE.   - no recent chest pains - compliant with meds   2. Chronic systolic HF - Jan 2018 echo LVEF 25-30%, diffuse hypokinesis, grade I diastolic dysfunction  - has not been on ACEI/ARB/ARNI/aldactone due to soft bp's  - 03/2020 echo LVEF 40-45%     - Some SOB he attributes to smoking, some coughing - no recent edema         3. EtOH abuse    4. Medication noncompliance prevoiusly he stopped his brillinta after his previous stent after 6 months on his own.     5. Carotid stenosis with CVA - left CEA 03/10/20 - followed by vascular    Past Medical History:  Diagnosis Date   Alcohol abuse    CAD (coronary artery disease)    a. 2014 cath with non obst CAD, EF 35%.    b. 04/2016: inferior STEMI s/p DES to RCA. 70% LAD with no intervention.   CHF (congestive heart failure) (HCC)    Cocaine abuse (HCC)    History of noncompliance with medical treatment, presenting hazards to health    Hypertension    Myocardial infarction (HCC)    Pneumothorax    Stroke (HCC)      No Known Allergies   Current Outpatient Medications  Medication Sig Dispense Refill   aspirin EC 81 MG tablet Take 1 tablet (81 mg total) by mouth daily. Swallow whole. 30 tablet 2   atorvastatin (LIPITOR) 40 MG tablet Take 1 tablet (40 mg total) by mouth daily. 30 tablet 2   carvedilol (COREG) 3.125 MG tablet Take 1 tablet (3.125 mg total) by mouth 2 (two) times daily. 60 tablet 2   No current facility-administered medications for this visit.     Past Surgical History:  Procedure Laterality Date   ABDOMINAL SURGERY     CARDIAC CATHETERIZATION N/A  04/20/2016   Procedure: Left Heart Cath and Coronary Angiography;  Surgeon: Marykay Lex, MD;  Location: [facility] INVASIVE CV LAB;  Service: Cardiovascular;  Laterality: N/A;   CARDIAC CATHETERIZATION N/A 04/20/2016   Procedure: Coronary Stent Intervention;  Surgeon: Marykay Lex, MD;  Location: [facility] INVASIVE CV LAB;  Service: Cardiovascular;  Laterality: N/A;   COLON SURGERY     ENDARTERECTOMY Left 03/10/2020   Procedure: LEFT CAROTID ENDARTERECTOMY WITH PATCH ANGIOPLASTY;  Surgeon: Larina Earthly, MD;  Location: [facility] OR;  Service: Vascular;  Laterality: Left;   LEFT HEART CATHETERIZATION WITH CORONARY ANGIOGRAM N/A 03/08/2013   Procedure: LEFT HEART CATHETERIZATION WITH CORONARY ANGIOGRAM;  Surgeon: Micheline Chapman, MD;  Location: [facility] CATH LAB;  Service: Cardiovascular;  Laterality: N/A;   LIVER SURGERY     SPLENECTOMY       No Known Allergies    Family History  Problem Relation Age of Onset   Heart attack Mother    Diabetes Mother    Stroke Mother    Heart attack Father    Diabetes Father      Social History Mr. Gipe reports that he has been smoking cigarettes. He has a 90.00 pack-year smoking history. He has  never used smokeless tobacco. Mr. Knappenberger reports current alcohol use of about 6.0 standard drinks per week.   Review of Systems CONSTITUTIONAL: No weight loss, fever, chills, weakness or fatigue.  HEENT: Eyes: No visual loss, blurred vision, double vision or yellow sclerae.No hearing loss, sneezing, congestion, runny nose or sore throat.  SKIN: No rash or itching.  CARDIOVASCULAR: per hpi RESPIRATORY: No shortness of breath, cough or sputum.  GASTROINTESTINAL: No anorexia, nausea, vomiting or diarrhea. No abdominal pain or blood.  GENITOURINARY: No burning on urination, no polyuria NEUROLOGICAL: No headache, dizziness, syncope, paralysis, ataxia, numbness or tingling in the extremities. No change in bowel or bladder control.  MUSCULOSKELETAL: No muscle, back pain,  joint pain or stiffness.  LYMPHATICS: No enlarged nodes. No history of splenectomy.  PSYCHIATRIC: No history of depression or anxiety.  ENDOCRINOLOGIC: No reports of sweating, cold or heat intolerance. No polyuria or polydipsia.  Marland Kitchen   Physical Examination Today's Vitals   01/17/21 1541  BP: (!) 150/78  Pulse: 94  SpO2: 100%  Weight: 142 lb (64.4 kg)  Height: 5' 11.5" (1.816 m)   Body mass index is 19.53 kg/m.  Gen: resting comfortably, no acute distress HEENT: no scleral icterus, pupils equal round and reactive, no palptable cervical adenopathy,  CV: RRR, no m/r/g no jvd Resp: Clear to auscultation bilaterally GI: abdomen is soft, non-tender, non-distended, normal bowel sounds, no hepatosplenomegaly MSK: extremities are warm, no edema.  Skin: warm, no rash Neuro:  no focal deficits Psych: appropriate affect   Diagnostic Studies  10/2017 echo Study Conclusions   - Left ventricle: The cavity size was normal. Wall thickness was   increased in a pattern of mild LVH. Systolic function was   moderately to severely reduced. The estimated ejection fraction   was = 35%. The study is not technically sufficient to allow   evaluation of LV diastolic function. - Aortic valve: Mildly calcified annulus. Normal thickness   leaflets. Valve area (VTI): 2.17 cm^2. Valve area (Vmax): 2.8   cm^2. - Atrial septum: No defect or patent foramen ovale was identified. - Technically difficult study.   03/2020 echo IMPRESSIONS     1. Left ventricular ejection fraction, by estimation, is 40 to 45%. The  left ventricle has mildly decreased function. The left ventricle  demonstrates global hypokinesis. Left ventricular diastolic parameters  were normal.   2. Right ventricular systolic function is normal. The right ventricular  size is normal.   3. The mitral valve is normal in structure. No evidence of mitral valve  regurgitation. No evidence of mitral stenosis.   4. The aortic valve has an  indeterminant number of cusps. Aortic valve  regurgitation is not visualized. No aortic stenosis is present.   5. The inferior vena cava is normal in size with greater than 50%  respiratory variability, suggesting right atrial pressure of 3 mmHg.    Assessment and Plan  1. CAD - poor compliance with DAPT after previous stent, higher threshold in the future for cath.  - no recent symptoms, continue current meds   2. Chronic systolic HF - management has been complicated by poor compliance - has not been on ACE/ARB/ARNI/aldactone due to intermittent soft bps - ideally would be on entresto, SGL2i. He just has not been consistently compliant with meds to start these and to safely monitor him on them,  and also is self pay so cost is an issue. Continue current regimen, if consistent with meds overtime and bp's remain stable could consider starting.  Antoine Poche, M.D.,

## 2021-02-03 ENCOUNTER — Encounter (HOSPITAL_COMMUNITY): Payer: Self-pay | Admitting: *Deleted

## 2021-02-03 ENCOUNTER — Emergency Department (HOSPITAL_COMMUNITY): Payer: Self-pay

## 2021-02-03 ENCOUNTER — Inpatient Hospital Stay (HOSPITAL_COMMUNITY)
Admission: EM | Admit: 2021-02-03 | Discharge: 2021-02-03 | DRG: 065 | Disposition: A | Discharge status: Home / Self Care | Payer: Self-pay | Attending: Internal Medicine | Admitting: Internal Medicine

## 2021-02-03 ENCOUNTER — Other Ambulatory Visit: Payer: Self-pay

## 2021-02-03 DIAGNOSIS — Z8673 Personal history of transient ischemic attack (TIA), and cerebral infarction without residual deficits: Secondary | ICD-10-CM

## 2021-02-03 DIAGNOSIS — Z9081 Acquired absence of spleen: Secondary | ICD-10-CM

## 2021-02-03 DIAGNOSIS — F1410 Cocaine abuse, uncomplicated: Secondary | ICD-10-CM | POA: Diagnosis present

## 2021-02-03 DIAGNOSIS — Z955 Presence of coronary angioplasty implant and graft: Secondary | ICD-10-CM

## 2021-02-03 DIAGNOSIS — I110 Hypertensive heart disease with heart failure: Secondary | ICD-10-CM | POA: Diagnosis present

## 2021-02-03 DIAGNOSIS — I2510 Atherosclerotic heart disease of native coronary artery without angina pectoris: Secondary | ICD-10-CM | POA: Diagnosis present

## 2021-02-03 DIAGNOSIS — I252 Old myocardial infarction: Secondary | ICD-10-CM

## 2021-02-03 DIAGNOSIS — Z20822 Contact with and (suspected) exposure to covid-19: Secondary | ICD-10-CM | POA: Diagnosis present

## 2021-02-03 DIAGNOSIS — G8324 Monoplegia of upper limb affecting left nondominant side: Secondary | ICD-10-CM | POA: Diagnosis present

## 2021-02-03 DIAGNOSIS — I639 Cerebral infarction, unspecified: Principal | ICD-10-CM

## 2021-02-03 DIAGNOSIS — Z79899 Other long term (current) drug therapy: Secondary | ICD-10-CM

## 2021-02-03 DIAGNOSIS — Z823 Family history of stroke: Secondary | ICD-10-CM

## 2021-02-03 DIAGNOSIS — M542 Cervicalgia: Secondary | ICD-10-CM

## 2021-02-03 DIAGNOSIS — R200 Anesthesia of skin: Secondary | ICD-10-CM

## 2021-02-03 DIAGNOSIS — I5022 Chronic systolic (congestive) heart failure: Secondary | ICD-10-CM | POA: Diagnosis present

## 2021-02-03 DIAGNOSIS — Z833 Family history of diabetes mellitus: Secondary | ICD-10-CM

## 2021-02-03 DIAGNOSIS — Z9114 Patient's other noncompliance with medication regimen: Secondary | ICD-10-CM

## 2021-02-03 DIAGNOSIS — Z7982 Long term (current) use of aspirin: Secondary | ICD-10-CM

## 2021-02-03 DIAGNOSIS — Z8249 Family history of ischemic heart disease and other diseases of the circulatory system: Secondary | ICD-10-CM

## 2021-02-03 DIAGNOSIS — F17210 Nicotine dependence, cigarettes, uncomplicated: Secondary | ICD-10-CM | POA: Diagnosis present

## 2021-02-03 LAB — DIFFERENTIAL
Abs Immature Granulocytes: 0.01 10*3/uL (ref 0.00–0.07)
Basophils Absolute: 0 10*3/uL (ref 0.0–0.1)
Basophils Relative: 1 %
Eosinophils Absolute: 0.2 10*3/uL (ref 0.0–0.5)
Eosinophils Relative: 3 %
Immature Granulocytes: 0 %
Lymphocytes Relative: 13 %
Lymphs Abs: 0.7 10*3/uL (ref 0.7–4.0)
Monocytes Absolute: 0.6 10*3/uL (ref 0.1–1.0)
Monocytes Relative: 12 %
Neutro Abs: 3.7 10*3/uL (ref 1.7–7.7)
Neutrophils Relative %: 71 %

## 2021-02-03 LAB — URINALYSIS, ROUTINE W REFLEX MICROSCOPIC
Bilirubin Urine: NEGATIVE
Glucose, UA: NEGATIVE mg/dL
Hgb urine dipstick: NEGATIVE
Ketones, ur: NEGATIVE mg/dL
Leukocytes,Ua: NEGATIVE
Nitrite: NEGATIVE
Protein, ur: NEGATIVE mg/dL
Specific Gravity, Urine: 1.041 — ABNORMAL HIGH (ref 1.005–1.030)
pH: 7 (ref 5.0–8.0)

## 2021-02-03 LAB — APTT: aPTT: 31 seconds (ref 24–36)

## 2021-02-03 LAB — I-STAT CHEM 8, ED
BUN: 16 mg/dL (ref 8–23)
Calcium, Ion: 1.2 mmol/L (ref 1.15–1.40)
Chloride: 99 mmol/L (ref 98–111)
Creatinine, Ser: 1 mg/dL (ref 0.61–1.24)
Glucose, Bld: 114 mg/dL — ABNORMAL HIGH (ref 70–99)
HCT: 32 % — ABNORMAL LOW (ref 39.0–52.0)
Hemoglobin: 10.9 g/dL — ABNORMAL LOW (ref 13.0–17.0)
Potassium: 3.9 mmol/L (ref 3.5–5.1)
Sodium: 137 mmol/L (ref 135–145)
TCO2: 28 mmol/L (ref 22–32)

## 2021-02-03 LAB — COMPREHENSIVE METABOLIC PANEL
ALT: 11 U/L (ref 0–44)
AST: 23 U/L (ref 15–41)
Albumin: 4.2 g/dL (ref 3.5–5.0)
Alkaline Phosphatase: 75 U/L (ref 38–126)
Anion gap: 8 (ref 5–15)
BUN: 18 mg/dL (ref 8–23)
CO2: 27 mmol/L (ref 22–32)
Calcium: 9.1 mg/dL (ref 8.9–10.3)
Chloride: 100 mmol/L (ref 98–111)
Creatinine, Ser: 0.99 mg/dL (ref 0.61–1.24)
GFR, Estimated: >60 mL/min (ref >60)
Glucose, Bld: 118 mg/dL — ABNORMAL HIGH (ref 70–99)
Potassium: 3.8 mmol/L (ref 3.5–5.1)
Sodium: 135 mmol/L (ref 135–145)
Total Bilirubin: 0.5 mg/dL (ref 0.3–1.2)
Total Protein: 7.2 g/dL (ref 6.5–8.1)

## 2021-02-03 LAB — RESP PANEL BY RT-PCR (FLU A&B, COVID) ARPGX2
Influenza A by PCR: NEGATIVE
Influenza B by PCR: NEGATIVE
SARS Coronavirus 2 by RT PCR: NEGATIVE

## 2021-02-03 LAB — CBC
HCT: 29.7 % — ABNORMAL LOW (ref 39.0–52.0)
Hemoglobin: 8.6 g/dL — ABNORMAL LOW (ref 13.0–17.0)
MCH: 20.4 pg — ABNORMAL LOW (ref 26.0–34.0)
MCHC: 29 g/dL — ABNORMAL LOW (ref 30.0–36.0)
MCV: 70.4 fL — ABNORMAL LOW (ref 80.0–100.0)
Platelets: 232 10*3/uL (ref 150–400)
RBC: 4.22 MIL/uL (ref 4.22–5.81)
RDW: 17.8 % — ABNORMAL HIGH (ref 11.5–15.5)
WBC: 5.2 10*3/uL (ref 4.0–10.5)
nRBC: 0 % (ref 0.0–0.2)

## 2021-02-03 LAB — RAPID URINE DRUG SCREEN, HOSP PERFORMED
Amphetamines: NOT DETECTED
Barbiturates: NOT DETECTED
Benzodiazepines: NOT DETECTED
Cocaine: POSITIVE — AB
Opiates: NOT DETECTED
Tetrahydrocannabinol: NOT DETECTED

## 2021-02-03 LAB — PROTIME-INR
INR: 1 (ref 0.8–1.2)
Prothrombin Time: 13.6 seconds (ref 11.4–15.2)

## 2021-02-03 LAB — ETHANOL: Alcohol, Ethyl (B): <10 mg/dL (ref <10)

## 2021-02-03 LAB — POC OCCULT BLOOD, ED: Fecal Occult Bld: NEGATIVE

## 2021-02-03 MED ORDER — IOHEXOL 350 MG/ML SOLN
75.0000 mL | Freq: Once | INTRAVENOUS | Status: AC | PRN
  Administered 2021-02-03: 75 mL via INTRAVENOUS

## 2021-02-03 NOTE — ED Notes (Signed)
Patient transported to CT 

## 2021-02-03 NOTE — ED Notes (Signed)
Pt wanting to leave. PA in with pt. Nad.

## 2021-02-03 NOTE — ED Provider Notes (Addendum)
[facility] EMERGENCY DEPARTMENT Provider Note   CSN: [id number] Arrival date & time: 02/03/21  1191     History No chief complaint on file.   Craig Perry is a 60 y.o. male.  The history is provided by the patient and medical records. No language interpreter was used.   60 year old male with history of prior stroke, prior MI, CHF, CAD, hypertension, polysubstance abuse, depression, medication noncompliance presenting complaining of left arm numbness.  Patient report he was last known normal approximately 8 PM last night.  This morning when he woke up he noticed numbness sensation to his left arm and also felt the left side of his neck is a bit more swollen than usual.  He is unable to differentiate between complete loss of sensation to his left arm versus pins and needle sensation.  He does not complain of any significant weakness about the arm.  No headache vision changes confusion chest pain trouble breathing abdominal pain neck pain or back pain.  Does admits to consuming 4 beers yesterday and admits to using cocaine yesterday as well.  He also mention recently performed some heavy lifting which he attributes to his discomfort.  He is right-hand dominant.  He has prior left endarterectomy.  He is not on any blood thinner medication.  Past Medical History:  Diagnosis Date   Alcohol abuse    CAD (coronary artery disease)    a. 2014 cath with non obst CAD, EF 35%.    b. 04/2016: inferior STEMI s/p DES to RCA. 70% LAD with no intervention.   CHF (congestive heart failure) (HCC)    Cocaine abuse (HCC)    History of noncompliance with medical treatment, presenting hazards to health    Hypertension    Myocardial infarction [place])    Pneumothorax    Stroke [facility])     Patient Active Problem List   Diagnosis Date Noted   Cocaine abuse (HCC) 03/07/2020   Chronic HFrEF (heart failure with reduced ejection fraction) /--- systolic dysfunction CHF 03/07/2020   Acute stroke due to occlusion  of left middle cerebral artery (HCC) 03/06/2020   Aphasia 03/06/2020   Acute CVA (cerebrovascular accident) (HCC) 03/06/2020   Angina pectoris (HCC) 11/18/2017   Alcohol use disorder, moderate, dependence (HCC) 07/22/2017   Major depressive disorder, recurrent severe without psychotic features (HCC) 07/21/2017   CAD (coronary artery disease) 06/16/2017   Acute on chronic combined systolic and diastolic CHF (congestive heart failure) (HCC)    Hypertension    ST elevation myocardial infarction (STEMI) of inferior wall, initial episode of care [facility]): Type I MI 04/20/2016   Alcoholic cardiomyopathy (HCC) 47/82/9562   Chest pain 03/07/2013   Tobacco abuse 03/07/2013   ETOH abuse 03/07/2013   Unintentional weight loss 03/07/2013    Past Surgical History:  Procedure Laterality Date   ABDOMINAL SURGERY     CARDIAC CATHETERIZATION N/A 04/20/2016   Procedure: Left Heart Cath and Coronary Angiography;  Surgeon: Marykay Lex, MD;  Location: [facility] INVASIVE CV LAB;  Service: Cardiovascular;  Laterality: N/A;   CARDIAC CATHETERIZATION N/A 04/20/2016   Procedure: Coronary Stent Intervention;  Surgeon: Marykay Lex, MD;  Location: [facility] INVASIVE CV LAB;  Service: Cardiovascular;  Laterality: N/A;   COLON SURGERY     ENDARTERECTOMY Left 03/10/2020   Procedure: LEFT CAROTID ENDARTERECTOMY WITH PATCH ANGIOPLASTY;  Surgeon: Larina Earthly, MD;  Location: [facility] OR;  Service: Vascular;  Laterality: Left;   LEFT HEART CATHETERIZATION WITH CORONARY ANGIOGRAM N/A 03/08/2013  Procedure: LEFT HEART CATHETERIZATION WITH CORONARY ANGIOGRAM;  Surgeon: Micheline Chapman, MD;  Location: [facility] CATH LAB;  Service: Cardiovascular;  Laterality: N/A;   LIVER SURGERY     SPLENECTOMY         Family History  Problem Relation Age of Onset   Heart attack Mother    Diabetes Mother    Stroke Mother    Heart attack Father    Diabetes Father     Social History   Tobacco Use   Smoking status: Every Day    Packs/day: 2.00     Years: 45.00    Pack years: 90.00    Types: Cigarettes   Smokeless tobacco: Never  Vaping Use   Vaping Use: Former  Substance Use Topics   Alcohol use: Yes    Alcohol/week: 6.0 standard drinks    Types: 6 Cans of beer per week    Comment: 6 beers a day.  formerly 18/day   Drug use: Yes    Types: Cocaine    Home Medications Prior to Admission medications   Medication Sig Start Date End Date Taking? Authorizing Provider  aspirin EC 81 MG tablet Take 1 tablet (81 mg total) by mouth daily. Swallow whole. 03/11/20   Osvaldo Shipper, MD  atorvastatin (LIPITOR) 40 MG tablet Take 1 tablet (40 mg total) by mouth daily. 03/11/20 06/09/20  Osvaldo Shipper, MD  carvedilol (COREG) 3.125 MG tablet Take 1 tablet (3.125 mg total) by mouth 2 (two) times daily. 03/11/20 06/09/20  Osvaldo Shipper, MD    Allergies    Patient has no known allergies.  Review of Systems   Review of Systems  All other systems reviewed and are negative.  Physical Exam Updated Vital Signs There were no vitals taken for this visit.  Physical Exam Vitals and nursing note reviewed.  Constitutional:      General: He is not in acute distress.    Appearance: He is well-developed.  HENT:     Head: Atraumatic.  Eyes:     Extraocular Movements: Extraocular movements intact.     Conjunctiva/sclera: Conjunctivae normal.     Pupils: Pupils are equal, round, and reactive to light.  Cardiovascular:     Rate and Rhythm: Normal rate and regular rhythm.     Pulses: Normal pulses.     Heart sounds: Normal heart sounds.  Pulmonary:     Effort: Pulmonary effort is normal.     Breath sounds: Normal breath sounds. No wheezing, rhonchi or rales.  Abdominal:     Palpations: Abdomen is soft.  Musculoskeletal:        General: Normal range of motion.     Cervical back: Neck supple.  Skin:    Capillary Refill: Capillary refill takes less than 2 seconds.     Findings: No rash.  Neurological:     Mental Status: He is alert.      Comments: Neurologic exam:  Speech clear, pupils equal round reactive to light, extraocular movements intact  Normal peripheral visual fields Cranial nerves III through XII normal including no facial droop Follows commands, moves all extremities x4, normal strength to bilateral upper and lower extremities at all major muscle groups including grip Sensation normal to light touch  Coordination intact, no limb ataxia, finger-nose-finger normal No pronator drift Gait normal     ED Results / Procedures / Treatments   Labs (all labs ordered are listed, but only abnormal results are displayed) Labs Reviewed  CBC - Abnormal; Notable for the following components:  Result Value   Hemoglobin 8.6 (*)    HCT 29.7 (*)    MCV 70.4 (*)    MCH 20.4 (*)    MCHC 29.0 (*)    RDW 17.8 (*)    All other components within normal limits  COMPREHENSIVE METABOLIC PANEL - Abnormal; Notable for the following components:   Glucose, Bld 118 (*)    All other components within normal limits  RAPID URINE DRUG SCREEN, HOSP PERFORMED - Abnormal; Notable for the following components:   Cocaine POSITIVE (*)    All other components within normal limits  URINALYSIS, ROUTINE W REFLEX MICROSCOPIC - Abnormal; Notable for the following components:   Color, Urine STRAW (*)    Specific Gravity, Urine 1.041 (*)    All other components within normal limits  I-STAT CHEM 8, ED - Abnormal; Notable for the following components:   Glucose, Bld 114 (*)    Hemoglobin 10.9 (*)    HCT 32.0 (*)    All other components within normal limits  RESP PANEL BY RT-PCR (FLU A&B, COVID) ARPGX2  ETHANOL  PROTIME-INR  APTT  DIFFERENTIAL  OCCULT BLOOD X 1 CARD TO LAB, STOOL  POC OCCULT BLOOD, ED    EKG None  Radiology CT ANGIO HEAD NECK W WO CM  Result Date: 02/03/2021 CLINICAL DATA:  Neuro deficit, acute, stroke suspected. Left arm numbness and left neck swelling. EXAM: CT ANGIOGRAPHY HEAD AND NECK TECHNIQUE: Multidetector CT  imaging of the head and neck was performed using the standard protocol during bolus administration of intravenous contrast. Multiplanar CT image reconstructions and MIPs were obtained to evaluate the vascular anatomy. Carotid stenosis measurements (when applicable) are obtained utilizing NASCET criteria, using the distal internal carotid diameter as the denominator. CONTRAST:  54mL OMNIPAQUE IOHEXOL 350 MG/ML SOLN COMPARISON:  CTA head and neck 03/07/2020.  Head MRI 03/06/2020. FINDINGS: CT HEAD FINDINGS Brain: There is no evidence of an acute infarct, intracranial hemorrhage, mass, midline shift, or extra-axial fluid collection. There is now encephalomalacia in the left insula and left frontal lobe at the site of the acute infarcts on the prior studies. Unchanged encephalomalacia is again noted in the left frontoparietal region. There is slight ex vacuo dilatation of the left lateral ventricle. No acute intracranial hemorrhage, mass, midline shift, or extra-axial fluid collection is identified. Vascular: Calcified atherosclerosis at the skull base. Skull: No fracture or suspicious osseous lesion. Small right frontoparietal scalp lipoma. Sinuses: Mild mucosal thickening in the right maxillary sinus. Clear mastoid air cells. Orbits: Unremarkable. Review of the MIP images confirms the above findings CTA NECK FINDINGS Aortic arch: Standard 3 vessel aortic arch with a moderate amount of mixed calcified and soft plaque. No flow limiting brachiocephalic or subclavian artery stenosis. 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 Similar appearance of soft and calcified plaque in the proximal ICA with less than 50% stenosis relative to the more distal vessel. Left carotid system: Patent with calcified and soft plaque at the common carotid artery origin resulting in less than 50% stenosis. Interval endarterectomy with wide  patency of the carotid bifurcation and ICA. Vertebral arteries: The vertebral arteries are patent and codominant with scattered V1 and V2 segment irregularity bilaterally due to atherosclerotic plaque and degenerative cervical spine spurring. No flow limiting stenosis or evidence of dissection. Skeleton: Moderate cervical disc and facet degeneration. Other neck: No evidence of cervical lymphadenopathy or mass. Upper chest: Biapical pleuroparenchymal scarring  and mild-to-moderate centrilobular emphysema. Review of the MIP images confirms the above findings CTA HEAD FINDINGS Anterior circulation: The internal carotid arteries are patent from skull base to carotid termini with mild-to-moderate right and mild left-sided atherosclerotic plaque but no flow limiting stenosis. ACAs and MCAs are patent with mild branch vessel irregularity but no evidence of a proximal branch occlusion or significant proximal stenosis. No aneurysm is identified. Posterior circulation: The intracranial vertebral arteries are widely patent to the basilar. Patent PICA and SCA origins are seen bilaterally. The basilar artery is widely patent. There is a small left posterior communicating artery. Both PCAs are patent without evidence of a significant proximal stenosis. No aneurysm is identified. Venous sinuses: Patent. Anatomic variants: None. Review of the MIP images confirms the above findings IMPRESSION: 1. No evidence of acute intracranial abnormality. Chronic left MCA infarcts. 2. Wide patency of the left carotid system following interval endarterectomy. 3. 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 4. Mild intracranial atherosclerosis without flow limiting proximal stenosis. 5. Aortic Atherosclerosis (ICD10-I70.0) and Emphysema (ICD10-J43.9). Electronically Signed   By: Sebastian Ache M.D.   On: 02/03/2021 11:03   CT THORACIC SPINE WO CONTRAST  Result Date:  02/03/2021 CLINICAL DATA:  Left neck pain. Left arm numbness and left neck swelling. EXAM: CT THORACIC SPINE WITHOUT CONTRAST TECHNIQUE: Multidetector CT images of the thoracic were obtained using the standard protocol without intravenous contrast. COMPARISON:  CTA chest 03/08/2013 FINDINGS: Alignment: Normal. Vertebrae: No acute fracture or suspicious osseous lesion. Paraspinal and other soft tissues: Biapical lung scarring. Mild-to-moderate centrilobular emphysema. Aortic and coronary atherosclerosis. Disc levels: Mild-to-moderate thoracic disc degeneration, greatest in the lower thoracic spine. Relatively widespread, moderate thoracic facet arthrosis with mild-to-moderate multilevel neural foraminal stenosis, greatest bilaterally at T10-11 in T11-12. Central to right central disc protrusion at T8-9 and disc bulging at T9-10, T10-11, and T11-12 without evidence of high-grade spinal stenosis. IMPRESSION: 1. No acute osseous abnormality identified in the thoracic spine. 2. Mild-to-moderate thoracic disc and facet degeneration with mild-to-moderate multilevel neural foraminal stenosis. No high-grade spinal stenosis. 3. Aortic Atherosclerosis (ICD10-I70.0) and Emphysema (ICD10-J43.9). Electronically Signed   By: Sebastian Ache M.D.   On: 02/03/2021 11:08    Procedures .Critical Care E&M Performed by: Fayrene Helper, PA-C  Critical care provider statement:    Critical care time (minutes):  33   Critical care was necessary to treat or prevent imminent or life-threatening deterioration of the following conditions:  CNS failure or compromise   Critical care was time spent personally by me on the following activities:  Blood draw for specimens, development of treatment plan with patient or surrogate, discussions with consultants, evaluation of patient's response to treatment, examination of patient, obtaining history from patient or surrogate, ordering and performing treatments and interventions, ordering and review of  laboratory studies, ordering and review of radiographic studies, re-evaluation of patient's condition and review of old charts   I assumed direction of critical care for this patient from another provider in my specialty: no     Care discussed with: admitting provider   After initial E/M assessment, critical care services were subsequently performed that were exclusive of separately billable procedures or treatment.     Medications Ordered in ED Medications  iohexol (OMNIPAQUE) 350 MG/ML injection 75 mL (75 mLs Intravenous Contrast Given 02/03/21 1018)    ED Course  I have reviewed the triage vital signs and the nursing notes.  Pertinent labs & imaging results  that were available during my care of the patient were reviewed by me and considered in my medical decision making (see chart for details).    MDM Rules/Calculators/A&P                           BP 129/76   Pulse 81   Temp 98.4 F (36.9 C) (Oral)   Resp (!) 25   Ht 5' 11.5" (1.816 m)   Wt 64.4 kg   SpO2 100%   BMI 19.53 kg/m   Final Clinical Impression(s) / ED Diagnoses Final diagnoses:  Neck pain on left side  Left arm numbness    Rx / DC Orders ED Discharge Orders     None      Patient here with complaints of numbness sensation to his left arm.  Last known normal was 8 PM yesterday.  He is outside the stroke window.  He does not have any obvious focal neurodeficit on initial exam.  Radial pulse 2+ bilaterally.  Minimal left arm weakness compared to right.  Work-up initiated.  12:22 PM Head and neck CT angiogram obtained today without any acute finding.  Labs demonstrate a hemoglobin of 8.6 which is a drop from his prior value a year ago.  Hemoccult obtained and negative.  Appreciate consultation to neurologist Dr. Otelia Limes recommend patient to be admitted for stroke work-up which will include head and cervical spine MRI.  I suspect his symptom is likely MSK due to recent heavy lifting however due to his  significant risk factor, will consult for admission.  Care discussed with Dr. Estell Harpin.   12:35 PM Appreciate consultation from [facility] hospitalist, Dr. Sherryll Burger who agrees to see and will admit patient and anticipate patient to be transferred to [facility] for MRI as MRI is not available in the week and here at any pain.  Suspected nerve palsy causing his discomfort.   1:42 PM Hospitalist has seen and evaluated patient and notified him.  Patient desires to be discharged and does not want to go to [facility] for further stroke work-up.  Patient made aware of the risks of leaving AGAINST MEDICAL ADVICE which include undiagnosed stroke, worsening symptoms or potential death.  He voiced understanding and appears understanding of the consequences.  Encourage patient to continue taking aspirin and follow-up with his neurologist and to return if his symptoms worsen.   Fayrene Helper, PA-C 02/03/21 1344    Bethann Berkshire, MD 02/06/21 [id number]

## 2021-02-03 NOTE — H&P (Signed)
Consult Note   Craig Perry ZOX:[id number] DOB: 07/29/1960 DOA: 02/03/2021  PCP: Patient, No Pcp Per (Inactive)   Patient coming from: Home  Chief Complaint: Left arm numbness and tingling  HPI: Craig Perry is a 60 y.o. male with medical history significant for prior CVA, CAD/MI, prior left carotid endarterectomy, hypertension, polysubstance abuse with cocaine, and depression, who presented to the ED with left arm numbness and tingling with last known normal approximately 8 PM last night.  He woke up from bed with numbness to his left arm that has continued to persist.  He also felt some soreness to the left side of his neck and states that it feels like "pins-and-needles."  He denies any headache, confusion, visual changes, chest pain, shortness of breath.  He did consume up to 4 beers yesterday and also used cocaine yesterday as well.  He did lift some heavy objects recently and seems to think that his current symptoms may be related.  He is right-handed.   ED Course: Vital signs stable and CTA head and neck with no acute findings.  EDP spoke with neurology who recommends MRI head and neck, but patient refusing to stay or transfer to [facility].  He feels that the pain has improved.  Review of Systems: Reviewed as noted above, otherwise negative.  Past Medical History:  Diagnosis Date   Alcohol abuse    CAD (coronary artery disease)    a. 2014 cath with non obst CAD, EF 35%.    b. 04/2016: inferior STEMI s/p DES to RCA. 70% LAD with no intervention.   CHF (congestive heart failure) (HCC)    Cocaine abuse (HCC)    History of noncompliance with medical treatment, presenting hazards to health    Hypertension    Myocardial infarction [facility])    Pneumothorax    Stroke [facility])     Past Surgical History:  Procedure Laterality Date   ABDOMINAL SURGERY     CARDIAC CATHETERIZATION N/A 04/20/2016   Procedure: Left Heart Cath and Coronary Angiography;  Surgeon: Marykay Lex, MD;   Location: [facility] INVASIVE CV LAB;  Service: Cardiovascular;  Laterality: N/A;   CARDIAC CATHETERIZATION N/A 04/20/2016   Procedure: Coronary Stent Intervention;  Surgeon: Marykay Lex, MD;  Location: [facility] INVASIVE CV LAB;  Service: Cardiovascular;  Laterality: N/A;   COLON SURGERY     ENDARTERECTOMY Left 03/10/2020   Procedure: LEFT CAROTID ENDARTERECTOMY WITH PATCH ANGIOPLASTY;  Surgeon: Larina Earthly, MD;  Location: [facility] OR;  Service: Vascular;  Laterality: Left;   LEFT HEART CATHETERIZATION WITH CORONARY ANGIOGRAM N/A 03/08/2013   Procedure: LEFT HEART CATHETERIZATION WITH CORONARY ANGIOGRAM;  Surgeon: Micheline Chapman, MD;  Location: [facility] CATH LAB;  Service: Cardiovascular;  Laterality: N/A;   LIVER SURGERY     SPLENECTOMY       reports that he has been smoking cigarettes. He has a 90.00 pack-year smoking history. He has never used smokeless tobacco. He reports current alcohol use of about 12.0 standard drinks per week. He reports current drug use. Frequency: 1.00 time per week. Drug: Cocaine.  No Known Allergies  Family History  Problem Relation Age of Onset   Heart attack Mother    Diabetes Mother    Stroke Mother    Heart attack Father    Diabetes Father     Prior to Admission medications   Medication Sig Start Date End Date Taking? Authorizing Provider  aspirin EC 81 MG tablet Take 1 tablet (81 mg total) by  mouth daily. Swallow whole. 03/11/20   Osvaldo Shipper, MD  atorvastatin (LIPITOR) 40 MG tablet Take 1 tablet (40 mg total) by mouth daily. 03/11/20 06/09/20  Osvaldo Shipper, MD  carvedilol (COREG) 3.125 MG tablet Take 1 tablet (3.125 mg total) by mouth 2 (two) times daily. 03/11/20 06/09/20  Osvaldo Shipper, MD    Physical Exam: Vitals:   02/03/21 0930 02/03/21 1000 02/03/21 1030 02/03/21 1100  BP: 100/73 106/69 126/78 129/76  Pulse: 95 87 85 81  Resp: 18 20 18  (!) 25  Temp:      TempSrc:      SpO2: 99% 97% 100% 100%  Weight:      Height:        Constitutional: NAD,  calm, comfortable Vitals:   02/03/21 0930 02/03/21 1000 02/03/21 1030 02/03/21 1100  BP: 100/73 106/69 126/78 129/76  Pulse: 95 87 85 81  Resp: 18 20 18  (!) 25  Temp:      TempSrc:      SpO2: 99% 97% 100% 100%  Weight:      Height:       Eyes: lids and conjunctivae normal Neck: normal, supple Respiratory: clear to auscultation bilaterally. Normal respiratory effort. No accessory muscle use.  Cardiovascular: Regular rate and rhythm, no murmurs. Abdomen: no tenderness, no distention. Bowel sounds positive.  Musculoskeletal:  No edema. Skin: no rashes, lesions, ulcers.  Psychiatric: Flat affect  Labs on Admission: I have personally reviewed following labs and imaging studies  CBC: Recent Labs  Lab 02/03/21 0940 02/03/21 0952  WBC 5.2  --   NEUTROABS 3.7  --   HGB 8.6* 10.9*  HCT 29.7* 32.0*  MCV 70.4*  --   PLT 232  --    Basic Metabolic Panel: Recent Labs  Lab 02/03/21 0940 02/03/21 0952  NA 135 137  K 3.8 3.9  CL 100 99  CO2 27  --   GLUCOSE 118* 114*  BUN 18 16  CREATININE 0.99 1.00  CALCIUM 9.1  --    GFR: Estimated Creatinine Clearance: 69.8 mL/min (by C-G formula based on SCr of 1 mg/dL). Liver Function Tests: Recent Labs  Lab 02/03/21 0940  AST 23  ALT 11  ALKPHOS 75  BILITOT 0.5  PROT 7.2  ALBUMIN 4.2   No results for input(s): LIPASE, AMYLASE in the last 168 hours. No results for input(s): AMMONIA in the last 168 hours. Coagulation Profile: Recent Labs  Lab 02/03/21 0940  INR 1.0   Cardiac Enzymes: No results for input(s): CKTOTAL, CKMB, CKMBINDEX, TROPONINI in the last 168 hours. BNP (last 3 results) No results for input(s): PROBNP in the last 8760 hours. HbA1C: No results for input(s): HGBA1C in the last 72 hours. CBG: No results for input(s): GLUCAP in the last 168 hours. Lipid Profile: No results for input(s): CHOL, HDL, LDLCALC, TRIG, CHOLHDL, LDLDIRECT in the last 72 hours. Thyroid Function Tests: No results for input(s):  TSH, T4TOTAL, FREET4, T3FREE, THYROIDAB in the last 72 hours. Anemia Panel: No results for input(s): VITAMINB12, FOLATE, FERRITIN, TIBC, IRON, RETICCTPCT in the last 72 hours. Urine analysis:    Component Value Date/Time   COLORURINE STRAW (A) 02/03/2021 1117   APPEARANCEUR CLEAR 02/03/2021 1117   LABSPEC 1.041 (H) 02/03/2021 1117   PHURINE 7.0 02/03/2021 1117   GLUCOSEU NEGATIVE 02/03/2021 1117   HGBUR NEGATIVE 02/03/2021 1117   BILIRUBINUR NEGATIVE 02/03/2021 1117   KETONESUR NEGATIVE 02/03/2021 1117   PROTEINUR NEGATIVE 02/03/2021 1117   UROBILINOGEN 0.2 04/10/2013 0015   NITRITE  NEGATIVE 02/03/2021 1117   LEUKOCYTESUR NEGATIVE 02/03/2021 1117    Radiological Exams on Admission: CT ANGIO HEAD NECK W WO CM  Result Date: 02/03/2021 CLINICAL DATA:  Neuro deficit, acute, stroke suspected. Left arm numbness and left neck swelling. EXAM: CT ANGIOGRAPHY HEAD AND NECK TECHNIQUE: Multidetector CT imaging of the head and neck was performed using the standard protocol during bolus administration of intravenous contrast. Multiplanar CT image reconstructions and MIPs were obtained to evaluate the vascular anatomy. Carotid stenosis measurements (when applicable) are obtained utilizing NASCET criteria, using the distal internal carotid diameter as the denominator. CONTRAST:  6mL OMNIPAQUE IOHEXOL 350 MG/ML SOLN COMPARISON:  CTA head and neck 03/07/2020.  Head MRI 03/06/2020. FINDINGS: CT HEAD FINDINGS Brain: There is no evidence of an acute infarct, intracranial hemorrhage, mass, midline shift, or extra-axial fluid collection. There is now encephalomalacia in the left insula and left frontal lobe at the site of the acute infarcts on the prior studies. Unchanged encephalomalacia is again noted in the left frontoparietal region. There is slight ex vacuo dilatation of the left lateral ventricle. No acute intracranial hemorrhage, mass, midline shift, or extra-axial fluid collection is identified. Vascular:  Calcified atherosclerosis at the skull base. Skull: No fracture or suspicious osseous lesion. Small right frontoparietal scalp lipoma. Sinuses: Mild mucosal thickening in the right maxillary sinus. Clear mastoid air cells. Orbits: Unremarkable. Review of the MIP images confirms the above findings CTA NECK FINDINGS Aortic arch: Standard 3 vessel aortic arch with a moderate amount of mixed calcified and soft plaque. No flow limiting brachiocephalic or subclavian artery stenosis. 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 Similar appearance of soft and calcified plaque in the proximal ICA with less than 50% stenosis relative to the more distal vessel. Left carotid system: Patent with calcified and soft plaque at the common carotid artery origin resulting in less than 50% stenosis. Interval endarterectomy with wide patency of the carotid bifurcation and ICA. Vertebral arteries: The vertebral arteries are patent and codominant with scattered V1 and V2 segment irregularity bilaterally due to atherosclerotic plaque and degenerative cervical spine spurring. No flow limiting stenosis or evidence of dissection. Skeleton: Moderate cervical disc and facet degeneration. Other neck: No evidence of cervical lymphadenopathy or mass. Upper chest: Biapical pleuroparenchymal scarring and mild-to-moderate centrilobular emphysema. Review of the MIP images confirms the above findings CTA HEAD FINDINGS Anterior circulation: The internal carotid arteries are patent from skull base to carotid termini with mild-to-moderate right and mild left-sided atherosclerotic plaque but no flow limiting stenosis. ACAs and MCAs are patent with mild branch vessel irregularity but no evidence of a proximal branch occlusion or significant proximal stenosis. No aneurysm is identified. Posterior circulation: The intracranial vertebral arteries are widely  patent to the basilar. Patent PICA and SCA origins are seen bilaterally. The basilar artery is widely patent. There is a small left posterior communicating artery. Both PCAs are patent without evidence of a significant proximal stenosis. No aneurysm is identified. Venous sinuses: Patent. Anatomic variants: None. Review of the MIP images confirms the above findings IMPRESSION: 1. No evidence of acute intracranial abnormality. Chronic left MCA infarcts. 2. Wide patency of the left carotid system following interval endarterectomy. 3. 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 4. Mild intracranial atherosclerosis without flow limiting proximal stenosis. 5. Aortic Atherosclerosis (ICD10-I70.0) and Emphysema (ICD10-J43.9). Electronically Signed   By: Freida Busman  Mosetta Putt M.D.   On: 02/03/2021 11:03   CT THORACIC SPINE WO CONTRAST  Result Date: 02/03/2021 CLINICAL DATA:  Left neck pain. Left arm numbness and left neck swelling. EXAM: CT THORACIC SPINE WITHOUT CONTRAST TECHNIQUE: Multidetector CT images of the thoracic were obtained using the standard protocol without intravenous contrast. COMPARISON:  CTA chest 03/08/2013 FINDINGS: Alignment: Normal. Vertebrae: No acute fracture or suspicious osseous lesion. Paraspinal and other soft tissues: Biapical lung scarring. Mild-to-moderate centrilobular emphysema. Aortic and coronary atherosclerosis. Disc levels: Mild-to-moderate thoracic disc degeneration, greatest in the lower thoracic spine. Relatively widespread, moderate thoracic facet arthrosis with mild-to-moderate multilevel neural foraminal stenosis, greatest bilaterally at T10-11 in T11-12. Central to right central disc protrusion at T8-9 and disc bulging at T9-10, T10-11, and T11-12 without evidence of high-grade spinal stenosis. IMPRESSION: 1. No acute osseous abnormality identified in the thoracic spine. 2. Mild-to-moderate thoracic disc and  facet degeneration with mild-to-moderate multilevel neural foraminal stenosis. No high-grade spinal stenosis. 3. Aortic Atherosclerosis (ICD10-I70.0) and Emphysema (ICD10-J43.9). Electronically Signed   By: Sebastian Ache M.D.   On: 02/03/2021 11:08    EKG: Independently reviewed. SR 93bpm.  Assessment/Plan Active Problems:   CVA (cerebral vascular accident) (HCC)    Left arm numbness and tingling with suspicion of CVA versus cervical DJD -CTA head and neck with no acute findings or LVO, CT thoracic spine with thoracic DJD noted, but no high-grade spinal stenosis -History of prior CVA  -EDP has discussed with neurology with recommendations for MRI head and neck -Patient however refuses to stay in the hospital for further imaging and states that his pain symptoms have improved.  EDP will discharge AMA.  History of alcohol use and polysubstance abuse with cocaine -Counseled on cessation  History of hypertension/CAD/CVA -Continue home medications  Merlin Ege D Lonny Eisen DO [facility]  If 7PM-7AM, please contact night-coverage [email or web address]  02/03/2021, 12:37 PM

## 2021-02-03 NOTE — ED Triage Notes (Signed)
Pt c/o left arm numbness and left side of neck swelling since he woke up this morning at 0800. LKW last night at 2000. Pt reports he has surgery on his neck about a year ago to place a stent due to a stroke.

## 2021-02-03 NOTE — Discharge Instructions (Signed)
You have opted to leave AGAINST MEDICAL ADVICE.  Follow-up closely with your neurologist for further managements of your condition.  Do not hesitate to return to the ER if your symptoms progress or if you have any concern.  Make sure to take your aspirin daily as previously prescribed.

## 2021-02-03 NOTE — ED Notes (Signed)
Pt denies pain. Nad. States numbness to left upper arm is better but still present. Pt ambulated to bathroom without difficulty. PA in with pt at this time

## 2021-02-03 NOTE — ED Notes (Signed)
Pt resting. nad

## 2021-03-19 ENCOUNTER — Other Ambulatory Visit: Payer: Self-pay | Admitting: Student

## 2021-07-16 ENCOUNTER — Ambulatory Visit: Payer: Self-pay | Admitting: Cardiology

## 2021-07-16 NOTE — Progress Notes (Deleted)
? ? ? ?Clinical Summary ?Craig Perry is a 61 y.o.male ? ?seen today for follow up of the following medical problems.  ?  ?  ?1. CAD ?-  Jan 2018  inferior STEMI s/p DES to RCA with residual LAD disease ?05/2017 nuclear stress: large inreior infarct, no ischemia. High risk due to LVEF 30% ?  ?- poor compliance with meds ?- no recent chest pains. No SOB or DOE.  ?  ?- no recent chest pains ?- compliant with meds ?  ?2. Chronic systolic HF ?- Jan 2018 echo LVEF 25-30%, diffuse hypokinesis, grade I diastolic dysfunction ? - has not been on ACEI/ARB/ARNI/aldactone due to soft bp's  ?- 03/2020 echo LVEF 40-45% ?  ?  ?- Some SOB he attributes to smoking, some coughing ?- no recent edema ?  ?  ?  ?  ?3. EtOH abuse ?  ?  ?4. Medication noncompliance ?prevoiusly he stopped his brillinta after his previous stent after 6 months on his own. ?  ?  ?5. Carotid stenosis with CVA ?- left CEA 03/10/20 ?- followed by vascular ?  ?  ?Past Medical History:  ?Diagnosis Date  ? Alcohol abuse   ? CAD (coronary artery disease)   ? a. 2014 cath with non obst CAD, EF 35%.    b. 04/2016: inferior STEMI s/p DES to RCA. 70% LAD with no intervention.  ? CHF (congestive heart failure) (HCC)   ? Cocaine abuse (HCC)   ? History of noncompliance with medical treatment, presenting hazards to health   ? Hypertension   ? Myocardial infarction [facility])   ? Pneumothorax   ? Stroke [facility])   ? ? ? ?No Known Allergies ? ? ?Current Outpatient Medications  ?Medication Sig Dispense Refill  ? aspirin EC 81 MG tablet Take 1 tablet (81 mg total) by mouth daily. Swallow whole. 30 tablet 2  ? atorvastatin (LIPITOR) 40 MG tablet Take 1 tablet by mouth once daily 90 tablet 1  ? carvedilol (COREG) 3.125 MG tablet Take 1 tablet by mouth twice daily 180 tablet 1  ? ?No current facility-administered medications for this visit.  ? ? ? ?Past Surgical History:  ?Procedure Laterality Date  ? ABDOMINAL SURGERY    ? CARDIAC CATHETERIZATION N/A 04/20/2016  ? Procedure: Left Heart  Cath and Coronary Angiography;  Surgeon: Marykay Lex, MD;  Location: [facility] INVASIVE CV LAB;  Service: Cardiovascular;  Laterality: N/A;  ? CARDIAC CATHETERIZATION N/A 04/20/2016  ? Procedure: Coronary Stent Intervention;  Surgeon: Marykay Lex, MD;  Location: [facility] INVASIVE CV LAB;  Service: Cardiovascular;  Laterality: N/A;  ? COLON SURGERY    ? ENDARTERECTOMY Left 03/10/2020  ? Procedure: LEFT CAROTID ENDARTERECTOMY WITH PATCH ANGIOPLASTY;  Surgeon: Larina Earthly, MD;  Location: [facility] OR;  Service: Vascular;  Laterality: Left;  ? LEFT HEART CATHETERIZATION WITH CORONARY ANGIOGRAM N/A 03/08/2013  ? Procedure: LEFT HEART CATHETERIZATION WITH CORONARY ANGIOGRAM;  Surgeon: Micheline Chapman, MD;  Location: [facility] CATH LAB;  Service: Cardiovascular;  Laterality: N/A;  ? LIVER SURGERY    ? SPLENECTOMY    ? ? ? ?No Known Allergies ? ? ? ?Family History  ?Problem Relation Age of Onset  ? Heart attack Mother   ? Diabetes Mother   ? Stroke Mother   ? Heart attack Father   ? Diabetes Father   ? ? ? ?Social History ?Craig Perry reports that he has been smoking cigarettes. He has a 90.00 pack-year smoking history. He has never used smokeless  tobacco. ?Craig Perry reports current alcohol use of about 12.0 standard drinks per week. ? ? ?Review of Systems ?CONSTITUTIONAL: No weight loss, fever, chills, weakness or fatigue.  ?HEENT: Eyes: No visual loss, blurred vision, double vision or yellow sclerae.No hearing loss, sneezing, congestion, runny nose or sore throat.  ?SKIN: No rash or itching.  ?CARDIOVASCULAR:  ?RESPIRATORY: No shortness of breath, cough or sputum.  ?GASTROINTESTINAL: No anorexia, nausea, vomiting or diarrhea. No abdominal pain or blood.  ?GENITOURINARY: No burning on urination, no polyuria ?NEUROLOGICAL: No headache, dizziness, syncope, paralysis, ataxia, numbness or tingling in the extremities. No change in bowel or bladder control.  ?MUSCULOSKELETAL: No muscle, back pain, joint pain or stiffness.  ?LYMPHATICS: No  enlarged nodes. No history of splenectomy.  ?PSYCHIATRIC: No history of depression or anxiety.  ?ENDOCRINOLOGIC: No reports of sweating, cold or heat intolerance. No polyuria or polydipsia.  ?. ? ? ?Physical Examination ?There were no vitals filed for this visit. ?There were no vitals filed for this visit. ? ?Gen: resting comfortably, no acute distress ?HEENT: no scleral icterus, pupils equal round and reactive, no palptable cervical adenopathy,  ?CV ?Resp: Clear to auscultation bilaterally ?GI: abdomen is soft, non-tender, non-distended, normal bowel sounds, no hepatosplenomegaly ?MSK: extremities are warm, no edema.  ?Skin: warm, no rash ?Neuro:  no focal deficits ?Psych: appropriate affect ? ? ?Diagnostic Studies ?10/2017 echo ?Study Conclusions ?  ?- Left ventricle: The cavity size was normal. Wall thickness was ?  increased in a pattern of mild LVH. Systolic function was ?  moderately to severely reduced. The estimated ejection fraction ?  was = 35%. The study is not technically sufficient to allow ?  evaluation of LV diastolic function. ?- Aortic valve: Mildly calcified annulus. Normal thickness ?  leaflets. Valve area (VTI): 2.17 cm^2. Valve area (Vmax): 2.8 ?  cm^2. ?- Atrial septum: No defect or patent foramen ovale was identified. ?- Technically difficult study. ?  ?03/2020 echo ?IMPRESSIONS  ? ? ? 1. Left ventricular ejection fraction, by estimation, is 40 to 45%. The  ?left ventricle has mildly decreased function. The left ventricle  ?demonstrates global hypokinesis. Left ventricular diastolic parameters  ?were normal.  ? 2. Right ventricular systolic function is normal. The right ventricular  ?size is normal.  ? 3. The mitral valve is normal in structure. No evidence of mitral valve  ?regurgitation. No evidence of mitral stenosis.  ? 4. The aortic valve has an indeterminant number of cusps. Aortic valve  ?regurgitation is not visualized. No aortic stenosis is present.  ? 5. The inferior vena cava is  normal in size with greater than 50%  ?respiratory variability, suggesting right atrial pressure of 3 mmHg.  ? ? ? ?Assessment and Plan  ? ?1. CAD ?- poor compliance with DAPT after previous stent, higher threshold in the future for cath.  ?- no recent symptoms, continue current meds ?  ?2. Chronic systolic HF ?- management has been complicated by poor compliance ?- has not been on ACE/ARB/ARNI/aldactone due to intermittent soft bps ?- ideally would be on entresto, SGL2i. He just has not been consistently compliant with meds to start these and to safely monitor him on them,  and also is self pay so cost is an issue. Continue current regimen, if consistent with meds overtime and bp's remain stable could consider starting.  ?  ? ? ? ? ?Antoine Poche, M.D., F.A.C.C. ?

## 2023-01-27 IMAGING — CT CT T SPINE W/O CM
3 of 4 series · 12 of 33 positions shown, 14 images · non-contrast
Comparison: CTA chest 03/08/2013

CLINICAL DATA: Left neck pain. Left arm numbness and left neck
swelling.

EXAM:
CT THORACIC SPINE WITHOUT CONTRAST
TECHNIQUE: Multidetector CT images of the thoracic were obtained using the
standard protocol without intravenous contrast.

[Series 5: t spine soft · axial · 0.35mm/px · z∈[+1235,+1433]mm · 4 of 199 slices shown]
[im 34/199  soft-tissue]
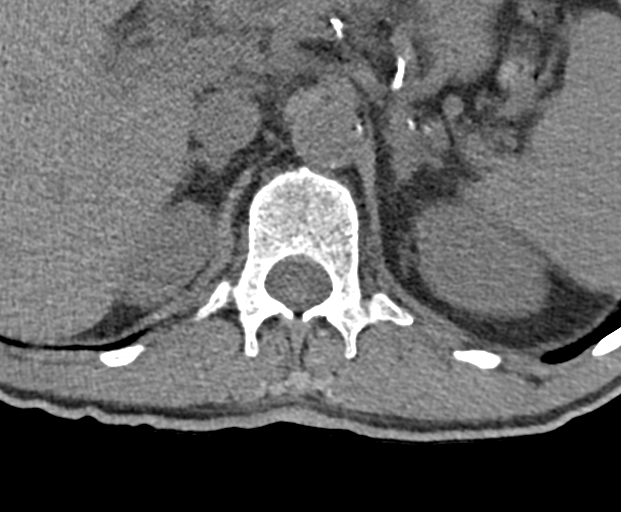
[im 67/199  soft-tissue]
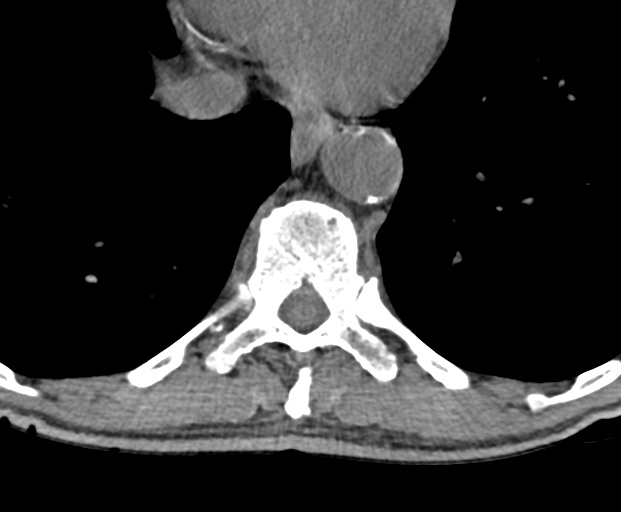
[im 100/199  soft-tissue]
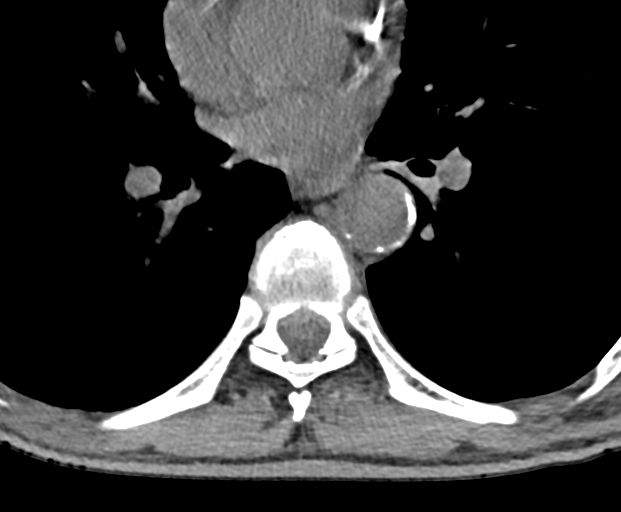
[im 133/199  soft-tissue]
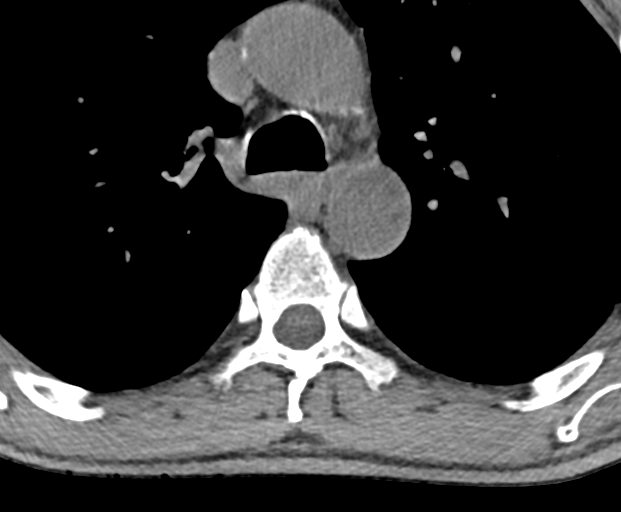

[Series 7: cor bone · coronal · 0.31mm/px · 3 of 78 slices shown]
[im 16/78  bone]
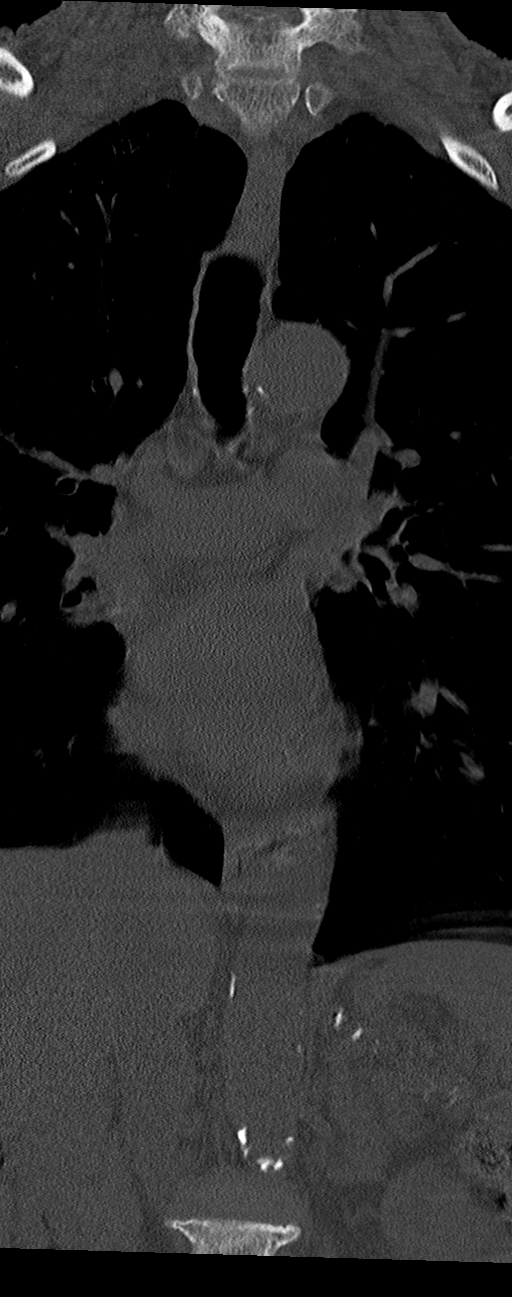
[im 31/78  bone]
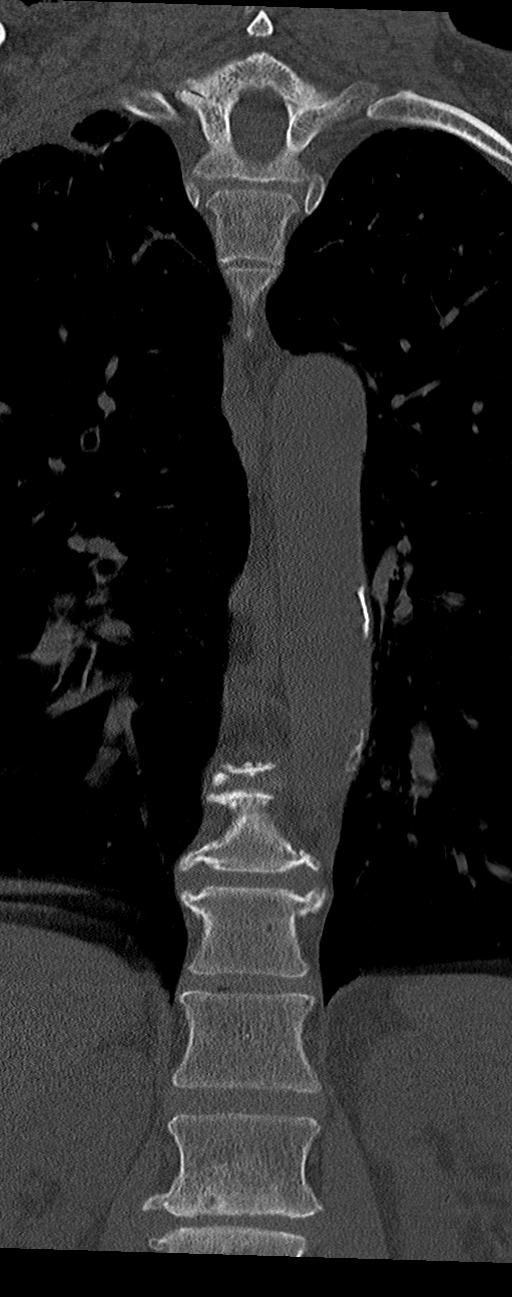
[im 47/78  bone]
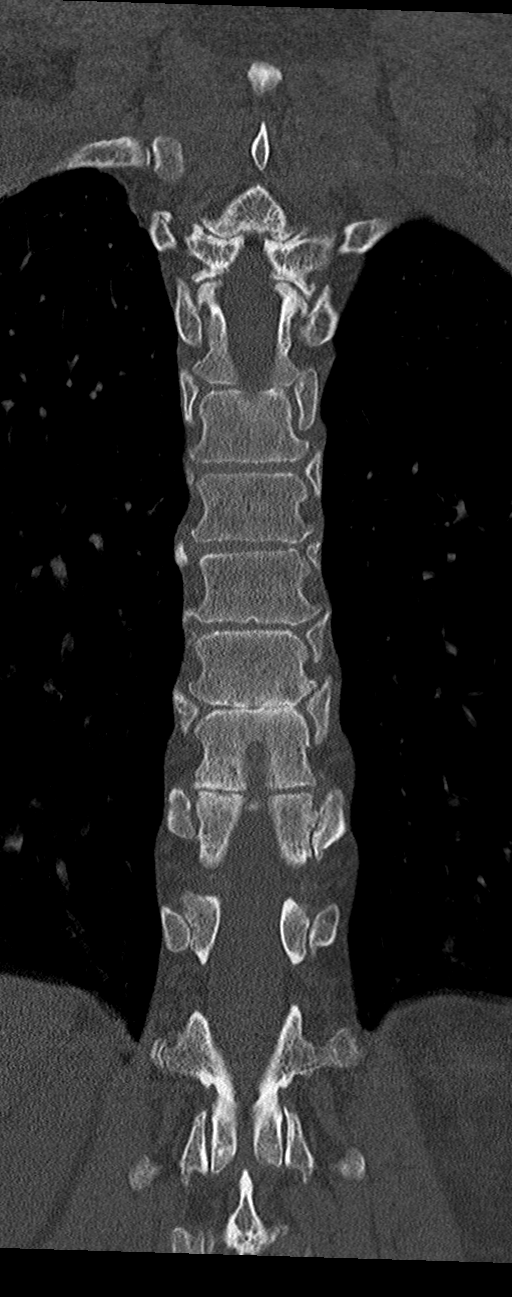

[Series 8: orthoganal tspine · axial · 0.21mm/px · z∈[+1245,+1500]mm · 5 of 208 slices shown, 7 images]
[im 35/208  soft-tissue]
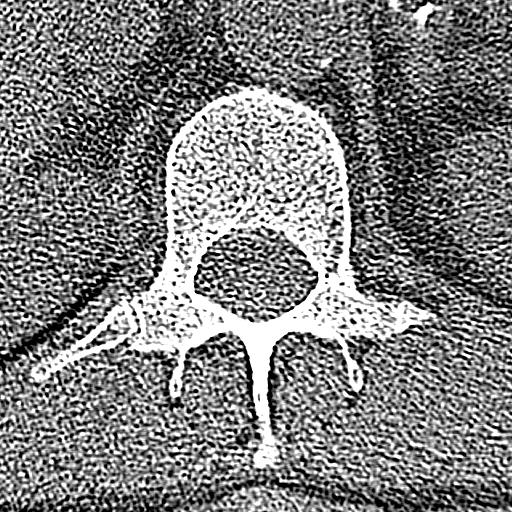
[im 35/208  bone]
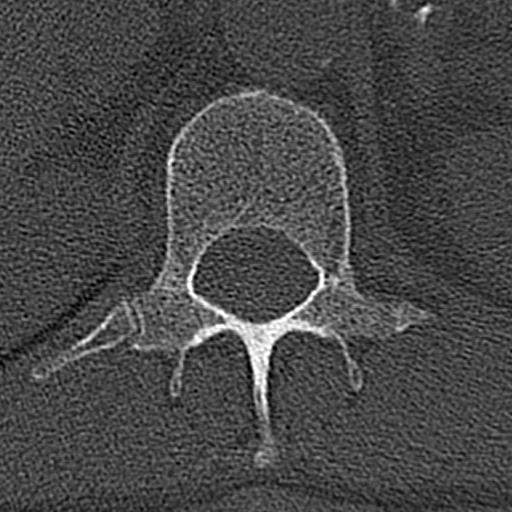
[im 70/208  bone]
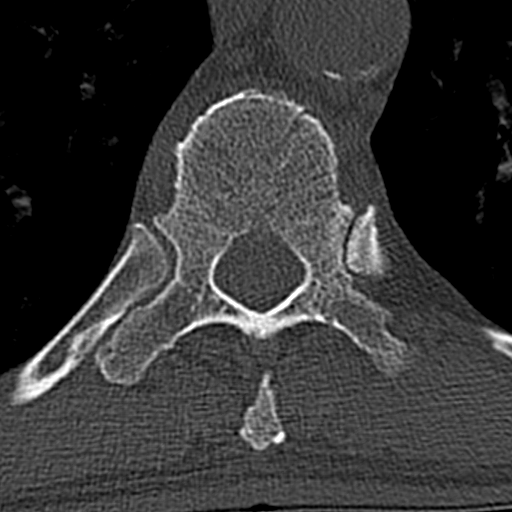
[im 104/208  bone]
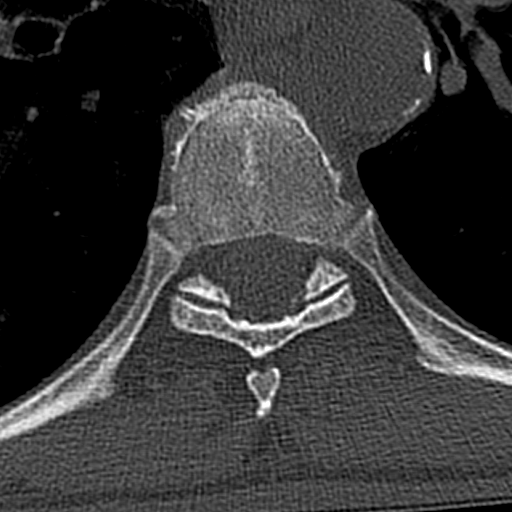
[im 139/208  bone]
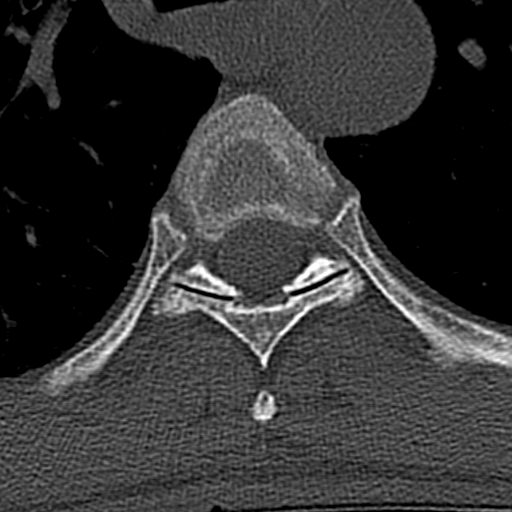
[im 173/208  soft-tissue]
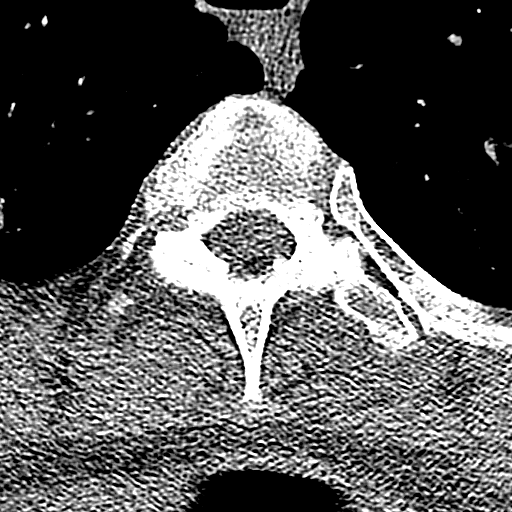
[im 173/208  bone]
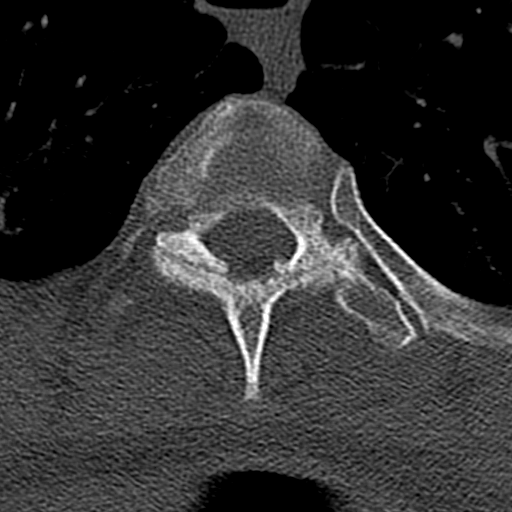

[12 of 33 positions shown; findings below may reference images not displayed]

FINDINGS: Alignment: Normal.

Vertebrae: No acute fracture or suspicious osseous lesion.

Paraspinal and other soft tissues: Biapical lung scarring.
Mild-to-moderate centrilobular emphysema. Aortic and coronary
atherosclerosis.

Disc levels: Mild-to-moderate thoracic disc degeneration, greatest
in the lower thoracic spine. Relatively widespread, moderate
thoracic facet arthrosis with mild-to-moderate multilevel neural
foraminal stenosis, greatest bilaterally at T10-11 in T11-12.
Central to right central disc protrusion at T8-9 and disc bulging at
T9-10, T10-11, and T11-12 without evidence of high-grade spinal
stenosis.
IMPRESSION: 1. No acute osseous abnormality identified in the thoracic spine.
2. Mild-to-moderate thoracic disc and facet degeneration with
mild-to-moderate multilevel neural foraminal stenosis. No high-grade
spinal stenosis.
3. Aortic Atherosclerosis ([id number]) and Emphysema ([id number]).

## 2023-01-27 IMAGING — CT CT ANGIO HEAD-NECK (W OR W/O PERF)
2 of 11 series · 11 of 47 positions shown · IV contrast (Omnipaque or Isovue)
Comparison: CTA head and neck 03/07/2020.  Head MRI 03/06/2020.

CLINICAL DATA: Neuro deficit, acute, stroke suspected. Left arm
numbness and left neck swelling.

EXAM:
CT ANGIOGRAPHY HEAD AND NECK
TECHNIQUE: Multidetector CT imaging of the head and neck was performed using
the standard protocol during bolus administration of intravenous
contrast. Multiplanar CT image reconstructions and MIPs were
obtained to evaluate the vascular anatomy. Carotid stenosis
measurements (when applicable) are obtained utilizing NASCET
criteria, using the distal internal carotid diameter as the
denominator.
CONTRAST:  75mL OMNIPAQUE IOHEXOL 350 MG/ML SOLN

[Series 11: ax thins · axial · 0.39mm/px · z∈[+1418,+1797]mm · 8 of 440 slices shown]
[im 30/440  brain]
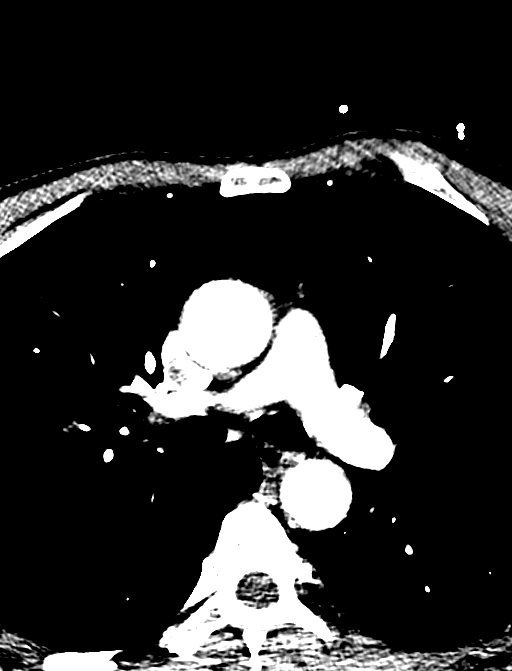
[im 88/440  bone]
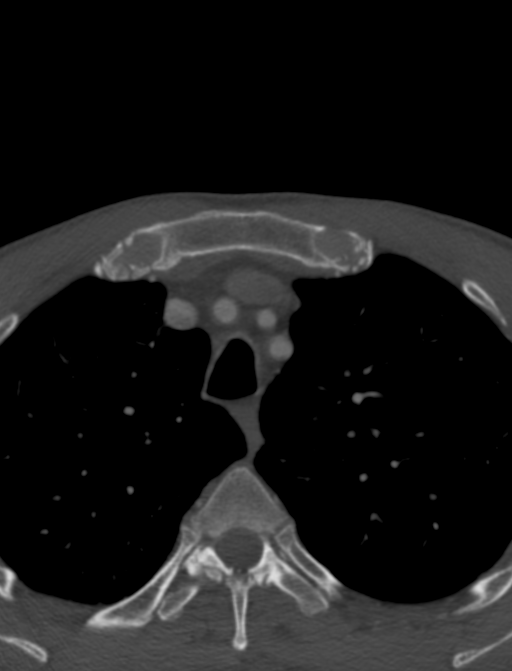
[im 147/440  brain]
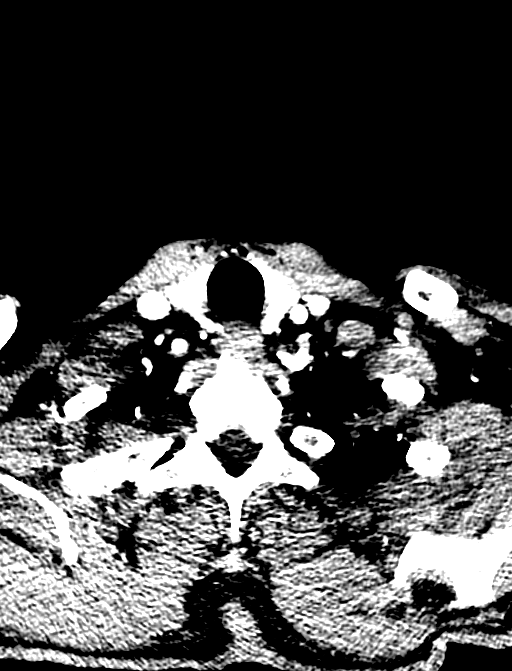
[im 205/440  bone]
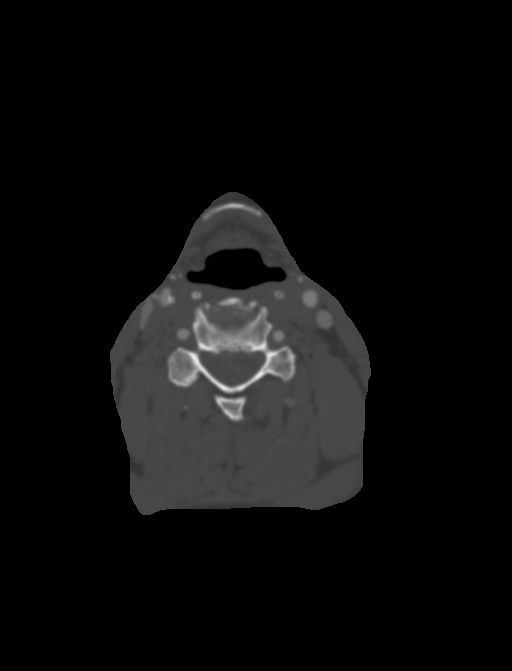
[im 235/440  brain]
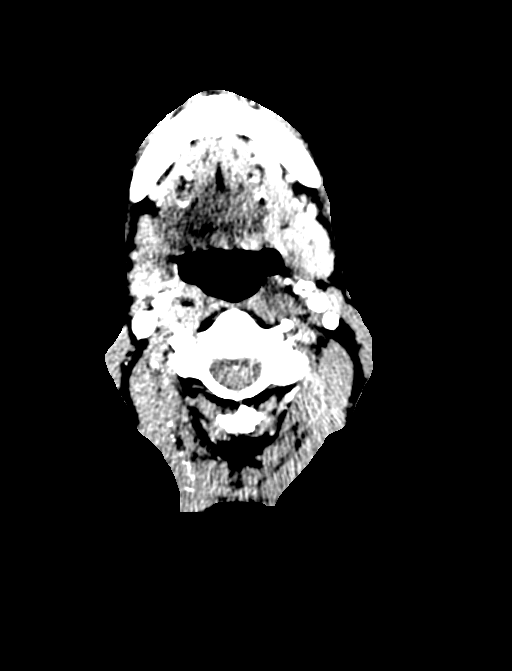
[im 293/440  bone]
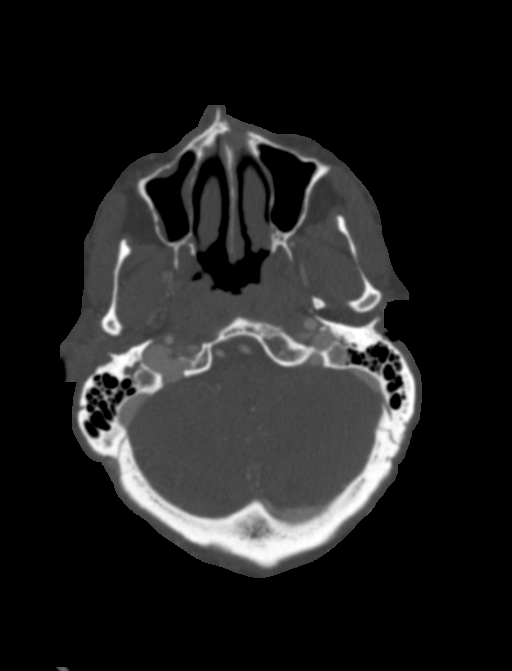
[im 352/440  brain]
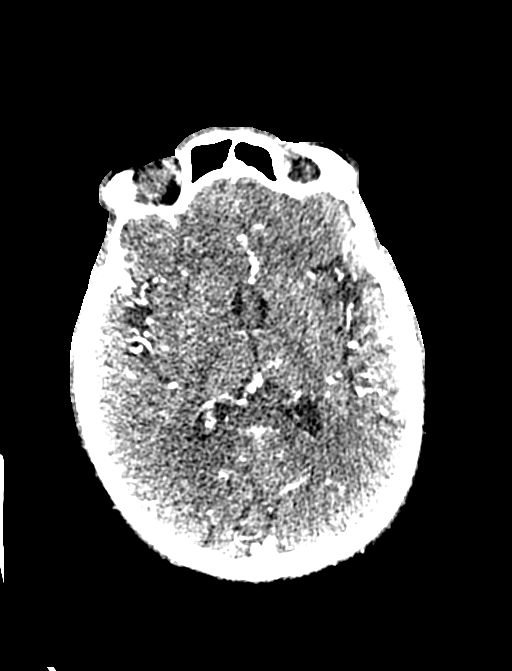
[im 410/440  bone]
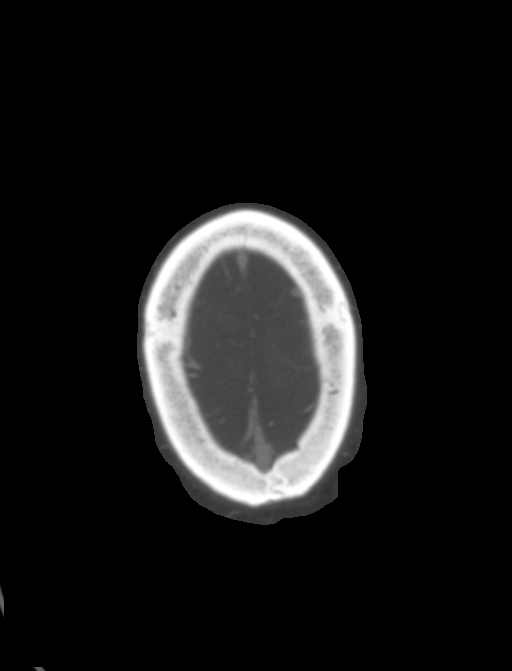

[Series 12: cor thin · coronal · 0.40mm/px · 3 of 253 slices shown]
[im 85/253  brain]
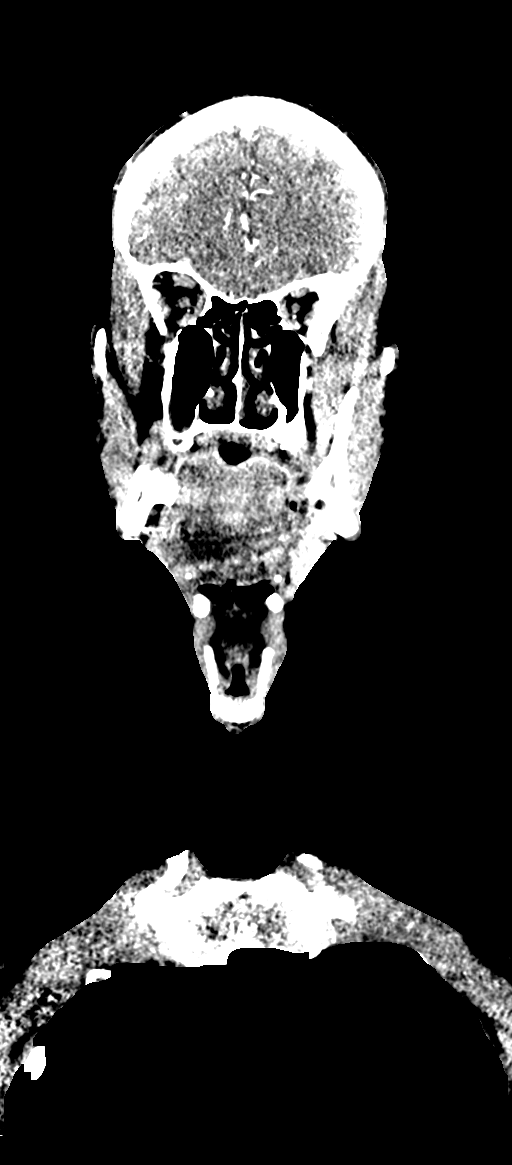
[im 127/253  brain]
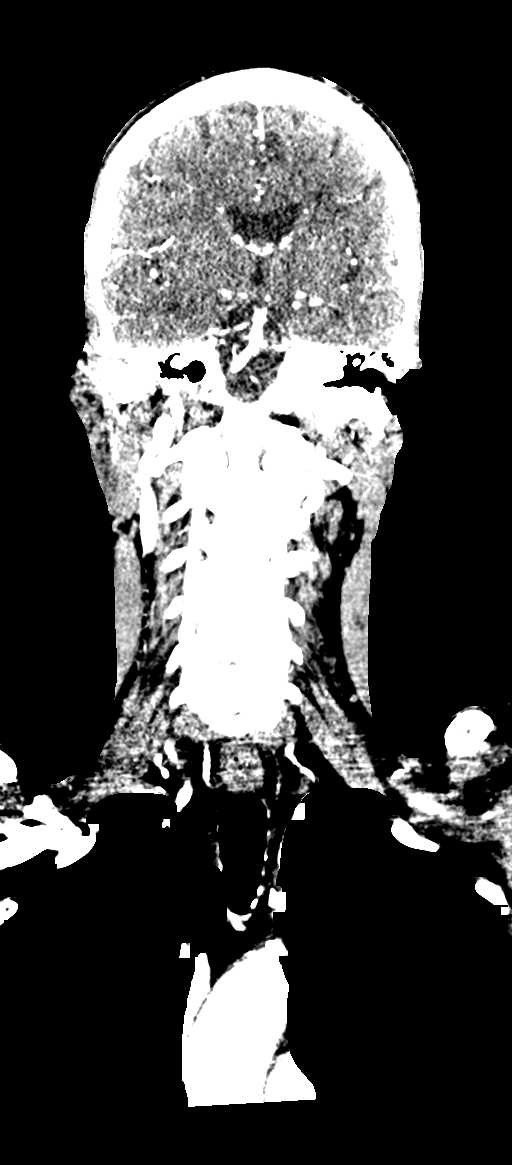
[im 169/253  brain]
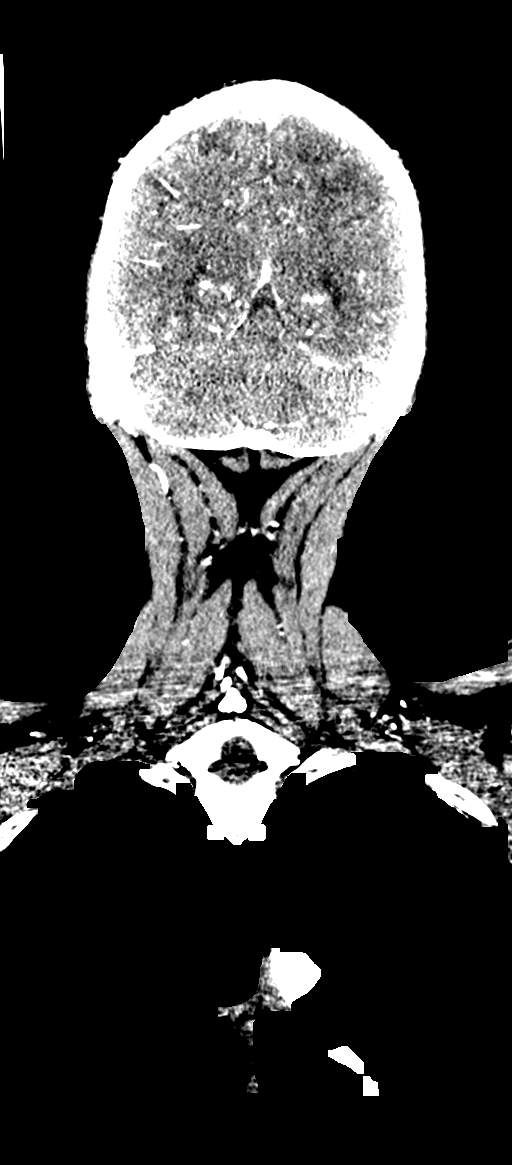

[11 of 47 positions shown; findings below may reference images not displayed]

FINDINGS: CT HEAD FINDINGS

Brain: There is no evidence of an acute infarct, intracranial
hemorrhage, mass, midline shift, or extra-axial fluid collection.
There is now encephalomalacia in the left insula and left frontal
lobe at the site of the acute infarcts on the prior studies.
Unchanged encephalomalacia is again noted in the left frontoparietal
region. There is slight ex vacuo dilatation of the left lateral
ventricle. No acute intracranial hemorrhage, mass, midline shift, or
extra-axial fluid collection is identified.

Vascular: Calcified atherosclerosis at the skull base.

Skull: No fracture or suspicious osseous lesion. Small right
frontoparietal scalp lipoma.

Sinuses: Mild mucosal thickening in the right maxillary sinus. Clear
mastoid air cells.

Orbits: Unremarkable.

Review of the MIP images confirms the above findings

CTA NECK FINDINGS

Aortic arch: Standard 3 vessel aortic arch with a moderate amount of
mixed calcified and soft plaque. No flow limiting brachiocephalic or
subclavian artery stenosis.

Right carotid system: Unchanged occlusion of the common carotid
artery near its origin with reconstitution at the level of the
carotid bifurcation. Similar appearance of soft and calcified plaque
in the proximal ICA with less than 50% stenosis relative to the more
distal vessel.

Left carotid system: Patent with calcified and soft plaque at the
common carotid artery origin resulting in less than 50% stenosis.
Interval endarterectomy with wide patency of the carotid bifurcation
and ICA.

Vertebral arteries: The vertebral arteries are patent and codominant
with scattered V1 and V2 segment irregularity bilaterally due to
atherosclerotic plaque and degenerative cervical spine spurring. No
flow limiting stenosis or evidence of dissection.

Skeleton: Moderate cervical disc and facet degeneration.

Other neck: No evidence of cervical lymphadenopathy or mass.

Upper chest: Biapical pleuroparenchymal scarring and
mild-to-moderate centrilobular emphysema.

Review of the MIP images confirms the above findings

CTA HEAD FINDINGS

Anterior circulation: The internal carotid arteries are patent from
skull base to carotid termini with mild-to-moderate right and mild
left-sided atherosclerotic plaque but no flow limiting stenosis.
ACAs and MCAs are patent with mild branch vessel irregularity but no
evidence of a proximal branch occlusion or significant proximal
stenosis. No aneurysm is identified.

Posterior circulation: The intracranial vertebral arteries are
widely patent to the basilar. Patent PICA and SCA origins are seen
bilaterally. The basilar artery is widely patent. There is a small
left posterior communicating artery. Both PCAs are patent without
evidence of a significant proximal stenosis. No aneurysm is
identified.

Venous sinuses: Patent.

Anatomic variants: None.

Review of the MIP images confirms the above findings
IMPRESSION: 1. No evidence of acute intracranial abnormality. Chronic left MCA
infarcts.
2. Wide patency of the left carotid system following interval
endarterectomy.
3. Unchanged occlusion of the right common carotid artery near its
origin with reconstitution at the carotid bifurcation.
4. Mild intracranial atherosclerosis without flow limiting proximal
stenosis.
5. Aortic Atherosclerosis (427NM-FYH.H) and Emphysema ([id number]).
